# Patient Record
Sex: Female | Born: 1940 | Race: Black or African American | Hispanic: No | Smoking: Former smoker
Health system: Southern US, Community
[De-identification: ages and names within clinical notes are randomized; demographics above are authoritative.]

## PROBLEM LIST (undated history)

## (undated) DIAGNOSIS — R51 Headache: Secondary | ICD-10-CM

## (undated) DIAGNOSIS — D631 Anemia in chronic kidney disease: Secondary | ICD-10-CM

## (undated) DIAGNOSIS — R42 Dizziness and giddiness: Secondary | ICD-10-CM

## (undated) DIAGNOSIS — I160 Hypertensive urgency: Secondary | ICD-10-CM

## (undated) DIAGNOSIS — I639 Cerebral infarction, unspecified: Secondary | ICD-10-CM

## (undated) DIAGNOSIS — N183 Chronic kidney disease, stage 3 unspecified: Secondary | ICD-10-CM

## (undated) DIAGNOSIS — E1165 Type 2 diabetes mellitus with hyperglycemia: Secondary | ICD-10-CM

## (undated) DIAGNOSIS — I200 Unstable angina: Secondary | ICD-10-CM

## (undated) DIAGNOSIS — G459 Transient cerebral ischemic attack, unspecified: Secondary | ICD-10-CM

## (undated) DIAGNOSIS — R519 Headache, unspecified: Secondary | ICD-10-CM

## (undated) DIAGNOSIS — E669 Obesity, unspecified: Secondary | ICD-10-CM

## (undated) DIAGNOSIS — E1129 Type 2 diabetes mellitus with other diabetic kidney complication: Secondary | ICD-10-CM

## (undated) DIAGNOSIS — C801 Malignant (primary) neoplasm, unspecified: Secondary | ICD-10-CM

## (undated) DIAGNOSIS — N189 Chronic kidney disease, unspecified: Secondary | ICD-10-CM

## (undated) DIAGNOSIS — I679 Cerebrovascular disease, unspecified: Secondary | ICD-10-CM

## (undated) DIAGNOSIS — E7800 Pure hypercholesterolemia, unspecified: Secondary | ICD-10-CM

## (undated) DIAGNOSIS — IMO0002 Reserved for concepts with insufficient information to code with codable children: Secondary | ICD-10-CM

## (undated) DIAGNOSIS — I10 Essential (primary) hypertension: Secondary | ICD-10-CM

## (undated) DIAGNOSIS — Z789 Other specified health status: Secondary | ICD-10-CM

## (undated) DIAGNOSIS — K219 Gastro-esophageal reflux disease without esophagitis: Secondary | ICD-10-CM

## (undated) DIAGNOSIS — E039 Hypothyroidism, unspecified: Secondary | ICD-10-CM

## (undated) HISTORY — PX: CYST EXCISION: SHX5701

## (undated) HISTORY — PX: EYE SURGERY: SHX253

## (undated) HISTORY — PX: COLONOSCOPY: SHX174

## (undated) HISTORY — PX: CARDIAC CATHETERIZATION: SHX172

## (undated) HISTORY — PX: HERNIA REPAIR: SHX51

---

## 2000-08-21 ENCOUNTER — Ambulatory Visit (HOSPITAL_COMMUNITY): Admission: RE | Admit: 2000-08-21 | Discharge: 2000-08-22 | Payer: Self-pay | Admitting: Ophthalmology

## 2000-08-21 ENCOUNTER — Encounter: Payer: Self-pay | Admitting: Ophthalmology

## 2000-11-06 ENCOUNTER — Encounter: Payer: Self-pay | Admitting: Ophthalmology

## 2000-11-06 ENCOUNTER — Ambulatory Visit (HOSPITAL_COMMUNITY): Admission: RE | Admit: 2000-11-06 | Discharge: 2000-11-07 | Payer: Self-pay | Admitting: Ophthalmology

## 2001-01-08 ENCOUNTER — Ambulatory Visit (HOSPITAL_COMMUNITY): Admission: RE | Admit: 2001-01-08 | Discharge: 2001-01-08 | Payer: Self-pay | Admitting: Specialist

## 2013-01-19 ENCOUNTER — Emergency Department (HOSPITAL_BASED_OUTPATIENT_CLINIC_OR_DEPARTMENT_OTHER)
Admission: EM | Admit: 2013-01-19 | Discharge: 2013-01-19 | Disposition: A | Discharge status: Home / Self Care | Payer: Medicare Other | Attending: Emergency Medicine | Admitting: Emergency Medicine

## 2013-01-19 ENCOUNTER — Emergency Department (HOSPITAL_BASED_OUTPATIENT_CLINIC_OR_DEPARTMENT_OTHER): Payer: Medicare Other

## 2013-01-19 ENCOUNTER — Encounter (HOSPITAL_BASED_OUTPATIENT_CLINIC_OR_DEPARTMENT_OTHER): Payer: Self-pay | Admitting: *Deleted

## 2013-01-19 DIAGNOSIS — I10 Essential (primary) hypertension: Secondary | ICD-10-CM | POA: Insufficient documentation

## 2013-01-19 DIAGNOSIS — R739 Hyperglycemia, unspecified: Secondary | ICD-10-CM

## 2013-01-19 DIAGNOSIS — E119 Type 2 diabetes mellitus without complications: Secondary | ICD-10-CM | POA: Insufficient documentation

## 2013-01-19 DIAGNOSIS — R5381 Other malaise: Secondary | ICD-10-CM | POA: Insufficient documentation

## 2013-01-19 DIAGNOSIS — R42 Dizziness and giddiness: Secondary | ICD-10-CM | POA: Insufficient documentation

## 2013-01-19 DIAGNOSIS — R7309 Other abnormal glucose: Secondary | ICD-10-CM | POA: Insufficient documentation

## 2013-01-19 HISTORY — DX: Dizziness and giddiness: R42

## 2013-01-19 LAB — CBC WITH DIFFERENTIAL/PLATELET
Basophils Absolute: 0 10*3/uL (ref 0.0–0.1)
Basophils Relative: 1 % (ref 0–1)
Eosinophils Absolute: 0.2 10*3/uL (ref 0.0–0.7)
Eosinophils Relative: 3 % (ref 0–5)
HCT: 34 % — ABNORMAL LOW (ref 36.0–46.0)
Hemoglobin: 11 g/dL — ABNORMAL LOW (ref 12.0–15.0)
Lymphocytes Relative: 32 % (ref 12–46)
Lymphs Abs: 2 10*3/uL (ref 0.7–4.0)
MCH: 28.1 pg (ref 26.0–34.0)
MCHC: 32.4 g/dL (ref 30.0–36.0)
MCV: 87 fL (ref 78.0–100.0)
Monocytes Absolute: 0.4 10*3/uL (ref 0.1–1.0)
Monocytes Relative: 6 % (ref 3–12)
Neutro Abs: 3.8 10*3/uL (ref 1.7–7.7)
Neutrophils Relative %: 59 % (ref 43–77)
Platelets: 226 10*3/uL (ref 150–400)
RBC: 3.91 MIL/uL (ref 3.87–5.11)
RDW: 13.7 % (ref 11.5–15.5)
WBC: 6.3 10*3/uL (ref 4.0–10.5)

## 2013-01-19 LAB — TROPONIN I: Troponin I: <0.3 ng/mL (ref <0.30)

## 2013-01-19 LAB — COMPREHENSIVE METABOLIC PANEL
ALT: 17 U/L (ref 0–35)
AST: 22 U/L (ref 0–37)
Albumin: 3.4 g/dL — ABNORMAL LOW (ref 3.5–5.2)
Alkaline Phosphatase: 116 U/L (ref 39–117)
BUN: 32 mg/dL — ABNORMAL HIGH (ref 6–23)
CO2: 24 mEq/L (ref 19–32)
Calcium: 9.8 mg/dL (ref 8.4–10.5)
Chloride: 103 mEq/L (ref 96–112)
Creatinine, Ser: 1.5 mg/dL — ABNORMAL HIGH (ref 0.50–1.10)
GFR calc Af Amer: 39 mL/min — ABNORMAL LOW (ref >90)
GFR calc non Af Amer: 34 mL/min — ABNORMAL LOW (ref >90)
Glucose, Bld: 255 mg/dL — ABNORMAL HIGH (ref 70–99)
Potassium: 4.2 mEq/L (ref 3.5–5.1)
Sodium: 138 mEq/L (ref 135–145)
Total Bilirubin: 0.4 mg/dL (ref 0.3–1.2)
Total Protein: 7.4 g/dL (ref 6.0–8.3)

## 2013-01-19 LAB — GLUCOSE, CAPILLARY

## 2013-01-19 MED ORDER — MECLIZINE HCL 25 MG PO TABS
25.0000 mg | ORAL_TABLET | Freq: Once | ORAL | Status: AC
  Administered 2013-01-19: 25 mg via ORAL
  Filled 2013-01-19: qty 1

## 2013-01-19 MED ORDER — MECLIZINE HCL 25 MG PO TABS: 25.0000 mg | ORAL_TABLET | Freq: Three times a day (TID) | ORAL | Status: DC | PRN

## 2013-01-19 NOTE — ED Notes (Signed)
Obtained Arterial Stick for Lab Draw per MD.  Right Arterial Artery used x 1 attempt.  Compression held till no bleeding. Pt tolerated well.  No complications noted.

## 2013-01-19 NOTE — ED Notes (Signed)
Patient states she has been feeling dizzy over the past year, has been taking meclizine but is currently out of the medicine.States she has felt dizzy over the past few days

## 2013-01-19 NOTE — ED Provider Notes (Signed)
CSN: [id number]     Arrival date & time 01/19/13  4098 History   First MD Initiated Contact with Patient 01/19/13 [phone]     Chief Complaint  Patient presents with  . Dizziness   (Consider location/radiation/quality/duration/timing/severity/associated sxs/prior Treatment) HPI Comments: Patient is a 72 year old female with past medical history significant for hypertension, diabetes. She presents with complaints of feeling dizzy for the past year. She states she was seen at that time in [place] and was told she may have had a mini stroke. She has been taking meclizine off and on since that time. She states that the dizziness became worse several days ago and this morning felt as if she was going to pass out. She denies having experienced any chest pain or shortness of breath. She denies headache or stiff neck. There've been no fevers, chills.  Patient is a 72 y.o. female presenting with weakness. The history is provided by the patient.  Weakness This is a new problem. The current episode started more than 1 week ago. The problem occurs constantly. The problem has been gradually worsening. Pertinent negatives include no chest pain and no shortness of breath. Nothing aggravates the symptoms. Nothing relieves the symptoms. She has tried nothing for the symptoms. The treatment provided no relief.    Past Medical History  Diagnosis Date  . Hypertension   . Diabetes mellitus without complication    No past surgical history on file. No family history on file. History  Substance Use Topics  . Smoking status: Not on file  . Smokeless tobacco: Not on file  . Alcohol Use: Not on file   OB History   Grav Para Term Preterm Abortions TAB SAB Ect Mult Living                 Review of Systems  Respiratory: Negative for shortness of breath.   Cardiovascular: Negative for chest pain.  Neurological: Positive for weakness.  All other systems reviewed and are negative.    Allergies  Review of  patient's allergies indicates not on file.  Home Medications  No current outpatient prescriptions on file. There were no vitals taken for this visit. Physical Exam  Nursing note and vitals reviewed. Constitutional: She is oriented to person, place, and time. She appears well-developed and well-nourished. No distress.  HENT:  Head: Normocephalic and atraumatic.  Eyes: EOM are normal. Pupils are equal, round, and reactive to light.  Neck: Normal range of motion. Neck supple.  Cardiovascular: Normal rate and regular rhythm.  Exam reveals no gallop and no friction rub.   No murmur heard. Pulmonary/Chest: Effort normal and breath sounds normal. No respiratory distress. She has no wheezes.  Abdominal: Soft. Bowel sounds are normal. She exhibits no distension. There is no tenderness.  Musculoskeletal: Normal range of motion.  Lymphadenopathy:    She has no cervical adenopathy.  Neurological: She is alert and oriented to person, place, and time. No cranial nerve deficit. She exhibits normal muscle tone. Coordination normal.  Skin: Skin is warm and dry. She is not diaphoretic.    ED Course  Procedures (including critical care time) Labs Review Labs Reviewed - No data to display Imaging Review No results found.   Date: 01/19/2013  Rate: 78  Rhythm: normal sinus rhythm  QRS Axis: left  Intervals: normal  ST/T Wave abnormalities: nonspecific T wave changes  Conduction Disutrbances:none  Narrative Interpretation:   Old EKG Reviewed: changes noted    MDM  No diagnosis found. Patient presents to the  emergency department with complaints of dizziness off and on for the past. She's been on meclizine but is out of this. Workup today reveals no significant abnormalities in her blood work. Her EKG and troponin are unremarkable. Physical examination is unremarkable she is neurologically intact. Her vital signs do reveal an elevated blood pressure however she has not taken her home medications  this morning prior to coming here. Her systolic pressure is high however there is no evidence of endorgan damage and I feel as though it is appropriate for her to take her home medications and monitor this. I doubt her hypertension is the cause of her symptoms, more likely the result of. She will be discharged to home with prescription for meclizine and is to return when necessary if her symptoms worsen or change    Geoffery Lyons, MD 01/19/13 1200

## 2013-10-29 HISTORY — PX: CARDIOVASCULAR STRESS TEST: SHX262

## 2013-12-14 ENCOUNTER — Emergency Department (HOSPITAL_BASED_OUTPATIENT_CLINIC_OR_DEPARTMENT_OTHER): Payer: Medicare Other

## 2013-12-14 ENCOUNTER — Encounter (HOSPITAL_BASED_OUTPATIENT_CLINIC_OR_DEPARTMENT_OTHER): Payer: Self-pay | Admitting: Emergency Medicine

## 2013-12-14 ENCOUNTER — Observation Stay (HOSPITAL_BASED_OUTPATIENT_CLINIC_OR_DEPARTMENT_OTHER)
Admission: EM | Admit: 2013-12-14 | Discharge: 2013-12-17 | Disposition: A | Discharge status: Home / Self Care | Payer: Medicare Other | Attending: Emergency Medicine | Admitting: Emergency Medicine

## 2013-12-14 DIAGNOSIS — I129 Hypertensive chronic kidney disease with stage 1 through stage 4 chronic kidney disease, or unspecified chronic kidney disease: Secondary | ICD-10-CM | POA: Diagnosis not present

## 2013-12-14 DIAGNOSIS — I119 Hypertensive heart disease without heart failure: Secondary | ICD-10-CM

## 2013-12-14 DIAGNOSIS — N183 Chronic kidney disease, stage 3 unspecified: Secondary | ICD-10-CM

## 2013-12-14 DIAGNOSIS — E039 Hypothyroidism, unspecified: Secondary | ICD-10-CM | POA: Diagnosis present

## 2013-12-14 DIAGNOSIS — I200 Unstable angina: Secondary | ICD-10-CM | POA: Diagnosis present

## 2013-12-14 DIAGNOSIS — E785 Hyperlipidemia, unspecified: Secondary | ICD-10-CM | POA: Insufficient documentation

## 2013-12-14 DIAGNOSIS — I160 Hypertensive urgency: Secondary | ICD-10-CM

## 2013-12-14 DIAGNOSIS — E119 Type 2 diabetes mellitus without complications: Secondary | ICD-10-CM | POA: Diagnosis not present

## 2013-12-14 DIAGNOSIS — N189 Chronic kidney disease, unspecified: Secondary | ICD-10-CM

## 2013-12-14 DIAGNOSIS — E1129 Type 2 diabetes mellitus with other diabetic kidney complication: Secondary | ICD-10-CM

## 2013-12-14 DIAGNOSIS — E7800 Pure hypercholesterolemia, unspecified: Secondary | ICD-10-CM

## 2013-12-14 DIAGNOSIS — D631 Anemia in chronic kidney disease: Secondary | ICD-10-CM

## 2013-12-14 DIAGNOSIS — R072 Precordial pain: Secondary | ICD-10-CM

## 2013-12-14 DIAGNOSIS — E1165 Type 2 diabetes mellitus with hyperglycemia: Secondary | ICD-10-CM

## 2013-12-14 DIAGNOSIS — I10 Essential (primary) hypertension: Secondary | ICD-10-CM | POA: Diagnosis present

## 2013-12-14 DIAGNOSIS — R0602 Shortness of breath: Secondary | ICD-10-CM

## 2013-12-14 DIAGNOSIS — IMO0002 Reserved for concepts with insufficient information to code with codable children: Secondary | ICD-10-CM

## 2013-12-14 DIAGNOSIS — R079 Chest pain, unspecified: Secondary | ICD-10-CM | POA: Diagnosis not present

## 2013-12-14 HISTORY — DX: Hypertensive urgency: I16.0

## 2013-12-14 HISTORY — DX: Obesity, unspecified: E66.9

## 2013-12-14 HISTORY — DX: Type 2 diabetes mellitus with other diabetic kidney complication: E11.29

## 2013-12-14 HISTORY — DX: Cerebrovascular disease, unspecified: I67.9

## 2013-12-14 HISTORY — DX: Chronic kidney disease, unspecified: D63.1

## 2013-12-14 HISTORY — DX: Unstable angina: I20.0

## 2013-12-14 HISTORY — DX: Chronic kidney disease, stage 3 (moderate): N18.3

## 2013-12-14 HISTORY — DX: Other specified health status: Z78.9

## 2013-12-14 HISTORY — DX: Pure hypercholesterolemia, unspecified: E78.00

## 2013-12-14 HISTORY — DX: Transient cerebral ischemic attack, unspecified: G45.9

## 2013-12-14 HISTORY — DX: Essential (primary) hypertension: I10

## 2013-12-14 HISTORY — DX: Hypothyroidism, unspecified: E03.9

## 2013-12-14 HISTORY — DX: Chronic kidney disease, stage 3 unspecified: N18.30

## 2013-12-14 HISTORY — DX: Chronic kidney disease, unspecified: N18.9

## 2013-12-14 HISTORY — DX: Reserved for concepts with insufficient information to code with codable children: IMO0002

## 2013-12-14 HISTORY — DX: Type 2 diabetes mellitus with hyperglycemia: E11.65

## 2013-12-14 HISTORY — DX: Gastro-esophageal reflux disease without esophagitis: K21.9

## 2013-12-14 LAB — BASIC METABOLIC PANEL
Anion gap: 12 (ref 5–15)
BUN: 25 mg/dL — AB (ref 6–23)
CALCIUM: 9.9 mg/dL (ref 8.4–10.5)
CO2: 25 meq/L (ref 19–32)
Chloride: 106 mEq/L (ref 96–112)
Creatinine, Ser: 1.7 mg/dL — ABNORMAL HIGH (ref 0.50–1.10)
GFR calc Af Amer: 33 mL/min — ABNORMAL LOW (ref >90)
GFR calc non Af Amer: 29 mL/min — ABNORMAL LOW (ref >90)
GLUCOSE: 123 mg/dL — AB (ref 70–99)
Potassium: 4.3 mEq/L (ref 3.7–5.3)
Sodium: 143 mEq/L (ref 137–147)

## 2013-12-14 LAB — GLUCOSE, CAPILLARY
GLUCOSE-CAPILLARY: 117 mg/dL — AB (ref 70–99)
Glucose-Capillary: 83 mg/dL (ref 70–99)
Glucose-Capillary: 223 mg/dL — ABNORMAL HIGH (ref 70–99)

## 2013-12-14 LAB — TROPONIN I
Troponin I: <0.3 ng/mL (ref <0.30)
Troponin I: <0.3 ng/mL (ref <0.30)
Troponin I: <0.3 ng/mL (ref <0.30)

## 2013-12-14 LAB — CBC
HCT: 34.3 % — ABNORMAL LOW (ref 36.0–46.0)
Hemoglobin: 11 g/dL — ABNORMAL LOW (ref 12.0–15.0)
MCH: 28 pg (ref 26.0–34.0)
MCHC: 32.1 g/dL (ref 30.0–36.0)
MCV: 87.3 fL (ref 78.0–100.0)
Platelets: 276 10*3/uL (ref 150–400)
RBC: 3.93 MIL/uL (ref 3.87–5.11)
RDW: 13.8 % (ref 11.5–15.5)
WBC: 5.8 10*3/uL (ref 4.0–10.5)

## 2013-12-14 MED ORDER — LISINOPRIL 20 MG PO TABS
20.0000 mg | ORAL_TABLET | Freq: Every day | ORAL | Status: DC
  Administered 2013-12-14 – 2013-12-17 (×4): 20 mg via ORAL
  Filled 2013-12-14 (×4): qty 1

## 2013-12-14 MED ORDER — LEVOTHYROXINE SODIUM 88 MCG PO TABS
88.0000 ug | ORAL_TABLET | Freq: Every day | ORAL | Status: DC
  Administered 2013-12-15 – 2013-12-17 (×3): 88 ug via ORAL
  Filled 2013-12-14 (×4): qty 1

## 2013-12-14 MED ORDER — ASPIRIN 81 MG PO CHEW
324.0000 mg | CHEWABLE_TABLET | Freq: Once | ORAL | Status: AC
  Administered 2013-12-14: 324 mg via ORAL

## 2013-12-14 MED ORDER — ONDANSETRON HCL 4 MG PO TABS: 4.0000 mg | ORAL_TABLET | Freq: Four times a day (QID) | ORAL | Status: DC | PRN

## 2013-12-14 MED ORDER — AMLODIPINE BESYLATE 10 MG PO TABS
10.0000 mg | ORAL_TABLET | Freq: Every day | ORAL | Status: DC
  Administered 2013-12-14 – 2013-12-17 (×4): 10 mg via ORAL
  Filled 2013-12-14 (×5): qty 1

## 2013-12-14 MED ORDER — INSULIN ASPART 100 UNIT/ML ~~LOC~~ SOLN
0.0000 [IU] | Freq: Three times a day (TID) | SUBCUTANEOUS | Status: DC
  Administered 2013-12-15: 09:00:00 via SUBCUTANEOUS
  Administered 2013-12-15: 2 [IU] via SUBCUTANEOUS
  Administered 2013-12-16: 3 [IU] via SUBCUTANEOUS
  Administered 2013-12-17: 1 [IU] via SUBCUTANEOUS

## 2013-12-14 MED ORDER — MORPHINE SULFATE 2 MG/ML IJ SOLN: 1.0000 mg | INTRAMUSCULAR | Status: DC | PRN

## 2013-12-14 MED ORDER — ATORVASTATIN CALCIUM 20 MG PO TABS
20.0000 mg | ORAL_TABLET | Freq: Every day | ORAL | Status: DC
  Administered 2013-12-14 – 2013-12-16 (×3): 20 mg via ORAL
  Filled 2013-12-14 (×4): qty 1

## 2013-12-14 MED ORDER — ACETAMINOPHEN 650 MG RE SUPP: 650.0000 mg | Freq: Four times a day (QID) | RECTAL | Status: DC | PRN

## 2013-12-14 MED ORDER — ASPIRIN 81 MG PO CHEW
CHEWABLE_TABLET | ORAL | Status: AC
  Filled 2013-12-14: qty 4

## 2013-12-14 MED ORDER — INSULIN GLARGINE 100 UNIT/ML ~~LOC~~ SOLN
10.0000 [IU] | Freq: Every day | SUBCUTANEOUS | Status: DC
  Administered 2013-12-14 – 2013-12-16 (×3): 10 [IU] via SUBCUTANEOUS
  Filled 2013-12-14 (×3): qty 0.1

## 2013-12-14 MED ORDER — ASPIRIN EC 325 MG PO TBEC
325.0000 mg | DELAYED_RELEASE_TABLET | Freq: Every day | ORAL | Status: DC
  Administered 2013-12-15 – 2013-12-16 (×2): 325 mg via ORAL
  Filled 2013-12-14 (×2): qty 1

## 2013-12-14 MED ORDER — INSULIN ASPART 100 UNIT/ML ~~LOC~~ SOLN
0.0000 [IU] | Freq: Every day | SUBCUTANEOUS | Status: DC
  Administered 2013-12-14: 2 [IU] via SUBCUTANEOUS

## 2013-12-14 MED ORDER — ONDANSETRON HCL 4 MG/2ML IJ SOLN: 4.0000 mg | Freq: Four times a day (QID) | INTRAMUSCULAR | Status: DC | PRN

## 2013-12-14 MED ORDER — NITROGLYCERIN 0.4 MG SL SUBL
0.4000 mg | SUBLINGUAL_TABLET | SUBLINGUAL | Status: AC | PRN
Start: 2013-12-14 — End: 2013-12-14
  Administered 2013-12-14 (×3): 0.4 mg via SUBLINGUAL
  Filled 2013-12-14: qty 1

## 2013-12-14 MED ORDER — ALBUTEROL SULFATE (2.5 MG/3ML) 0.083% IN NEBU: 2.5000 mg | INHALATION_SOLUTION | RESPIRATORY_TRACT | Status: DC | PRN

## 2013-12-14 MED ORDER — ACETAMINOPHEN 325 MG PO TABS: 650.0000 mg | ORAL_TABLET | Freq: Four times a day (QID) | ORAL | Status: DC | PRN

## 2013-12-14 MED ORDER — AMLODIPINE BESYLATE 5 MG PO TABS
5.0000 mg | ORAL_TABLET | Freq: Every day | ORAL | Status: DC
  Filled 2013-12-14: qty 1

## 2013-12-14 MED ORDER — ASPIRIN EC 325 MG PO TBEC: 325.0000 mg | DELAYED_RELEASE_TABLET | Freq: Every day | ORAL | Status: DC

## 2013-12-14 MED ORDER — ENOXAPARIN SODIUM 40 MG/0.4ML ~~LOC~~ SOLN
40.0000 mg | SUBCUTANEOUS | Status: DC
  Filled 2013-12-14 (×3): qty 0.4

## 2013-12-14 MED ORDER — NITROGLYCERIN 0.4 MG SL SUBL: 0.4000 mg | SUBLINGUAL_TABLET | SUBLINGUAL | Status: DC | PRN

## 2013-12-14 NOTE — ED Notes (Signed)
MD at bedside. 

## 2013-12-14 NOTE — Progress Notes (Signed)
Spoke with Dr. Regenia Skeeter about admitting pt from [facility]  73 year old female presented to emergency department with intermittent chest discomfort with radiation to the neck and associated left arm numbness. The patient has had intermittent chest discomfort for several weeks prior to today. The patient has a history of hyperlipidemia, hypertension, diabetes mellitus. EKG revealed inverted T waves in I, aVL. Initial blood pressure upon presentation was 227/100 which improved to 141/107 with nitroglycerin. Initial troponin was negative. Chest x-ray was negative. The nitroglycerin also relieved her chest discomfort. Due to the patient's multiple risk factors and presentation, admission was requested. The patient was accepted for telemetry bed.  Teresa Flowers [phone]

## 2013-12-14 NOTE — ED Notes (Signed)
Patient here with ongoing chest heaviness with radiation to left arm x 1 month. Reports that she got upset with her spouse this am, appears slightly anxious. Has not had her meds this am. No associated symptoms with the CP today.

## 2013-12-14 NOTE — Consult Note (Signed)
Primary Care Physician: Glendon Axe, MD Referring Physician:  [name] is a 73 y.o. female with a h/o HTN, HL, and diabetes who presents with symptoms of chest pain.  She reports having intermittent chest discomfort for about a year.  She describes a "catching" sensation in her chest of moderate intensity at rest, typically lasting several minutes but occasionally up to an hour.  She denies exertional chest pain but does have occasional SOB.  She also has occasional arm/ shoulder pain and today had some numbness in her L arm.  She reports having a myoview at [facility] "a month or so ago" to evaluate these symptoms but has not heard results yet.  She denies diaphoresis, nausea or vomiting.   Today, she denies symptoms of palpitations, orthopnea, PND, lower extremity edema, dizziness, presyncope, syncope, or neurologic sequela. The patient is tolerating medications without difficulties and is otherwise without complaint today.   Past Medical History  Diagnosis Date  . Essential hypertension   . Vertigo   . Hypothyroid   . High cholesterol   . Type 2 diabetes mellitus, uncontrolled, with renal complications   . Anemia in chronic kidney disease   . Stage III chronic kidney disease   . Cerebrovascular disease     prior infarcts seen on head CT per patient  . Obesity    History reviewed. No pertinent past surgical history.  Current Facility-Administered Medications  Medication Dose Route Frequency Provider Last Rate Last Dose  . acetaminophen (TYLENOL) tablet 650 mg  650 mg Oral Q6H PRN Modena Jansky, MD       Or  . acetaminophen (TYLENOL) suppository 650 mg  650 mg Rectal Q6H PRN Modena Jansky, MD      . albuterol (PROVENTIL) (2.5 MG/3ML) 0.083% nebulizer solution 2.5 mg  2.5 mg Nebulization Q2H PRN Modena Jansky, MD      . amLODipine (NORVASC) tablet 5 mg  5 mg Oral Daily Modena Jansky, MD      . Derrill Memo ON 12/15/2013] aspirin EC tablet 325 mg  325 mg Oral  Daily Modena Jansky, MD      . atorvastatin (LIPITOR) tablet 20 mg  20 mg Oral q1800 Modena Jansky, MD      . enoxaparin (LOVENOX) injection 40 mg  40 mg Subcutaneous Q24H Modena Jansky, MD      . insulin aspart (novoLOG) injection 0-5 Units  0-5 Units Subcutaneous QHS Modena Jansky, MD      . insulin aspart (novoLOG) injection 0-9 Units  0-9 Units Subcutaneous TID WC Modena Jansky, MD      . insulin glargine (LANTUS) injection 10 Units  10 Units Subcutaneous QHS Modena Jansky, MD      . Derrill Memo ON 12/15/2013] levothyroxine (SYNTHROID, LEVOTHROID) tablet 88 mcg  88 mcg Oral QAC breakfast Modena Jansky, MD      . lisinopril (PRINIVIL,ZESTRIL) tablet 20 mg  20 mg Oral Daily Modena Jansky, MD      . morphine 2 MG/ML injection 1 mg  1 mg Intravenous Q4H PRN Modena Jansky, MD      . nitroGLYCERIN (NITROSTAT) SL tablet 0.4 mg  0.4 mg Sublingual Q5 min PRN Modena Jansky, MD      . ondansetron (ZOFRAN) tablet 4 mg  4 mg Oral Q6H PRN Modena Jansky, MD       Or  . ondansetron (ZOFRAN) injection 4 mg  4 mg Intravenous Q6H  PRN Modena Jansky, MD        No Known Allergies  History   Social History  . Marital Status: Divorced    Spouse Name: N/A    Number of Children: N/A  . Years of Education: N/A   Occupational History  . Not on file.   Social History Main Topics  . Smoking status: Former Research scientist (life sciences)  . Smokeless tobacco: Not on file  . Alcohol Use: No  . Drug Use: No  . Sexual Activity: Not on file   Other Topics Concern  . Not on file   Social History Narrative  . No narrative on file    Family History  Problem Relation Age of Onset  . Heart disease Mother   . Transient ischemic attack Brother   . Heart attack Daughter     ROS- All systems are reviewed and negative except as per the HPI above  Physical Exam: Filed Vitals:   12/14/13 1230 12/14/13 1300 12/14/13 1330 12/14/13 1441  BP: 141/107 179/101 201/70 176/109  Pulse: 59 58 58 74  Temp:     98.2 F (36.8 C)  TempSrc:    Oral  Resp: 15 15 13 16   Height:    5\' 5"  (1.651 m)  Weight:    225 lb (102.059 kg)  SpO2: 98% 100% 100% 100%    GEN- The patient is overweight appearing, alert and oriented x 3 today.   Head- normocephalic, atraumatic Eyes-  Sclera clear, conjunctiva pink Ears- hearing intact Oropharynx- clear Neck- supple  Lungs- Clear to ausculation bilaterally, normal work of breathing Heart- Regular rate and rhythm, no murmurs, rubs or gallops, PMI not laterally displaced GI- soft, NT, ND, + BS Extremities- no clubbing, cyanosis, or edema MS- no significant deformity or atrophy Skin- no rash or lesion Psych- euthymic mood, full affect Neuro- strength and sensation are intact  EKG today reveals sinus rhythm 57 bpm, PR 200, QRS 90, LVH with LAD, no ischemic changes  Assessment and Plan:  1. Chest pain/ SOB The patient has both typical and atypical symptoms.  She does have multiple CAD risk factors including HTN, HL, DM, prior tobacco, family hx, sedentary lifestyle, and cerebrovascular disease.  She has renal failure also. She had a myoview within the past month.  I have asked the ward clerk to obtain records from [facility] regarding her recent myoview.  This may help [place] better risk stratify the patient. Will cycle cardiac markers on telemetry overnight.  I do not feel that anticoagulation is presently required as she is pain free and ekg is low risk.  IF symptoms recur then heparin could be initiated at that time.  Further decisions regarding risk stratification are deferred at this time (until recent myoview results are available).  Will treat with aggressive medical therapy in the interim.  2. 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  Initiation  of nitrates would also be reasonable given chest pain.   Echo to evaluate given LVH and SOB  3. Acute on chronic renal failure Gentle hydration Aggressive blood pressure control would be beneficial Continue ace inhibitor given DM, HTN and renal disease  4. HL Resume statin  5. DM Per primary team  Cardiology to follow while here

## 2013-12-14 NOTE — H&P (Signed)
History and Physical  Teresa Flowers JJO:[id number] DOB: 1940-05-03 DOA: 12/14/2013  Referring physician: EDP PCP: Glendon Axe, MD  Outpatient Specialists:  1. None  Chief Complaint: Chest pain  HPI: Teresa Flowers is a 73 y.o. female, retired Brewing technologist, with history of type II DM/IDDM with renal complications, hypertension, dyslipidemia, TIA, stage III chronic kidney disease, anemia, vertigo, strong family history of heart disease and diabetes, former smoker, transferred from [facility] to [facility] for complaints of chest pain. Patient states that she has been having left upper chest pains radiating intermittently to the left side of neck for almost a year. However over the last 1 week, she gives h/o daily chest pains, mostly at rest and this morning when she woke up she also noticed "funny sensation in her left upper extremity". Chest pains are described as pressure-like/soreness, moderate intensity without significant relieving or aggravating factors. She gives history of dyspnea on exertion. Denies diaphoresis, nausea or vomiting. Her 75 year old daughter recently had a heart attack and underwent stent. Patient was concerned of a similar situation and presented to the ED. In the ED, troponin x1 negative, chest x-ray without acute findings but EKG showed new T-wave inversions. Blood pressure was markedly elevated which patient attributed to an argument with her spouse but she has not taken her medications today either. Currently without chest pain. Hospitalist admission requested.   Review of Systems: All systems reviewed and apart from history of presenting illness, are negative.  Past Medical History  Diagnosis Date  . Essential hypertension   . Vertigo   . Hypothyroid   . High cholesterol   . Type 2 diabetes mellitus, uncontrolled, with renal complications   . Anemia in chronic kidney disease   . Stage III chronic kidney disease    History  reviewed. No pertinent past surgical history. Social History:  reports that she has quit smoking. She does not have any smokeless tobacco history on file. She reports that she does not drink alcohol or use illicit drugs. Married. Independent of activities of daily living.  No Known Allergies  Family History  Problem Relation Age of Onset  . Heart disease Mother   . Transient ischemic attack Brother   . Heart attack Daughter     Prior to Admission medications   Medication Sig Start Date End Date Taking? Authorizing Provider  amLODipine (NORVASC) 5 MG tablet Take 5 mg by mouth daily.   Yes Historical Provider, MD  Cholecalciferol (VITAMIN D PO) Take by mouth.   Yes Historical Provider, MD  insulin glargine (LANTUS) 100 UNIT/ML injection Inject 10 Units into the skin at bedtime.    Yes Historical Provider, MD  insulin lispro (HUMALOG) 100 UNIT/ML injection Inject 2-6 Units into the skin 3 (three) times daily before meals.    Yes Historical Provider, MD  levothyroxine (SYNTHROID, LEVOTHROID) 88 MCG tablet Take 88 mcg by mouth daily before breakfast.   Yes Historical Provider, MD  lisinopril (PRINIVIL,ZESTRIL) 20 MG tablet Take 20 mg by mouth daily.   Yes Historical Provider, MD  meclizine (ANTIVERT) 25 MG tablet Take 25 mg by mouth 3 (three) times daily as needed for dizziness.   Yes Historical Provider, MD  rosuvastatin (CRESTOR) 10 MG tablet Take 10 mg by mouth daily.   Yes Historical Provider, MD   Physical Exam: Filed Vitals:   12/14/13 1230 12/14/13 1300 12/14/13 1330 12/14/13 1441  BP: 141/107 179/101 201/70 176/109  Pulse: 59 58 58 74  Temp:  98.2 F (36.8 C)  TempSrc:    Oral  Resp: 15 15 13 16   Height:    5\' 5"  (1.651 m)  Weight:    102.059 kg (225 lb)  SpO2: 98% 100% 100% 100%     General exam: Moderately built and nourished pleasant elderly female patient, lying comfortably supine in bed in no obvious distress.  Head, eyes and ENT: Nontraumatic and normocephalic.  Pupils equally reacting to light and accommodation. Oral mucosa moist.  Neck: Supple. No JVD, carotid bruit or thyromegaly.  Lymphatics: No lymphadenopathy.  Respiratory system: Clear to auscultation. No increased work of breathing. Chest pain not definitely reproducible at this time.  Cardiovascular system: S1 and S2 heard, RRR. No JVD, murmurs, gallops, clicks or pedal edema.  Gastrointestinal system: Abdomen is nondistended, soft and nontender. Normal bowel sounds heard. No organomegaly or masses appreciated.  Central nervous system: Alert and oriented. No focal neurological deficits.  Extremities: Symmetric 5 x 5 power. Peripheral pulses symmetrically felt.   Skin: No rashes or acute findings.  Musculoskeletal system: Negative exam.  Psychiatry: Pleasant and cooperative.   Labs on Admission:  Basic Metabolic Panel:  Recent Labs Lab 12/14/13 1120  NA 143  K 4.3  CL 106  CO2 25  GLUCOSE 123*  BUN 25*  CREATININE 1.70*  CALCIUM 9.9   Liver Function Tests: No results found for this basename: AST, ALT, ALKPHOS, BILITOT, PROT, ALBUMIN,  in the last 168 hours No results found for this basename: LIPASE, AMYLASE,  in the last 168 hours No results found for this basename: AMMONIA,  in the last 168 hours CBC:  Recent Labs Lab 12/14/13 1120  WBC 5.8  HGB 11.0*  HCT 34.3*  MCV 87.3  PLT 276   Cardiac Enzymes:  Recent Labs Lab 12/14/13 1120  TROPONINI <0.30    BNP (last 3 results) No results found for this basename: PROBNP,  in the last 8760 hours CBG:  Recent Labs Lab 12/14/13 1452  GLUCAP 83    Radiological Exams on Admission: Dg Chest 2 View  12/14/2013   CLINICAL DATA:  Chest pain  EXAM: CHEST  2 VIEW  COMPARISON:  11/03/2011  FINDINGS: The heart size and mediastinal contours are within normal limits. No acute infiltrate or pulmonary edema. Degenerative changes thoracic spine.  IMPRESSION: No active cardiopulmonary disease.   Electronically Signed    By: Lahoma Crocker M.D.   On: 12/14/2013 11:01    EKG: Unable to see EKG on Epic. As per EDP interpretation-NSR at 78 beats per minute and T waves inverted in 1 and aVL which apparently is new.  Assessment/Plan Principal Problem:   Chest pain Active Problems:   Essential hypertension   Hypothyroid   High cholesterol   Type 2 diabetes mellitus, uncontrolled, with renal complications   Anemia in chronic kidney disease   Stage III chronic kidney disease   1. Chest pain: Has typical and some atypical features for angina. Currently chest pain-free. CAD risk factors-DM, HTN, HLD, obesity, CKD, former smoker and family history. Cycle cardiac enzymes. Continue aspirin. Cardiology consulted for ischemic evaluation. 2. Uncontrolled hypertension: Has not taken medications today. Resume home medications and monitor. May require further titration. 3. Uncontrolled type II DM with renal complications: Continue home dose of Lantus. Add NovoLog SSI. Check A1c. 4. Stage III chronic kidney disease: Creatinine is slightly higher from 1.5 > 1.7 compared to September 2014. Follow BMP. 5. Anemia in chronic kidney disease: Stable. 6. Hypothyroid: Continue Synthroid  Code Status: Full  Family Communication: None at bedside  Disposition Plan: Home when medically stable   Time spent: 4 minutes  Grettel Rames, MD, FACP, FHM. [facility] Pager [phone]  If 7PM-7AM, please contact night-coverage [email or web address] Password [facility] 12/14/2013, 4:32 PM

## 2013-12-14 NOTE — ED Provider Notes (Signed)
CSN: [id number]     Arrival date & time 12/14/13  1941 History   First MD Initiated Contact with Patient 12/14/13 [phone]     Chief Complaint  Patient presents with  . Chest Pain     (Consider location/radiation/quality/duration/timing/severity/associated sxs/prior Treatment) HPI 73 year old female presents with chest pain. Patient is an overall poor historian and seems indicated she's been having chest pain for quite some time. She indicates that it's been going on at least a month. Does not happen every day. She has a general soreness in her left anterior chest that is worse with palpation. That seems to be 24/7. Intermittently 1-2 times per day she get a heaviness in her chest it seems to radiate to her neck and sometimes her arm gets numb. She had that this morning for about an hour. It ended about an hour prior to arrival. Patient states she went to get it checked out because some people are becoming concerned around her period she does endorse worse dyspnea on exertion and fatigue with walking. 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 Denies focal leg swelling or leg pain. No prior cardiac issues. She states her blood pressure is up today because she had an argument with her husband about where to find this facility. She states normally his not 200/100. Her last blood pressure she checked was at her doctor's office and the systolic was 144.  Past Medical History  Diagnosis Date  . Hypertension   . Diabetes mellitus without complication   . Vertigo   . Thyroid disease   . High cholesterol    History reviewed. No pertinent past surgical history. No family history on file. History  Substance Use Topics  . Smoking status: Former Research scientist (life sciences)  . Smokeless tobacco: Not on file  . Alcohol Use: No   OB History   Grav Para Term Preterm Abortions TAB SAB Ect Mult Living                 Review of Systems  Constitutional: Negative for fever.   Respiratory: Positive for shortness of breath.   Cardiovascular: Positive for chest pain. Negative for leg swelling.  Gastrointestinal: Negative for nausea and vomiting.  Musculoskeletal: Positive for neck pain.  Neurological: Positive for numbness.  All other systems reviewed and are negative.     Allergies  Review of patient's allergies indicates no known allergies.  Home Medications   Prior to Admission medications   Medication Sig Start Date End Date Taking? Authorizing Provider  insulin glargine (LANTUS) 100 UNIT/ML injection Inject into the skin at bedtime.   Yes Historical Provider, MD  AMLODIPINE BESYLATE PO Take by mouth.    Historical Provider, MD  Cholecalciferol (VITAMIN D PO) Take by mouth.    Historical Provider, MD  insulin lispro (HUMALOG) 100 UNIT/ML injection Inject into the skin 3 (three) times daily before meals.    Historical Provider, MD  Levothyroxine Sodium (SYNTHROID PO) Take by mouth.    Historical Provider, MD  LISINOPRIL PO Take by mouth.    Historical Provider, MD  meclizine (ANTIVERT) 25 MG tablet Take 1 tablet (25 mg total) by mouth 3 (three) times daily as needed. 01/19/13   Veryl Speak, MD  MECLIZINE HCL PO Take by mouth.    Historical Provider, MD   BP 227/100  Pulse 71  Temp(Src) 99.3 F (37.4 C) (Oral)  Resp 20  Wt 221 lb (100.245 kg)  SpO2  100% Physical Exam  Nursing note and vitals reviewed. Constitutional: She is oriented to person, place, and time. She appears well-developed and well-nourished.  HENT:  Head: Normocephalic and atraumatic.  Right Ear: External ear normal.  Left Ear: External ear normal.  Nose: Nose normal.  Eyes: Right eye exhibits no discharge. Left eye exhibits no discharge.  Cardiovascular: Normal rate, regular rhythm and normal heart sounds.   Pulmonary/Chest: Effort normal and breath sounds normal. She exhibits tenderness (left anterior chest wall tenderness).  Abdominal: Soft. There is no tenderness.   Musculoskeletal: She exhibits no edema and no tenderness.  Neurological: She is alert and oriented to person, place, and time.  Skin: Skin is warm and dry.    ED Course  Procedures (including critical care time) Labs Review Labs Reviewed  CBC - Abnormal; Notable for the following:    Hemoglobin 11.0 (*)    HCT 34.3 (*)    All other components within normal limits  BASIC METABOLIC PANEL - Abnormal; Notable for the following:    Glucose, Bld 123 (*)    BUN 25 (*)    Creatinine, Ser 1.70 (*)    GFR calc non Af Amer 29 (*)    GFR calc Af Amer 33 (*)    All other components within normal limits  TROPONIN I    Imaging Review Dg Chest 2 View  12/14/2013   CLINICAL DATA:  Chest pain  EXAM: CHEST  2 VIEW  COMPARISON:  11/03/2011  FINDINGS: The heart size and mediastinal contours are within normal limits. No acute infiltrate or pulmonary edema. Degenerative changes thoracic spine.  IMPRESSION: No active cardiopulmonary disease.   Electronically Signed   By: Lahoma Crocker M.D.   On: 12/14/2013 11:01    Date: 12/14/2013  Rate: 78  Rhythm: normal sinus rhythm  QRS Axis: normal  Intervals: normal  ST/T Wave abnormalities: T wave inversions 1, AVL  Conduction Disutrbances:none  Narrative Interpretation: T waves inverted I, AVL, previously were flat/nonspecific  Old EKG Reviewed: changes noted   MDM   Final diagnoses:  Chest pain, unspecified chest pain type    Patient's pain is somewhat atypical but she does have intermittent episodes of pressure with radiation to neck and left arm. She's quite hypertensive here, which improved with nitroglycerin. States her pain is resolved. Her EKG shows new T wave inversions in 1 and L. compared to prior borderline changes. Given her risk factors and uncontrolled hypertension will need ACS rule out and observation at [facility]. Discussed with Dr. Carles Collet, who will admit patient and accept in transfer    Ephraim Hamburger, MD 12/14/13 1345

## 2013-12-15 DIAGNOSIS — I119 Hypertensive heart disease without heart failure: Secondary | ICD-10-CM

## 2013-12-15 DIAGNOSIS — I059 Rheumatic mitral valve disease, unspecified: Secondary | ICD-10-CM

## 2013-12-15 DIAGNOSIS — I10 Essential (primary) hypertension: Secondary | ICD-10-CM

## 2013-12-15 LAB — BASIC METABOLIC PANEL
Anion gap: 12 (ref 5–15)
BUN: 25 mg/dL — ABNORMAL HIGH (ref 6–23)
CHLORIDE: 108 meq/L (ref 96–112)
CO2: 22 meq/L (ref 19–32)
Calcium: 9.3 mg/dL (ref 8.4–10.5)
Creatinine, Ser: 1.71 mg/dL — ABNORMAL HIGH (ref 0.50–1.10)
GFR calc Af Amer: 33 mL/min — ABNORMAL LOW (ref >90)
GFR calc non Af Amer: 28 mL/min — ABNORMAL LOW (ref >90)
GLUCOSE: 137 mg/dL — AB (ref 70–99)
POTASSIUM: 4.2 meq/L (ref 3.7–5.3)
Sodium: 142 mEq/L (ref 137–147)

## 2013-12-15 LAB — GLUCOSE, CAPILLARY
GLUCOSE-CAPILLARY: 146 mg/dL — AB (ref 70–99)
GLUCOSE-CAPILLARY: 181 mg/dL — AB (ref 70–99)
GLUCOSE-CAPILLARY: 102 mg/dL — AB (ref 70–99)
Glucose-Capillary: 200 mg/dL — ABNORMAL HIGH (ref 70–99)

## 2013-12-15 LAB — CBC
HCT: 31.4 % — ABNORMAL LOW (ref 36.0–46.0)
HEMOGLOBIN: 10.2 g/dL — AB (ref 12.0–15.0)
MCH: 27.9 pg (ref 26.0–34.0)
MCHC: 32.5 g/dL (ref 30.0–36.0)
MCV: 85.8 fL (ref 78.0–100.0)
Platelets: 270 10*3/uL (ref 150–400)
RBC: 3.66 MIL/uL — ABNORMAL LOW (ref 3.87–5.11)
RDW: 14 % (ref 11.5–15.5)
WBC: 5.7 10*3/uL (ref 4.0–10.5)

## 2013-12-15 LAB — HEMOGLOBIN A1C
Hgb A1c MFr Bld: 8.2 % — ABNORMAL HIGH (ref <5.7)
MEAN PLASMA GLUCOSE: 189 mg/dL — AB (ref <117)

## 2013-12-15 LAB — TROPONIN I: Troponin I: <0.3 ng/mL (ref <0.30)

## 2013-12-15 MED ORDER — SODIUM CHLORIDE 0.45 % IV SOLN
INTRAVENOUS | Status: DC
  Administered 2013-12-15: 09:00:00 via INTRAVENOUS

## 2013-12-15 NOTE — Progress Notes (Signed)
Utilization Review Completed.   Oluwanifemi Petitti, RN, BSN Nurse Case Manager  

## 2013-12-15 NOTE — Progress Notes (Signed)
Patient ID: Teresa Flowers, female   DOB: [birth year], 73 y.o.   MRN: [id number]  Primary Cardiologist Allred  Subjective:  Denies SSCP, palpitations or Dyspnea She is very concerned about her family history of CAD  Objective:  Filed Vitals:   12/14/13 1441 12/14/13 2055 12/14/13 2211 12/15/13 0551  BP: 176/109 186/110 156/72 131/62  Pulse: 74 65  61  Temp: 98.2 F (36.8 C) 98.3 F (36.8 C)  98.2 F (36.8 C)  TempSrc: Oral Oral  Oral  Resp: 16 18  18   Height: 5\' 5"  (1.651 m)     Weight: 225 lb (102.059 kg)   224 lb 13.9 oz (102 kg)  SpO2: 100% 99%  99%    Intake/Output from previous day:  Intake/Output Summary (Last 24 hours) at 12/15/13 6761 Last data filed at 12/15/13 0816  Gross per 24 hour  Intake    240 ml  Output      0 ml  Net    240 ml    Physical Exam: Affect appropriate Overweight black female HEENT: normal Neck supple with no adenopathy JVP normal no bruits no thyromegaly Lungs clear with no wheezing and good diaphragmatic motion Heart:  S1/S2 no murmur, no rub, gallop or click PMI normal Abdomen: benighn, BS positve, no tenderness, no AAA no bruit.  No HSM or HJR Distal pulses intact with no bruits No edema Neuro non-focal Skin warm and dry No muscular weakness   Lab Results: Basic Metabolic Panel:  Recent Labs  12/14/13 1120 12/15/13 0435  NA 143 142  K 4.3 4.2  CL 106 108  CO2 25 22  GLUCOSE 123* 137*  BUN 25* 25*  CREATININE 1.70* 1.71*  CALCIUM 9.9 9.3   CBC:  Recent Labs  12/14/13 1120 12/15/13 0435  WBC 5.8 5.7  HGB 11.0* 10.2*  HCT 34.3* 31.4*  MCV 87.3 85.8  PLT 276 270   Cardiac Enzymes:  Recent Labs  12/14/13 1700 12/14/13 2229 12/15/13 0435  TROPONINI <0.30 <0.30 <0.30    Imaging: Dg Chest 2 View  12/14/2013   CLINICAL DATA:  Chest pain  EXAM: CHEST  2 VIEW  COMPARISON:  11/03/2011  FINDINGS: The heart size and mediastinal contours are within normal limits. No acute infiltrate or pulmonary edema.  Degenerative changes thoracic spine.  IMPRESSION: No active cardiopulmonary disease.   Electronically Signed   By: Lahoma Crocker M.D.   On: 12/14/2013 11:01    Cardiac Studies:  ECG:  SR poor R wave progression LVH   Telemetry:  NSR no arrhythmia  Echo: pending  Medications:   . amLODipine  10 mg Oral Daily  . aspirin EC  325 mg Oral Daily  . atorvastatin  20 mg Oral q1800  . enoxaparin (LOVENOX) injection  40 mg Subcutaneous Q24H  . insulin aspart  0-5 Units Subcutaneous QHS  . insulin aspart  0-9 Units Subcutaneous TID WC  . insulin glargine  10 Units Subcutaneous QHS  . levothyroxine  88 mcg Oral QAC breakfast  . lisinopril  20 mg Oral Daily       Assessment/Plan:  Chest Pain:  See note by Dr Rayann Heman no results yet from myovue done at [facility] a month ago.  Have also asked our PA Katie to look into this although not likely to get results over weekend.  Given recurrent pain and patients concern would have low thresshold to cath.  Will make NPO and hydrate in case she is cathed latter in day Monday  Echo pending  HTN:  Improved continue ACE and amlodipine Chol:  On statin   Jenkins Rouge 12/15/2013, 9:06 AM

## 2013-12-15 NOTE — Progress Notes (Signed)
[facility] PROGRESS NOTE Assessment/Plan: Chest pain: - Atypical features for angina. - Risk factors-DM, HTN, HLD, obesity, CKD, former smoker, she was non complaint with antihypertensive. - consulted cards rec to obtain records from [place] regarding her recent myoview.  - chest pain free.Echo pending for WMA.  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 Add NovoLog SSI. Check A1c.   Stage III chronic kidney disease:  - Creatinine is at baseline (1.5-1.7).  Anemia in chronic kidney disease:  - Stable.   Hypothyroid:  - Continue Synthroid    Code Status: Full  Family Communication: None at bedside  Disposition Plan: Home when medically stable    Consultants:  cardiology  Procedures:  Echo pedning  Antibiotics:  none  HPI/Subjective: Chest pain free want to go home.  Objective: Filed Vitals:   12/14/13 1441 12/14/13 2055 12/14/13 2211 12/15/13 0551  BP: 176/109 186/110 156/72 131/62  Pulse: 74 65  61  Temp: 98.2 F (36.8 C) 98.3 F (36.8 C)  98.2 F (36.8 C)  TempSrc: Oral Oral  Oral  Resp: 16 18  18   Height: 5\' 5"  (1.651 m)     Weight: 102.059 kg (225 lb)   102 kg (224 lb 13.9 oz)  SpO2: 100% 99%  99%    Intake/Output Summary (Last 24 hours) at 12/15/13 0803 Last data filed at 12/14/13 2300  Gross per 24 hour  Intake    120 ml  Output      0 ml  Net    120 ml   Filed Weights   12/14/13 0940 12/14/13 1441 12/15/13 0551  Weight: 100.245 kg (221 lb) 102.059 kg (225 lb) 102 kg (224 lb 13.9 oz)    Exam:  General: Alert, awake, oriented x3, in no acute distress.  HEENT: No bruits, no goiter.  Heart: Regular rate and rhythm. Lungs: Good air movement, clear Abdomen: Soft, nontender, nondistended, positive bowel sounds.    Data Reviewed: Basic Metabolic Panel:  Recent Labs Lab  12/14/13 1120 12/15/13 0435  NA 143 142  K 4.3 4.2  CL 106 108  CO2 25 22  GLUCOSE 123* 137*  BUN 25* 25*  CREATININE 1.70* 1.71*  CALCIUM 9.9 9.3   Liver Function Tests: No results found for this basename: AST, ALT, ALKPHOS, BILITOT, PROT, ALBUMIN,  in the last 168 hours No results found for this basename: LIPASE, AMYLASE,  in the last 168 hours No results found for this basename: AMMONIA,  in the last 168 hours CBC:  Recent Labs Lab 12/14/13 1120 12/15/13 0435  WBC 5.8 5.7  HGB 11.0* 10.2*  HCT 34.3* 31.4*  MCV 87.3 85.8  PLT 276 270   Cardiac Enzymes:  Recent Labs Lab 12/14/13 1120 12/14/13 1700 12/14/13 2229 12/15/13 0435  TROPONINI <0.30 <0.30 <0.30 <0.30   BNP (last 3 results) No results found for this basename: PROBNP,  in the last 8760 hours CBG:  Recent Labs Lab 12/14/13 1452 12/14/13 1641 12/14/13 2052 12/15/13 0717  GLUCAP 83 117* 223* 146*    No results found for this or any previous visit (from the past 240 hour(s)).   Studies: Dg Chest 2 View  12/14/2013   CLINICAL DATA:  Chest pain  EXAM: CHEST  2 VIEW  COMPARISON:  11/03/2011  FINDINGS: The heart size and mediastinal contours are within normal limits. No acute infiltrate or pulmonary  edema. Degenerative changes thoracic spine.  IMPRESSION: No active cardiopulmonary disease.   Electronically Signed   By: Lahoma Crocker M.D.   On: 12/14/2013 11:01    Scheduled Meds: . amLODipine  10 mg Oral Daily  . aspirin EC  325 mg Oral Daily  . atorvastatin  20 mg Oral q1800  . enoxaparin (LOVENOX) injection  40 mg Subcutaneous Q24H  . insulin aspart  0-5 Units Subcutaneous QHS  . insulin aspart  0-9 Units Subcutaneous TID WC  . insulin glargine  10 Units Subcutaneous QHS  . levothyroxine  88 mcg Oral QAC breakfast  . lisinopril  20 mg Oral Daily   Continuous Infusions:    Charlynne Cousins  [facility] Pager [phone]. If 8PM-8AM, please contact night-coverage at [email or web address], password  [facility] 12/15/2013, 8:03 AM  LOS: 1 day      **Disclaimer: This note may have been dictated with voice recognition software. Similar sounding words can inadvertently be transcribed and this note may contain transcription errors which may not have been corrected upon publication of note.**

## 2013-12-15 NOTE — Progress Notes (Signed)
Echo Lab  2D Echocardiogram completed.  [place], RDCS 12/15/2013 10:39 AM

## 2013-12-16 ENCOUNTER — Encounter (HOSPITAL_COMMUNITY): Payer: Self-pay | Admitting: Cardiology

## 2013-12-16 ENCOUNTER — Encounter (HOSPITAL_COMMUNITY)
Admission: EM | Disposition: A | Discharge status: Home / Self Care | Payer: Self-pay | Source: Home / Self Care | Attending: Emergency Medicine

## 2013-12-16 DIAGNOSIS — E785 Hyperlipidemia, unspecified: Secondary | ICD-10-CM

## 2013-12-16 DIAGNOSIS — I160 Hypertensive urgency: Secondary | ICD-10-CM

## 2013-12-16 DIAGNOSIS — I200 Unstable angina: Secondary | ICD-10-CM

## 2013-12-16 HISTORY — PX: LEFT HEART CATHETERIZATION WITH CORONARY ANGIOGRAM: SHX5451

## 2013-12-16 HISTORY — DX: Hypertensive urgency: I16.0

## 2013-12-16 LAB — PROTIME-INR
INR: 0.99 (ref 0.00–1.49)
Prothrombin Time: 13.1 seconds (ref 11.6–15.2)

## 2013-12-16 LAB — GLUCOSE, CAPILLARY
GLUCOSE-CAPILLARY: 238 mg/dL — AB (ref 70–99)
Glucose-Capillary: 126 mg/dL — ABNORMAL HIGH (ref 70–99)
Glucose-Capillary: 106 mg/dL — ABNORMAL HIGH (ref 70–99)
Glucose-Capillary: 125 mg/dL — ABNORMAL HIGH (ref 70–99)

## 2013-12-16 SURGERY — LEFT HEART CATHETERIZATION WITH CORONARY ANGIOGRAM
Anesthesia: LOCAL

## 2013-12-16 MED ORDER — ENOXAPARIN SODIUM 40 MG/0.4ML ~~LOC~~ SOLN
40.0000 mg | SUBCUTANEOUS | Status: DC
  Filled 2013-12-16: qty 0.4

## 2013-12-16 MED ORDER — FENTANYL CITRATE 0.05 MG/ML IJ SOLN
INTRAMUSCULAR | Status: AC
  Filled 2013-12-16: qty 2

## 2013-12-16 MED ORDER — SODIUM CHLORIDE 0.9 % IJ SOLN: 3.0000 mL | INTRAMUSCULAR | Status: DC | PRN

## 2013-12-16 MED ORDER — HEPARIN SODIUM (PORCINE) 1000 UNIT/ML IJ SOLN
INTRAMUSCULAR | Status: AC
  Filled 2013-12-16: qty 1

## 2013-12-16 MED ORDER — ASPIRIN 81 MG PO CHEW
81.0000 mg | CHEWABLE_TABLET | Freq: Every day | ORAL | Status: DC
  Administered 2013-12-17: 81 mg via ORAL
  Filled 2013-12-16: qty 1

## 2013-12-16 MED ORDER — VERAPAMIL HCL 2.5 MG/ML IV SOLN
INTRAVENOUS | Status: AC
  Filled 2013-12-16: qty 2

## 2013-12-16 MED ORDER — LIDOCAINE HCL (PF) 1 % IJ SOLN
INTRAMUSCULAR | Status: AC
  Filled 2013-12-16: qty 30

## 2013-12-16 MED ORDER — ONDANSETRON HCL 4 MG/2ML IJ SOLN: 4.0000 mg | Freq: Four times a day (QID) | INTRAMUSCULAR | Status: DC | PRN

## 2013-12-16 MED ORDER — SODIUM CHLORIDE 0.9 % IV SOLN: INTRAVENOUS | Status: AC

## 2013-12-16 MED ORDER — NITROGLYCERIN 1 MG/10 ML FOR IR/CATH LAB
INTRA_ARTERIAL | Status: AC
  Filled 2013-12-16: qty 10

## 2013-12-16 MED ORDER — ACETAMINOPHEN 325 MG PO TABS
650.0000 mg | ORAL_TABLET | ORAL | Status: DC | PRN
Start: 2013-12-16 — End: 2013-12-17

## 2013-12-16 MED ORDER — SODIUM CHLORIDE 0.9 % IV SOLN: 250.0000 mL | INTRAVENOUS | Status: DC | PRN

## 2013-12-16 MED ORDER — SODIUM CHLORIDE 0.9 % IJ SOLN: 3.0000 mL | Freq: Two times a day (BID) | INTRAMUSCULAR | Status: DC

## 2013-12-16 MED ORDER — HEPARIN (PORCINE) IN NACL 2-0.9 UNIT/ML-% IJ SOLN
INTRAMUSCULAR | Status: AC
  Filled 2013-12-16: qty 1500

## 2013-12-16 MED ORDER — MIDAZOLAM HCL 2 MG/2ML IJ SOLN
INTRAMUSCULAR | Status: AC
  Filled 2013-12-16: qty 2

## 2013-12-16 NOTE — Progress Notes (Signed)
Right radial TR band removed without complications.  2x2 and tegaderm applied.  Radial site level 0.  Patient given radial site instructions.  Sanda Linger

## 2013-12-16 NOTE — Progress Notes (Signed)
[facility] PROGRESS NOTE Assessment/Plan: Chest pain: - Risk factors-DM, HTN, HLD, obesity, CKD, former smoker, she was non complaint with antihypertensive. - Consulted cards. appriciated assistance. - Chest pain free.EchoSystolic function was normal. The estimated ejection fraction was in the range of 55% to 60%. Wall motion was normal; there were no regional wall motion abnormalities.  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 Add NovoLog SSI. Check A1c.   Stage III chronic kidney disease:  - Creatinine is at baseline (1.5-1.7).  Anemia in chronic kidney disease:  - Stable.   Hypothyroid:  - Continue Synthroid    Code Status: Full  Family Communication: None at bedside  Disposition Plan: Home when medically stable    Consultants:  cardiology  Procedures:  Echo as above.  Antibiotics:  none  HPI/Subjective: Chest pain free.  Objective: Filed Vitals:   12/15/13 0900 12/15/13 1347 12/15/13 2100 12/16/13 0500  BP: 147/68 155/63 151/59 152/70  Pulse: 89 64 80 58  Temp:  99.2 F (37.3 C) 97.9 F (36.6 C) 98.7 F (37.1 C)  TempSrc:  Oral Oral Oral  Resp:  17 18 18   Height:      Weight:      SpO2:  100% 100% 100%    Intake/Output Summary (Last 24 hours) at 12/16/13 0813 Last data filed at 12/16/13 0653  Gross per 24 hour  Intake 1742.5 ml  Output      0 ml  Net 1742.5 ml   Filed Weights   12/14/13 0940 12/14/13 1441 12/15/13 0551  Weight: 100.245 kg (221 lb) 102.059 kg (225 lb) 102 kg (224 lb 13.9 oz)    Exam:  General: Alert, awake, oriented x3, in no acute distress.  HEENT: No bruits, no goiter.  Heart: Regular rate and rhythm. Lungs: Good air movement, clear Abdomen: Soft, nontender, nondistended, positive bowel sounds.    Data Reviewed: Basic Metabolic Panel:  Recent Labs Lab  12/14/13 1120 12/15/13 0435  NA 143 142  K 4.3 4.2  CL 106 108  CO2 25 22  GLUCOSE 123* 137*  BUN 25* 25*  CREATININE 1.70* 1.71*  CALCIUM 9.9 9.3   Liver Function Tests: No results found for this basename: AST, ALT, ALKPHOS, BILITOT, PROT, ALBUMIN,  in the last 168 hours No results found for this basename: LIPASE, AMYLASE,  in the last 168 hours No results found for this basename: AMMONIA,  in the last 168 hours CBC:  Recent Labs Lab 12/14/13 1120 12/15/13 0435  WBC 5.8 5.7  HGB 11.0* 10.2*  HCT 34.3* 31.4*  MCV 87.3 85.8  PLT 276 270   Cardiac Enzymes:  Recent Labs Lab 12/14/13 1120 12/14/13 1700 12/14/13 2229 12/15/13 0435  TROPONINI <0.30 <0.30 <0.30 <0.30   BNP (last 3 results) No results found for this basename: PROBNP,  in the last 8760 hours CBG:  Recent Labs Lab 12/15/13 0717 12/15/13 1132 12/15/13 1710 12/15/13 2049 12/16/13 0743  GLUCAP 146* 181* 102* 200* 126*    No results found for this or any previous visit (from the past 240 hour(s)).   Studies: Dg Chest 2 View  12/14/2013   CLINICAL DATA:  Chest pain  EXAM: CHEST  2 VIEW  COMPARISON:  11/03/2011  FINDINGS: The heart size and mediastinal contours are within normal limits. No acute infiltrate or pulmonary edema. Degenerative changes thoracic spine.  IMPRESSION: No  active cardiopulmonary disease.   Electronically Signed   By: Lahoma Crocker M.D.   On: 12/14/2013 11:01    Scheduled Meds: . amLODipine  10 mg Oral Daily  . aspirin EC  325 mg Oral Daily  . atorvastatin  20 mg Oral q1800  . enoxaparin (LOVENOX) injection  40 mg Subcutaneous Q24H  . insulin aspart  0-5 Units Subcutaneous QHS  . insulin aspart  0-9 Units Subcutaneous TID WC  . insulin glargine  10 Units Subcutaneous QHS  . levothyroxine  88 mcg Oral QAC breakfast  . lisinopril  20 mg Oral Daily   Continuous Infusions: . sodium chloride 75 mL/hr at 12/15/13 0915     Charlynne Cousins  [facility] Pager [phone].  If 8PM-8AM, please contact night-coverage at [email or web address], password [facility] 12/16/2013, 8:13 AM  LOS: 2 days      **Disclaimer: This note may have been dictated with voice recognition software. Similar sounding words can inadvertently be transcribed and this note may contain transcription errors which may not have been corrected upon publication of note.**

## 2013-12-16 NOTE — CV Procedure (Signed)
       PROCEDURE:  Left heart catheterization with selective coronary angiography, left ventriculogram.  INDICATIONS:  Unstable angina  The risks, benefits, and details of the procedure were explained to the patient.  The patient verbalized understanding and wanted to proceed.  Informed written consent was obtained.  PROCEDURE TECHNIQUE:  After Xylocaine anesthesia a 55F slender sheath was placed in the right radial artery with a single anterior needle wall stick.  IV heparin was given. Right coronary angiography was done using a Judkins R4 guide catheter.  Left coronary angiography was done using a Judkins L3.5 guide catheter.  Left ventriculography was done using a pigtail catheter.  A TR band was used for hemostasis.   CONTRAST:  Total of 25 cc.  COMPLICATIONS:  None.    HEMODYNAMICS:  Aortic pressure was 122/69; LV pressure was 118/4; LVEDP 7.  There was no gradient between the left ventricle and aorta.    ANGIOGRAPHIC DATA:   The left main coronary artery is short, but widely patent.  The left anterior descending artery is a large vessel which wraps around the apex. There is mild disease in the proximal to mid vessel. There several medium-size diagonals which are all widely patent.  The left circumflex artery is a large vessel. There is a large ramus vessel which is patent. The circumflex has a large first obtuse marginal. There is mild disease in the midportion of this vessel. The remainder of the circumflex is small.  The right coronary artery is a large dominant vessel. There are mild, luminal irregularities throughout the RCA. There is a focal area of moderate disease in the distal RCA. The posterior lateral artery is large and patent. The posterior descending artery is also large and patent.  LEFT VENTRICULOGRAM:  Left ventricular angiogram was not done.  LVEDP was 7 mmHg.  IMPRESSIONS:  1. Normal left main coronary artery. 2. 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 3. Mild disease in the left circumflex artery and its branches. 4. Mild disease in the right coronary artery as noted above. 5. Left ventricular systolic function was not assessed.  LVEDP 7 mmHg.    RECOMMENDATION:  Continue preventative therapy. She needs aggressive risk factor modification. Minimal contrast dye was used. We will hydrate her post procedure to help preserve renal function.  Cardiology followup with Dr. Johnsie Cancel.

## 2013-12-16 NOTE — Interval H&P Note (Signed)
Cath Lab Visit (complete for each Cath Lab visit)  Clinical Evaluation Leading to the Procedure:   ACS: Yes.    Non-ACS:    Anginal Classification: CCS IV  Anti-ischemic medical therapy: Maximal Therapy (2 or more classes of medications)  Non-Invasive Test Results: Intermediate-risk stress test findings: cardiac mortality 1-3%/year  Prior CABG: No previous CABG      History and Physical Interval Note:  12/16/2013 12:58 PM  Teresa Flowers  has presented today for surgery, with the diagnosis of cp  The various methods of treatment have been discussed with the patient and family. After consideration of risks, benefits and other options for treatment, the patient has consented to  Procedure(s): LEFT HEART CATHETERIZATION WITH CORONARY ANGIOGRAM (N/A) as a surgical intervention .  The patient's history has been reviewed, patient examined, no change in status, stable for surgery.  I have reviewed the patient's chart and labs.  Questions were answered to the patient's satisfaction.     Feliz Lincoln S.

## 2013-12-16 NOTE — H&P (View-Only) (Signed)
Subjective: No further pain- has brief episodes of chest pressure  Nuc study 10/29/13 adenosine myoview, with mildly abnormal with moderate fixed inferior attenuation artifact vs. Transmural scar.  EF 53% without regional wall motion abnormalities.   Objective: Vital signs in last 24 hours: Temp:  [97.9 F (36.6 C)-99.2 F (37.3 C)] 98.7 F (37.1 C) (08/24 0500) Pulse Rate:  [58-80] 58 (08/24 0500) Resp:  [17-18] 18 (08/24 0500) BP: (151-155)/(59-70) 152/70 mmHg (08/24 0500) SpO2:  [100 %] 100 % (08/24 0500) Weight change:  Last BM Date: 12/13/13 Intake/Output from previous day: +1742 08/23 0701 - 08/24 0700 In: 1742.5 [P.O.:120; I.V.:1622.5] Out: -  Intake/Output this shift:    PE: General:Pleasant affect, NAD Skin:Warm and dry, brisk capillary refill HEENT:normocephalic, sclera clear, mucus membranes moist Neck:supple, no JVD   Heart:S1S2 RRR without murmur, gallup, rub or click Lungs:clear without rales, rhonchi, or wheezes POE:UMPN, non tender, + BS, do not palpate liver spleen or masses Ext:no lower ext edema, 2+ pedal pulses, 2+ radial pulses Neuro:alert and oriented, MAE, follows commands, + facial symmetry   Lab Results:  Recent Labs  12/14/13 1120 12/15/13 0435  WBC 5.8 5.7  HGB 11.0* 10.2*  HCT 34.3* 31.4*  PLT 276 270   BMET  Recent Labs  12/14/13 1120 12/15/13 0435  NA 143 142  K 4.3 4.2  CL 106 108  CO2 25 22  GLUCOSE 123* 137*  BUN 25* 25*  CREATININE 1.70* 1.71*  CALCIUM 9.9 9.3    Recent Labs  12/14/13 2229 12/15/13 0435  TROPONINI <0.30 <0.30    No results found for this basename: CHOL, HDL, LDLCALC, LDLDIRECT, TRIG, CHOLHDL   Lab Results  Component Value Date   HGBA1C 8.2* 12/14/2013     No results found for this basename: TSH     Studies/Results: 2D echo: Study Conclusions - Left ventricle: Basal septal hypertrophy with sigmoid shaped septum. The cavity size was normal. Systolic function was normal. The  estimated ejection fraction was in the range of 55% to 60%. Wall motion was normal; there were no regional wall motion abnormalities. - Mitral valve: There was mild regurgitation. - Atrial septum: No defect or patent foramen ovale was identified. Impressions: - Normal study.    Dg Chest 2 View  12/14/2013   CLINICAL DATA:  Chest pain  EXAM: CHEST  2 VIEW  COMPARISON:  11/03/2011  FINDINGS: The heart size and mediastinal contours are within normal limits. No acute infiltrate or pulmonary edema. Degenerative changes thoracic spine.  IMPRESSION: No active cardiopulmonary disease.   Electronically Signed   By: Lahoma Crocker M.D.   On: 12/14/2013 11:01    Medications: I have reviewed the patient's current medications. Scheduled Meds: . amLODipine  10 mg Oral Daily  . aspirin EC  325 mg Oral Daily  . atorvastatin  20 mg Oral q1800  . enoxaparin (LOVENOX) injection  40 mg Subcutaneous Q24H  . insulin aspart  0-5 Units Subcutaneous QHS  . insulin aspart  0-9 Units Subcutaneous TID WC  . insulin glargine  10 Units Subcutaneous QHS  . levothyroxine  88 mcg Oral QAC breakfast  . lisinopril  20 mg Oral Daily   Continuous Infusions: . sodium chloride 75 mL/hr at 12/15/13 0915   PRN Meds:.acetaminophen, acetaminophen, albuterol, morphine injection, nitroGLYCERIN, ondansetron (ZOFRAN) IV, ondansetron  Assessment/Plan: 73 y.o. female with a h/o HTN, HL, and diabetes who presents with symptoms of chest pain She reports having [facility] "a month or so ago" to evaluate these symptoms but has not heard results yet.     Principal Problem:   Chest pain/ SOB - negative MI, recent mildly abnormal nuc study done 10/29/13, may be prudent to proceed with cath to determine her disease. Multiple risk factors, including FH -her daughter rec'd cardiac stent last week with MI--fluids at 75 cc hr. Active Problems:   Hypertensive urgency- decreased- improved    Essential hypertension   Hypothyroid    High cholesterol   Type 2 diabetes mellitus, uncontrolled, with renal complications- hgb U2G 8.2   Anemia in chronic kidney disease   Stage III chronic kidney disease- cr. 1.71-- 11 mos ago was 1.50    Hx of CVA   LOS: 2 days   Time spent with pt. :15 minutes. [facility] R  Nurse Practitioner Certified Pager [phone] or after 5pm and on weekends call [phone] 12/16/2013, 9:29 AM   Patient seen, examined. Available data reviewed. Agree with findings, assessment, and plan as outlined by Cecilie Kicks, NP. Pt with recurrent cheat pain with typical and atypical features. Enzymes and EKG negative. Abnormal Myoview last month as above with moderate fixed inferior perfusion defect. In my opinion, with an abnormal Myoview, recurrence of CP, 40+ years of diabetes, and strong family hx of CAD (she states 'everyone' in her family has had an MI or stroke), would favor cath and possible PCI. Reviewed risks, indications, alternatives with the patient. She understands and agrees to proceed. Note creatinine is 1.7 mg/dL. This is stable and she has been hydrated overnight. Advised her that today's study would likely be diagnostic only with limited dye to preserve renal fxn. She understands.  Sherren Mocha, M.D. 12/16/2013 10:58 AM

## 2013-12-16 NOTE — Progress Notes (Signed)
Subjective: No further pain- has brief episodes of chest pressure  Nuc study 10/29/13 adenosine myoview, with mildly abnormal with moderate fixed inferior attenuation artifact vs. Transmural scar.  EF 53% without regional wall motion abnormalities.   Objective: Vital signs in last 24 hours: Temp:  [97.9 F (36.6 C)-99.2 F (37.3 C)] 98.7 F (37.1 C) (08/24 0500) Pulse Rate:  [58-80] 58 (08/24 0500) Resp:  [17-18] 18 (08/24 0500) BP: (151-155)/(59-70) 152/70 mmHg (08/24 0500) SpO2:  [100 %] 100 % (08/24 0500) Weight change:  Last BM Date: 12/13/13 Intake/Output from previous day: +1742 08/23 0701 - 08/24 0700 In: 1742.5 [P.O.:120; I.V.:1622.5] Out: -  Intake/Output this shift:    PE: General:Pleasant affect, NAD Skin:Warm and dry, brisk capillary refill HEENT:normocephalic, sclera clear, mucus membranes moist Neck:supple, no JVD   Heart:S1S2 RRR without murmur, gallup, rub or click Lungs:clear without rales, rhonchi, or wheezes BOF:BPZW, non tender, + BS, do not palpate liver spleen or masses Ext:no lower ext edema, 2+ pedal pulses, 2+ radial pulses Neuro:alert and oriented, MAE, follows commands, + facial symmetry   Lab Results:  Recent Labs  12/14/13 1120 12/15/13 0435  WBC 5.8 5.7  HGB 11.0* 10.2*  HCT 34.3* 31.4*  PLT 276 270   BMET  Recent Labs  12/14/13 1120 12/15/13 0435  NA 143 142  K 4.3 4.2  CL 106 108  CO2 25 22  GLUCOSE 123* 137*  BUN 25* 25*  CREATININE 1.70* 1.71*  CALCIUM 9.9 9.3    Recent Labs  12/14/13 2229 12/15/13 0435  TROPONINI <0.30 <0.30    No results found for this basename: CHOL, HDL, LDLCALC, LDLDIRECT, TRIG, CHOLHDL   Lab Results  Component Value Date   HGBA1C 8.2* 12/14/2013     No results found for this basename: TSH     Studies/Results: 2D echo: Study Conclusions - Left ventricle: Basal septal hypertrophy with sigmoid shaped septum. The cavity size was normal. Systolic function was normal. The  estimated ejection fraction was in the range of 55% to 60%. Wall motion was normal; there were no regional wall motion abnormalities. - Mitral valve: There was mild regurgitation. - Atrial septum: No defect or patent foramen ovale was identified. Impressions: - Normal study.    Dg Chest 2 View  12/14/2013   CLINICAL DATA:  Chest pain  EXAM: CHEST  2 VIEW  COMPARISON:  11/03/2011  FINDINGS: The heart size and mediastinal contours are within normal limits. No acute infiltrate or pulmonary edema. Degenerative changes thoracic spine.  IMPRESSION: No active cardiopulmonary disease.   Electronically Signed   By: Lahoma Crocker M.D.   On: 12/14/2013 11:01    Medications: I have reviewed the patient's current medications. Scheduled Meds: . amLODipine  10 mg Oral Daily  . aspirin EC  325 mg Oral Daily  . atorvastatin  20 mg Oral q1800  . enoxaparin (LOVENOX) injection  40 mg Subcutaneous Q24H  . insulin aspart  0-5 Units Subcutaneous QHS  . insulin aspart  0-9 Units Subcutaneous TID WC  . insulin glargine  10 Units Subcutaneous QHS  . levothyroxine  88 mcg Oral QAC breakfast  . lisinopril  20 mg Oral Daily   Continuous Infusions: . sodium chloride 75 mL/hr at 12/15/13 0915   PRN Meds:.acetaminophen, acetaminophen, albuterol, morphine injection, nitroGLYCERIN, ondansetron (ZOFRAN) IV, ondansetron  Assessment/Plan: 73 y.o. female with a h/o HTN, HL, and diabetes who presents with symptoms of chest pain She reports having [facility] "a month or so ago" to evaluate these symptoms but has not heard results yet.     Principal Problem:   Chest pain/ SOB - negative MI, recent mildly abnormal nuc study done 10/29/13, may be prudent to proceed with cath to determine her disease. Multiple risk factors, including FH -her daughter rec'd cardiac stent last week with MI--fluids at 75 cc hr. Active Problems:   Hypertensive urgency- decreased- improved    Essential hypertension   Hypothyroid    High cholesterol   Type 2 diabetes mellitus, uncontrolled, with renal complications- hgb T6O 8.2   Anemia in chronic kidney disease   Stage III chronic kidney disease- cr. 1.71-- 11 mos ago was 1.50    Hx of CVA   LOS: 2 days   Time spent with pt. :15 minutes. [facility] R  Nurse Practitioner Certified Pager [phone] or after 5pm and on weekends call [phone] 12/16/2013, 9:29 AM   Patient seen, examined. Available data reviewed. Agree with findings, assessment, and plan as outlined by Cecilie Kicks, NP. Pt with recurrent cheat pain with typical and atypical features. Enzymes and EKG negative. Abnormal Myoview last month as above with moderate fixed inferior perfusion defect. In my opinion, with an abnormal Myoview, recurrence of CP, 40+ years of diabetes, and strong family hx of CAD (she states 'everyone' in her family has had an MI or stroke), would favor cath and possible PCI. Reviewed risks, indications, alternatives with the patient. She understands and agrees to proceed. Note creatinine is 1.7 mg/dL. This is stable and she has been hydrated overnight. Advised her that today's study would likely be diagnostic only with limited dye to preserve renal fxn. She understands.  Sherren Mocha, M.D. 12/16/2013 10:58 AM

## 2013-12-17 DIAGNOSIS — N039 Chronic nephritic syndrome with unspecified morphologic changes: Secondary | ICD-10-CM

## 2013-12-17 DIAGNOSIS — N189 Chronic kidney disease, unspecified: Secondary | ICD-10-CM

## 2013-12-17 DIAGNOSIS — D631 Anemia in chronic kidney disease: Secondary | ICD-10-CM

## 2013-12-17 LAB — BASIC METABOLIC PANEL
ANION GAP: 10 (ref 5–15)
Anion gap: 13 (ref 5–15)
BUN: 32 mg/dL — ABNORMAL HIGH (ref 6–23)
BUN: 31 mg/dL — ABNORMAL HIGH (ref 6–23)
CALCIUM: 9.2 mg/dL (ref 8.4–10.5)
CO2: 18 meq/L — AB (ref 19–32)
CO2: 24 mEq/L (ref 19–32)
CREATININE: 1.62 mg/dL — AB (ref 0.50–1.10)
Calcium: 8.8 mg/dL (ref 8.4–10.5)
Chloride: 108 mEq/L (ref 96–112)
Chloride: 108 mEq/L (ref 96–112)
Creatinine, Ser: 1.58 mg/dL — ABNORMAL HIGH (ref 0.50–1.10)
GFR calc Af Amer: 36 mL/min — ABNORMAL LOW (ref >90)
GFR calc Af Amer: 35 mL/min — ABNORMAL LOW (ref >90)
GFR calc non Af Amer: 31 mL/min — ABNORMAL LOW (ref >90)
GFR calc non Af Amer: 30 mL/min — ABNORMAL LOW (ref >90)
Glucose, Bld: 116 mg/dL — ABNORMAL HIGH (ref 70–99)
Glucose, Bld: 176 mg/dL — ABNORMAL HIGH (ref 70–99)
POTASSIUM: 6.5 meq/L — AB (ref 3.7–5.3)
Potassium: 5 mEq/L (ref 3.7–5.3)
SODIUM: 139 meq/L (ref 137–147)
Sodium: 142 mEq/L (ref 137–147)

## 2013-12-17 LAB — GLUCOSE, CAPILLARY
GLUCOSE-CAPILLARY: 95 mg/dL (ref 70–99)
Glucose-Capillary: 146 mg/dL — ABNORMAL HIGH (ref 70–99)

## 2013-12-17 MED ORDER — ATORVASTATIN CALCIUM 40 MG PO TABS
40.0000 mg | ORAL_TABLET | Freq: Every day | ORAL | Status: DC
  Administered 2013-12-17: 40 mg via ORAL
  Filled 2013-12-17: qty 1

## 2013-12-17 MED ORDER — INSULIN GLARGINE 100 UNIT/ML ~~LOC~~ SOLN: 20.0000 [IU] | Freq: Every day | SUBCUTANEOUS | Status: DC

## 2013-12-17 MED ORDER — INSULIN GLARGINE 100 UNIT/ML ~~LOC~~ SOLN
10.0000 [IU] | Freq: Two times a day (BID) | SUBCUTANEOUS | Status: DC
  Filled 2013-12-17 (×2): qty 0.1

## 2013-12-17 MED ORDER — POLYETHYLENE GLYCOL 3350 17 G PO PACK
17.0000 g | PACK | Freq: Every day | ORAL | Status: DC
  Administered 2013-12-17: 17 g via ORAL
  Filled 2013-12-17: qty 1

## 2013-12-17 NOTE — Progress Notes (Signed)
Patient Profile: 73 y.o. female with a h/o HTN, HL, and diabetes who presented with symptoms of chest pain.  Recent nuc study at [facility] 10/29/13 was an adenosine myoview with mildly abnormal with moderate fixed inferior attenuation artifact vs. Transmural scar. EF 53% without regional wall motion abnormalities. Referred for cath.   Subjective: Currently w/o complaints. Denies CP and dyspnea. Right radial access site is w/o complications.   Objective: Vital signs in last 24 hours: Temp:  [98.1 F (36.7 C)-98.8 F (37.1 C)] 98.1 F (36.7 C) (08/25 0500) Pulse Rate:  [56-67] 63 (08/25 0500) Resp:  [18] 18 (08/25 0500) BP: (131-176)/(56-87) 158/74 mmHg (08/25 0500) SpO2:  [98 %-100 %] 100 % (08/25 0500) Weight:  [224 lb 13.9 oz (102 kg)] 224 lb 13.9 oz (102 kg) (08/25 0500) Last BM Date: 12/13/13  Intake/Output from previous day: 08/24 0701 - 08/25 0700 In: 1633.8 [P.O.:600; I.V.:1033.8] Out: -  Intake/Output this shift:    Medications Current Facility-Administered Medications  Medication Dose Route Frequency Provider Last Rate Last Dose  . acetaminophen (TYLENOL) tablet 650 mg  650 mg Oral Q4H PRN Jettie Booze, MD      . albuterol (PROVENTIL) (2.5 MG/3ML) 0.083% nebulizer solution 2.5 mg  2.5 mg Nebulization Q2H PRN Modena Jansky, MD      . amLODipine (NORVASC) tablet 10 mg  10 mg Oral Daily Thompson Grayer, MD   10 mg at 12/16/13 0920  . aspirin chewable tablet 81 mg  81 mg Oral Daily Jettie Booze, MD      . atorvastatin (LIPITOR) tablet 40 mg  40 mg Oral q1800 Charlynne Cousins, MD      . enoxaparin (LOVENOX) injection 40 mg  40 mg Subcutaneous Q24H Jettie Booze, MD      . insulin aspart (novoLOG) injection 0-5 Units  0-5 Units Subcutaneous QHS Modena Jansky, MD   2 Units at 12/14/13 2110  . insulin aspart (novoLOG) injection 0-9 Units  0-9 Units Subcutaneous TID WC Modena Jansky, MD   3 Units at 12/16/13 1809  . insulin glargine (LANTUS)  injection 10 Units  10 Units Subcutaneous BID Charlynne Cousins, MD      . levothyroxine (SYNTHROID, LEVOTHROID) tablet 88 mcg  88 mcg Oral QAC breakfast Modena Jansky, MD   88 mcg at 12/16/13 0650  . lisinopril (PRINIVIL,ZESTRIL) tablet 20 mg  20 mg Oral Daily Modena Jansky, MD   20 mg at 12/16/13 0920  . morphine 2 MG/ML injection 1 mg  1 mg Intravenous Q4H PRN Modena Jansky, MD      . nitroGLYCERIN (NITROSTAT) SL tablet 0.4 mg  0.4 mg Sublingual Q5 min PRN Modena Jansky, MD      . ondansetron [facility]) tablet 4 mg  4 mg Oral Q6H PRN Modena Jansky, MD       Or  . ondansetron (ZOFRAN) injection 4 mg  4 mg Intravenous Q6H PRN Modena Jansky, MD      . polyethylene glycol (MIRALAX / GLYCOLAX) packet 17 g  17 g Oral Daily Charlynne Cousins, MD        PE: General appearance: alert, cooperative, no distress and moderately obese Lungs: clear to auscultation bilaterally Heart: regular rate and rhythm Extremities: no LEE Pulses: 2+ and symmetric Skin: warm and dry Neurologic: Grossly normal  Lab Results:   Recent Labs  12/14/13 1120 12/15/13 0435  WBC 5.8 5.7  HGB 11.0* 10.2*  HCT 34.3* 31.4*  PLT 276 270   BMET  Recent Labs  12/14/13 1120 12/15/13 0435 12/17/13 0604  NA 143 142 139  K 4.3 4.2 6.5*  CL 106 108 108  CO2 25 22 18*  GLUCOSE 123* 137* 116*  BUN 25* 25* 32*  CREATININE 1.70* 1.71* 1.58*  CALCIUM 9.9 9.3 8.8   PT/INR  Recent Labs  12/16/13 2132  LABPROT 13.1  INR 0.99   Studies/Results: LHC 12/16/13  LEFT VENTRICULOGRAM: Left ventricular angiogram was not done. LVEDP was 7 mmHg.  IMPRESSIONS:  1. Normal left main coronary artery. 2. Mild disease in the left anterior descending artery and its branches. 3. Mild disease in the left circumflex artery and its branches. 4. Mild disease in the right coronary artery as noted above. 5. Left ventricular systolic function was not assessed. LVEDP 7 mmHg.    Assessment/Plan  Principal  Problem:   Chest pain Active Problems:   Essential hypertension   Hypothyroid   High cholesterol   Type 2 diabetes mellitus, uncontrolled, with renal complications   Anemia in chronic kidney disease   Stage III chronic kidney disease   Hypertensive urgency   Intermediate coronary syndrome  1. Chest pain/ abnormal NST: LHC revealed mild nonobstructive CAD. Continued preventative therapy with agressive risk factor modification recommended. Needs better control of BP, DM and cholesterol.  2. HTN: Continue amlodipine and lisinopril. No BB due to borderline bradycardia.   3. HLD: continue statin therapy  4. DM: poorly controlled. Hgb A1c is 8.2. Continue management per [facility]  5. Hyperkalemia: K is 6.5 today. ? Hemolyzed sample. Repeat BMP pending. If still elevated, would recommend holding lisinopril.   6. Post Cath: right radial access site is stable and free of complications. Scr today is stable at 1.58. She is not on Metformin.     LOS: 3 days    [facility] 12/17/2013 8:19 AM  Patient seen, examined. Available data reviewed. Agree with findings, assessment, and plan as outlined by Lyda Jester, PA-C. Exam reveals well healed radial site, clear lung fields, and heart RRR without murmur. Cath result reviewed. Renal fxn stable. Repeat BMET looks fine. Does not require specific cardiology follow-up - can follow with PCP for treatment of HTN and diabetes. OK for discharge from CV perspective. Please call if questions, thx.  Sherren Mocha, M.D. 12/17/2013 12:02 PM

## 2013-12-17 NOTE — Progress Notes (Signed)
[facility] PROGRESS NOTE Assessment/Plan: Chest pain: - Risk factors-DM, HTN, HLD, obesity, CKD, former smoker, she was non complaint with antihypertensive. - Consulted cards. appriciated assistance. - Cardiac cath showed mild disease, will cont statin, ASA and betablockers. Will have to tighten regimen for DM A1c 8.2. - pending b-met  Urgency hypertension - due to non compliance of medications - now improved, after resuming home regimen  Uncontrolled type II DM with renal complications:  - Continue home dose of Lantus. Add NovoLog SSI. Check A1c.   Stage III chronic kidney disease:  - Creatinine is at baseline (1.5-1.7).  Anemia in chronic kidney disease:  - Stable.   Hypothyroid:  - Continue Synthroid    Code Status: Full  Family Communication: None at bedside  Disposition Plan: Home when medically stable    Consultants:  cardiology  Procedures:  Echo as above.  Antibiotics:  none  HPI/Subjective: Chest pain free.  Objective: Filed Vitals:   12/16/13 1630 12/16/13 1700 12/16/13 2100 12/17/13 0500  BP: 162/62 158/56 159/75 158/74  Pulse: 59 58 67 63  Temp:   98.7 F (37.1 C) 98.1 F (36.7 C)  TempSrc:   Oral Oral  Resp:   18 18  Height:      Weight:    102 kg (224 lb 13.9 oz)  SpO2: 99% 100% 98% 100%    Intake/Output Summary (Last 24 hours) at 12/17/13 0755 Last data filed at 12/16/13 1900  Gross per 24 hour  Intake 1633.75 ml  Output      0 ml  Net 1633.75 ml   Filed Weights   12/14/13 1441 12/15/13 0551 12/17/13 0500  Weight: 102.059 kg (225 lb) 102 kg (224 lb 13.9 oz) 102 kg (224 lb 13.9 oz)    Exam:  General: Alert, awake, oriented x3, in no acute distress.  HEENT: No bruits, no goiter.  Heart: Regular rate and rhythm. Lungs: Good air movement, clear Abdomen: Soft, nontender, nondistended, positive bowel sounds.    Data Reviewed: Basic Metabolic Panel:  Recent Labs Lab 12/14/13 1120 12/15/13 0435  NA 143 142  K  4.3 4.2  CL 106 108  CO2 25 22  GLUCOSE 123* 137*  BUN 25* 25*  CREATININE 1.70* 1.71*  CALCIUM 9.9 9.3   Liver Function Tests: No results found for this basename: AST, ALT, ALKPHOS, BILITOT, PROT, ALBUMIN,  in the last 168 hours No results found for this basename: LIPASE, AMYLASE,  in the last 168 hours No results found for this basename: AMMONIA,  in the last 168 hours CBC:  Recent Labs Lab 12/14/13 1120 12/15/13 0435  WBC 5.8 5.7  HGB 11.0* 10.2*  HCT 34.3* 31.4*  MCV 87.3 85.8  PLT 276 270   Cardiac Enzymes:  Recent Labs Lab 12/14/13 1120 12/14/13 1700 12/14/13 2229 12/15/13 0435  TROPONINI <0.30 <0.30 <0.30 <0.30   BNP (last 3 results) No results found for this basename: PROBNP,  in the last 8760 hours CBG:  Recent Labs Lab 12/16/13 0743 12/16/13 1149 12/16/13 1655 12/16/13 2114 12/17/13 0729  GLUCAP 126* 106* 238* 125* 95    No results found for this or any previous visit (from the past 240 hour(s)).   Studies: No results found.  Scheduled Meds: . amLODipine  10 mg Oral Daily  . aspirin  81 mg Oral Daily  . atorvastatin  20 mg Oral q1800  . enoxaparin (LOVENOX) injection  40 mg Subcutaneous Q24H  . insulin aspart  0-5 Units Subcutaneous QHS  .  insulin aspart  0-9 Units Subcutaneous TID WC  . insulin glargine  10 Units Subcutaneous QHS  . levothyroxine  88 mcg Oral QAC breakfast  . lisinopril  20 mg Oral Daily   Continuous Infusions:     [facility]  [facility] Pager [phone]. If 8PM-8AM, please contact night-coverage at [email or web address], password [email or web address] 12/17/2013, 7:55 AM  LOS: 3 days      **Disclaimer: This note may have been dictated with voice recognition software. Similar sounding words can inadvertently be transcribed and this note may contain transcription errors which may not have been corrected upon publication of note.**

## 2013-12-17 NOTE — Progress Notes (Signed)
Reviewed discharge instructions with patient and she stated her understanding. 2 week follow up appointment made with patient's primary care physician.   Discharged home with family via wheelchair.  Sanda Linger

## 2013-12-17 NOTE — Discharge Summary (Signed)
Physician Discharge Summary  Teresa Flowers [id number] DOB: April 16, 1941 DOA: 12/14/2013  PCP: Glendon Axe, MD  Admit date: 12/14/2013 Discharge date: 12/17/2013  Time spent: 35 minutes  Recommendations for Outpatient Follow-up:  1. Follow up with PCP  Discharge Diagnoses:  Principal Problem:   Chest pain Active Problems:   Essential hypertension   Hypothyroid   High cholesterol   Type 2 diabetes mellitus, uncontrolled, with renal complications   Anemia in chronic kidney disease   Stage III chronic kidney disease   Hypertensive urgency   Intermediate coronary syndrome   Discharge Condition: stable  Diet recommendation: ADA diet  Filed Weights   12/14/13 1441 12/15/13 0551 12/17/13 0500  Weight: 102.059 kg (225 lb) 102 kg (224 lb 13.9 oz) 102 kg (224 lb 13.9 oz)    History of present illness:  73 y.o. female, retired Brewing technologist, with history of type II DM/IDDM with renal complications, hypertension, dyslipidemia, TIA, stage III chronic kidney disease, anemia, vertigo, strong family history of heart disease and diabetes, former smoker, transferred from [place] to [facility] for complaints of chest pain. Patient states that she has been having left upper chest pains radiating intermittently to the left side of neck for almost a year. However over the last 1 week, she gives h/o daily chest pains, mostly at rest and this morning when she woke up she also noticed "funny sensation in her left upper extremity". Chest pains are described as pressure-like/soreness, moderate intensity without significant relieving or aggravating factors. She gives history of dyspnea on exertion. Denies diaphoresis, nausea or vomiting. Her 64 year old daughter recently had a heart attack and underwent stent.   Hospital Course:  Chest pain:  - Risk factors-DM, HTN, HLD, obesity, CKD, former smoker, she was non complaint with antihypertensive.  - Consulted cards.  appriciated assistance.  - Cardiac cath showed mild disease, will cont statin, ASA and betablockers. Will have to tighten regimen for DM A1c 8.2.  Urgency hypertension  - due to non compliance of medications  - now improved, after resuming home regimen   Uncontrolled type II DM with renal complications:  - Continue home dose of Lantus. Add NovoLog SSI. Check A1c.   Stage III chronic kidney disease:  - Creatinine is at baseline (1.5-1.7).   Anemia in chronic kidney disease:  - Stable.   Hypothyroid:  - Continue Synthroid   Procedures:  Cardiac cath  cxr  Consultations:  cardiology  Discharge Exam: Filed Vitals:   12/17/13 0500  BP: 158/74  Pulse: 63  Temp: 98.1 F (36.7 C)  Resp: 18    General: A&O x3 Cardiovascular: RRR Respiratory: good air movement CTA B/L  Discharge Instructions You were cared for by a hospitalist during your hospital stay. If you have any questions about your discharge medications or the care you received while you were in the hospital after you are discharged, you can call the unit and asked to speak with the hospitalist on call if the hospitalist that took care of you is not available. Once you are discharged, your primary care physician will handle any further medical issues. Please note that NO REFILLS for any discharge medications will be authorized once you are discharged, as it is imperative that you return to your primary care physician (or establish a relationship with a primary care physician if you do not have one) for your aftercare needs so that they can reassess your need for medications and monitor your lab values.  Discharge Instructions   Diet - low sodium heart healthy    Complete by:  As directed      Increase activity slowly    Complete by:  As directed             Medication List         amLODipine 5 MG tablet  Commonly known as:  NORVASC  Take 5 mg by mouth daily.     insulin glargine 100 UNIT/ML injection   Commonly known as:  LANTUS  Inject 0.2 mLs (20 Units total) into the skin at bedtime.     insulin lispro 100 UNIT/ML injection  Commonly known as:  HUMALOG  Inject 2-6 Units into the skin 3 (three) times daily before meals.     levothyroxine 88 MCG tablet  Commonly known as:  SYNTHROID, LEVOTHROID  Take 88 mcg by mouth daily before breakfast.     lisinopril 20 MG tablet  Commonly known as:  PRINIVIL,ZESTRIL  Take 20 mg by mouth daily.     meclizine 25 MG tablet  Commonly known as:  ANTIVERT  Take 25 mg by mouth 3 (three) times daily as needed for dizziness.     rosuvastatin 10 MG tablet  Commonly known as:  CRESTOR  Take 10 mg by mouth daily.     VITAMIN D PO  Take by mouth.       No Known Allergies    The results of significant diagnostics from this hospitalization (including imaging, microbiology, ancillary and laboratory) are listed below for reference.    Significant Diagnostic Studies: Dg Chest 2 View  12/14/2013   CLINICAL DATA:  Chest pain  EXAM: CHEST  2 VIEW  COMPARISON:  11/03/2011  FINDINGS: The heart size and mediastinal contours are within normal limits. No acute infiltrate or pulmonary edema. Degenerative changes thoracic spine.  IMPRESSION: No active cardiopulmonary disease.   Electronically Signed   By: Lahoma Crocker M.D.   On: 12/14/2013 11:01    Microbiology: No results found for this or any previous visit (from the past 240 hour(s)).   Labs: Basic Metabolic Panel:  Recent Labs Lab 12/14/13 1120 12/15/13 0435 12/17/13 0604 12/17/13 0900  NA 143 142 139 142  K 4.3 4.2 6.5* 5.0  CL 106 108 108 108  CO2 25 22 18* 24  GLUCOSE 123* 137* 116* 176*  BUN 25* 25* 32* 31*  CREATININE 1.70* 1.71* 1.58* 1.62*  CALCIUM 9.9 9.3 8.8 9.2   Liver Function Tests: No results found for this basename: AST, ALT, ALKPHOS, BILITOT, PROT, ALBUMIN,  in the last 168 hours No results found for this basename: LIPASE, AMYLASE,  in the last 168 hours No results found  for this basename: AMMONIA,  in the last 168 hours CBC:  Recent Labs Lab 12/14/13 1120 12/15/13 0435  WBC 5.8 5.7  HGB 11.0* 10.2*  HCT 34.3* 31.4*  MCV 87.3 85.8  PLT 276 270   Cardiac Enzymes:  Recent Labs Lab 12/14/13 1120 12/14/13 1700 12/14/13 2229 12/15/13 0435  TROPONINI <0.30 <0.30 <0.30 <0.30   BNP: BNP (last 3 results) No results found for this basename: PROBNP,  in the last 8760 hours CBG:  Recent Labs Lab 12/16/13 1149 12/16/13 1655 12/16/13 2114 12/17/13 0729 12/17/13 1113  GLUCAP 106* 238* 125* 95 146*       Signed:  FELIZ ORTIZ, Raychelle Hudman  [facility] 12/17/2013, 12:44 PM

## 2013-12-17 NOTE — Discharge Instructions (Signed)
Chest Pain (Nonspecific) It is often hard to give a diagnosis for the cause of chest pain. There is always a chance that your pain could be related to something serious, such as a heart attack or a blood clot in the lungs. You need to follow up with your doctor. HOME CARE  If antibiotic medicine was given, take it as directed by your doctor. Finish the medicine even if you start to feel better.  For the next few days, avoid activities that bring on chest pain. Continue physical activities as told by your doctor.  Do not use any tobacco products. This includes cigarettes, chewing tobacco, and e-cigarettes.  Avoid drinking alcohol.  Only take medicine as told by your doctor.  Follow your doctor's suggestions for more testing if your chest pain does not go away.  Keep all doctor visits you made. GET HELP IF:  Your chest pain does not go away, even after treatment.  You have a rash with blisters on your chest.  You have a fever. GET HELP RIGHT AWAY IF:   You have more pain or pain that spreads to your arm, neck, jaw, back, or belly (abdomen).  You have shortness of breath.  You cough more than usual or cough up blood.  You have very bad back or belly pain.  You feel sick to your stomach (nauseous) or throw up (vomit).  You have very bad weakness.  You pass out (faint).  You have chills. This is an emergency. Do not wait to see if the problems will go away. Call your local emergency services (911 in [place] Do not drive yourself to the hospital. MAKE SURE YOU:   Understand these instructions.  Will watch your condition.  Will get help right away if you are not doing well or get worse. Document Released: 09/28/2007 Document Revised: 04/16/2013 Document Reviewed: 09/28/2007 [facility] Patient Information 2015 [place], [place]. This information is not intended to replace advice given to you by your health care provider. Make sure you discuss any questions you have with your  health care provider.  Coronary Artery Disease Coronary artery disease (CAD) is a process in which the heart (coronary) arteries narrow or become blocked from the development of atherosclerosis. Atherosclerosis is a disease in which plaque builds up on the inside of the heart arteries (coronary arteries). Plaque is made up of fats (lipids), cholesterol, calcium, and fibrous tissue. CAD can lead to a heart attack (myocardial infarction, MI). An MI can lead to heart failure, cardiogenic shock, or sudden cardiac death. CAD can cause an MI through:  Plaque buildup that can severely narrow or block the coronary arteries and diminish blood flow.  Plaque that can become unstable and "rupture." Unstable plaque that ruptures within a coronary artery can form a clot and cause a sudden (acute) blockage. RISK FACTORS Many risk factors contribute to the development of CAD. These include:  High cholesterol (dyslipidemia) levels.  High blood pressure (hypertension).  Smoking.  Diabetes.  Age.  Gender. Men can develop CAD earlier in life than women.  Family history.  Inactivity or lack of regular physical or aerobic exercise.  A diet high in saturated fats.  Chronic kidney disease. SYMPTOMS  When a coronary artery is narrowed or blocked, an MI can occur. MI symptoms can include:  Chest pain (agina). Angina can occur by itself or it can also occur with pain in the neck, arm, jaw, or in the upper, middle back (mid-scapular pain).  Profuse sweating (diaphoresis) without physical activity  or movement.  Shortness of breath (dyspnea).  Irregular heartbeats (palpitations) that feel like your heart is skipping beats or is beating very fast.  Nausea.  Epigastric pain. Epigastric pain may occur as "heartburn."  Tiredness (malaise). This can especially be present in the elderly. Women can have different (atypical) symptoms other than classic angina.  DIAGNOSIS  The diagnosis of CAD may  include:  An electrocardiography (ECG). An ECG does not diagnose CAD, but it is usefull in the detection of a sudden (acute) MI or as a marker for a previous MI. Depending on which heart (coronary) artery may be blocked, an ECG may not pick up an MI pattern.  Exercise stress test. A stress test can be performed at rest for people who are unable to do an exercise stress test. A stress test will only be abnormal if one or more of the large coronary arteries is significantly blocked.  Blood tests. Tests may include samples to detect heart muscle damage (such as troponin levels). Other tests may include cholesterol checks and an inflammation test (high-sensitivity C-reactive protein, hs-CRP).  Coronary angiography.  Screening people who have peripheral vascular disease (PAD). These people often times have CAD. TREATMENT  The treatment of CAD includes the following:  Lifestyle changes such as:  Following a heart-healthy diet. A registered dietitian can you help educate you on healthy food options and changes.  Quiting smoking.  Following an exercise program approved by your caregiver.  Maintaining a healthy weight. Lose weight as approved by your caregiver.  Medicines to help control your blood pressure, cholesterol level, angina, and blood clotting. Medicines may include beta-blockers, ACE inhibitors, statins, nitrates, and anti-platelet medicines.  If you have a heart stent and are taking anti-platelet medicine, it is important to not suddenly stop taking this medicine. Suddenly stopping anti-platelet medicine can result in an MI. Talk with your caregiver before stopping medicine or if you cannot afford your medicine.  If the coronary arteries are significantly blocked, surgery may be needed. This can include:  Percutaneous coronary intervention (PCI) with or without stent placement.  Coronary artery bypass graft surgery (CABG). SEEK IMMEDIATE MEDICAL CARE IF:   You develop MI  symptoms. This is a medical emergency. Get help at once. Call your local emergency service (911 in the [place] immediately. Do not drive yourself to the clinic or hospital. MI symptoms can include:  Angina or pain that occurs in the neck, arm, jaw, or in the upper middle back.  Profuse sweating without cause.  Shortness of breath or difficulty breathing without cause.  Unexplained nausea or epigastric pain that feels like heartburn. Document Released: 07/04/2011 Document Reviewed: 08/13/2013 [facility] Patient Information 2015 [place]. This information is not intended to replace advice given to you by your health care provider. Make sure you discuss any questions you have with your health care provider.

## 2014-04-03 ENCOUNTER — Encounter (HOSPITAL_COMMUNITY): Payer: Self-pay | Admitting: Interventional Cardiology

## 2014-07-05 ENCOUNTER — Inpatient Hospital Stay (HOSPITAL_COMMUNITY)
Admission: EM | Admit: 2014-07-05 | Discharge: 2014-07-17 | DRG: 330 | Disposition: A | Discharge status: Home Health | Payer: Medicare Other | Attending: Internal Medicine | Admitting: Internal Medicine

## 2014-07-05 ENCOUNTER — Encounter (HOSPITAL_COMMUNITY): Payer: Self-pay | Admitting: Family Medicine

## 2014-07-05 DIAGNOSIS — I129 Hypertensive chronic kidney disease with stage 1 through stage 4 chronic kidney disease, or unspecified chronic kidney disease: Secondary | ICD-10-CM | POA: Diagnosis present

## 2014-07-05 DIAGNOSIS — C180 Malignant neoplasm of cecum: Principal | ICD-10-CM | POA: Diagnosis present

## 2014-07-05 DIAGNOSIS — E7800 Pure hypercholesterolemia, unspecified: Secondary | ICD-10-CM | POA: Diagnosis present

## 2014-07-05 DIAGNOSIS — L680 Hirsutism: Secondary | ICD-10-CM | POA: Diagnosis not present

## 2014-07-05 DIAGNOSIS — I10 Essential (primary) hypertension: Secondary | ICD-10-CM | POA: Diagnosis present

## 2014-07-05 DIAGNOSIS — N179 Acute kidney failure, unspecified: Secondary | ICD-10-CM

## 2014-07-05 DIAGNOSIS — K5730 Diverticulosis of large intestine without perforation or abscess without bleeding: Secondary | ICD-10-CM | POA: Diagnosis present

## 2014-07-05 DIAGNOSIS — C189 Malignant neoplasm of colon, unspecified: Secondary | ICD-10-CM | POA: Insufficient documentation

## 2014-07-05 DIAGNOSIS — E860 Dehydration: Secondary | ICD-10-CM | POA: Diagnosis present

## 2014-07-05 DIAGNOSIS — D631 Anemia in chronic kidney disease: Secondary | ICD-10-CM | POA: Diagnosis present

## 2014-07-05 DIAGNOSIS — K567 Ileus, unspecified: Secondary | ICD-10-CM | POA: Diagnosis not present

## 2014-07-05 DIAGNOSIS — E669 Obesity, unspecified: Secondary | ICD-10-CM | POA: Diagnosis present

## 2014-07-05 DIAGNOSIS — E1122 Type 2 diabetes mellitus with diabetic chronic kidney disease: Secondary | ICD-10-CM | POA: Diagnosis present

## 2014-07-05 DIAGNOSIS — Z87891 Personal history of nicotine dependence: Secondary | ICD-10-CM

## 2014-07-05 DIAGNOSIS — K922 Gastrointestinal hemorrhage, unspecified: Secondary | ICD-10-CM | POA: Diagnosis present

## 2014-07-05 DIAGNOSIS — E1129 Type 2 diabetes mellitus with other diabetic kidney complication: Secondary | ICD-10-CM | POA: Diagnosis not present

## 2014-07-05 DIAGNOSIS — E1165 Type 2 diabetes mellitus with hyperglycemia: Secondary | ICD-10-CM | POA: Diagnosis not present

## 2014-07-05 DIAGNOSIS — R000 Tachycardia, unspecified: Secondary | ICD-10-CM | POA: Diagnosis present

## 2014-07-05 DIAGNOSIS — E039 Hypothyroidism, unspecified: Secondary | ICD-10-CM | POA: Diagnosis not present

## 2014-07-05 DIAGNOSIS — D125 Benign neoplasm of sigmoid colon: Secondary | ICD-10-CM

## 2014-07-05 DIAGNOSIS — I679 Cerebrovascular disease, unspecified: Secondary | ICD-10-CM | POA: Diagnosis not present

## 2014-07-05 DIAGNOSIS — K219 Gastro-esophageal reflux disease without esophagitis: Secondary | ICD-10-CM | POA: Diagnosis not present

## 2014-07-05 DIAGNOSIS — IMO0002 Reserved for concepts with insufficient information to code with codable children: Secondary | ICD-10-CM | POA: Diagnosis present

## 2014-07-05 DIAGNOSIS — E038 Other specified hypothyroidism: Secondary | ICD-10-CM | POA: Diagnosis not present

## 2014-07-05 DIAGNOSIS — D122 Benign neoplasm of ascending colon: Secondary | ICD-10-CM | POA: Diagnosis not present

## 2014-07-05 DIAGNOSIS — K644 Residual hemorrhoidal skin tags: Secondary | ICD-10-CM | POA: Diagnosis not present

## 2014-07-05 DIAGNOSIS — Z794 Long term (current) use of insulin: Secondary | ICD-10-CM | POA: Diagnosis not present

## 2014-07-05 DIAGNOSIS — D62 Acute posthemorrhagic anemia: Secondary | ICD-10-CM | POA: Diagnosis present

## 2014-07-05 DIAGNOSIS — E11649 Type 2 diabetes mellitus with hypoglycemia without coma: Secondary | ICD-10-CM | POA: Diagnosis present

## 2014-07-05 DIAGNOSIS — N189 Chronic kidney disease, unspecified: Secondary | ICD-10-CM | POA: Diagnosis present

## 2014-07-05 DIAGNOSIS — N183 Chronic kidney disease, stage 3 unspecified: Secondary | ICD-10-CM | POA: Diagnosis present

## 2014-07-05 DIAGNOSIS — Z8673 Personal history of transient ischemic attack (TIA), and cerebral infarction without residual deficits: Secondary | ICD-10-CM

## 2014-07-05 DIAGNOSIS — R918 Other nonspecific abnormal finding of lung field: Secondary | ICD-10-CM | POA: Diagnosis present

## 2014-07-05 DIAGNOSIS — I2510 Atherosclerotic heart disease of native coronary artery without angina pectoris: Secondary | ICD-10-CM | POA: Diagnosis not present

## 2014-07-05 DIAGNOSIS — K921 Melena: Secondary | ICD-10-CM | POA: Insufficient documentation

## 2014-07-05 DIAGNOSIS — Z6837 Body mass index (BMI) 37.0-37.9, adult: Secondary | ICD-10-CM | POA: Diagnosis not present

## 2014-07-05 DIAGNOSIS — I200 Unstable angina: Secondary | ICD-10-CM | POA: Diagnosis present

## 2014-07-05 DIAGNOSIS — K6389 Other specified diseases of intestine: Secondary | ICD-10-CM

## 2014-07-05 DIAGNOSIS — E785 Hyperlipidemia, unspecified: Secondary | ICD-10-CM | POA: Diagnosis not present

## 2014-07-05 LAB — URINALYSIS, ROUTINE W REFLEX MICROSCOPIC
Bilirubin Urine: NEGATIVE
Glucose, UA: NEGATIVE mg/dL
Hgb urine dipstick: NEGATIVE
Ketones, ur: NEGATIVE mg/dL
NITRITE: NEGATIVE
PROTEIN: NEGATIVE mg/dL
Specific Gravity, Urine: 1.012 (ref 1.005–1.030)
Urobilinogen, UA: 0.2 mg/dL (ref 0.0–1.0)
pH: 5 (ref 5.0–8.0)

## 2014-07-05 LAB — COMPREHENSIVE METABOLIC PANEL
ALT: 12 U/L (ref 0–35)
AST: 18 U/L (ref 0–37)
Albumin: 3.3 g/dL — ABNORMAL LOW (ref 3.5–5.2)
Alkaline Phosphatase: 77 U/L (ref 39–117)
Anion gap: 10 (ref 5–15)
BUN: 63 mg/dL — ABNORMAL HIGH (ref 6–23)
CO2: 21 mmol/L (ref 19–32)
Calcium: 9 mg/dL (ref 8.4–10.5)
Chloride: 108 mmol/L (ref 96–112)
Creatinine, Ser: 2.39 mg/dL — ABNORMAL HIGH (ref 0.50–1.10)
GFR calc Af Amer: 22 mL/min — ABNORMAL LOW (ref >90)
GFR calc non Af Amer: 19 mL/min — ABNORMAL LOW (ref >90)
Glucose, Bld: 283 mg/dL — ABNORMAL HIGH (ref 70–99)
POTASSIUM: 4.3 mmol/L (ref 3.5–5.1)
Sodium: 139 mmol/L (ref 135–145)
Total Bilirubin: 0.3 mg/dL (ref 0.3–1.2)
Total Protein: 6.7 g/dL (ref 6.0–8.3)

## 2014-07-05 LAB — CBC
HCT: 24.8 % — ABNORMAL LOW (ref 36.0–46.0)
HEMATOCRIT: 24.1 % — AB (ref 36.0–46.0)
HEMOGLOBIN: 8 g/dL — AB (ref 12.0–15.0)
Hemoglobin: 8.1 g/dL — ABNORMAL LOW (ref 12.0–15.0)
MCH: 28.1 pg (ref 26.0–34.0)
MCH: 28.3 pg (ref 26.0–34.0)
MCHC: 32.3 g/dL (ref 30.0–36.0)
MCHC: 33.6 g/dL (ref 30.0–36.0)
MCV: 87 fL (ref 78.0–100.0)
MCV: 84.3 fL (ref 78.0–100.0)
PLATELETS: 274 10*3/uL (ref 150–400)
Platelets: 257 10*3/uL (ref 150–400)
RBC: 2.85 MIL/uL — ABNORMAL LOW (ref 3.87–5.11)
RBC: 2.86 MIL/uL — ABNORMAL LOW (ref 3.87–5.11)
RDW: 13.8 % (ref 11.5–15.5)
RDW: 13.6 % (ref 11.5–15.5)
WBC: 8.5 10*3/uL (ref 4.0–10.5)
WBC: 11.1 10*3/uL — ABNORMAL HIGH (ref 4.0–10.5)

## 2014-07-05 LAB — NO BLOOD PRODUCTS

## 2014-07-05 LAB — URINE MICROSCOPIC-ADD ON

## 2014-07-05 LAB — GLUCOSE, CAPILLARY
GLUCOSE-CAPILLARY: 203 mg/dL — AB (ref 70–99)
Glucose-Capillary: 260 mg/dL — ABNORMAL HIGH (ref 70–99)

## 2014-07-05 LAB — POC OCCULT BLOOD, ED: FECAL OCCULT BLD: POSITIVE — AB

## 2014-07-05 MED ORDER — INSULIN ASPART 100 UNIT/ML ~~LOC~~ SOLN
0.0000 [IU] | SUBCUTANEOUS | Status: DC
  Administered 2014-07-05: 3 [IU] via SUBCUTANEOUS
  Administered 2014-07-05: 5 [IU] via SUBCUTANEOUS
  Administered 2014-07-06: 1 [IU] via SUBCUTANEOUS
  Administered 2014-07-06: 9 [IU] via SUBCUTANEOUS
  Administered 2014-07-06 – 2014-07-07 (×3): 1 [IU] via SUBCUTANEOUS
  Administered 2014-07-07 (×2): 2 [IU] via SUBCUTANEOUS
  Administered 2014-07-07: 1 [IU] via SUBCUTANEOUS
  Administered 2014-07-08: 2 [IU] via SUBCUTANEOUS
  Administered 2014-07-08 (×2): 3 [IU] via SUBCUTANEOUS
  Administered 2014-07-08: 1 [IU] via SUBCUTANEOUS
  Administered 2014-07-08: 2 [IU] via SUBCUTANEOUS

## 2014-07-05 MED ORDER — AMLODIPINE BESYLATE 5 MG PO TABS
5.0000 mg | ORAL_TABLET | Freq: Every day | ORAL | Status: DC
  Administered 2014-07-06 – 2014-07-08 (×3): 5 mg via ORAL
  Filled 2014-07-05 (×4): qty 1

## 2014-07-05 MED ORDER — SODIUM CHLORIDE 0.9 % IJ SOLN
3.0000 mL | Freq: Two times a day (BID) | INTRAMUSCULAR | Status: DC
Start: 2014-07-05 — End: 2014-07-17
  Administered 2014-07-06 – 2014-07-17 (×16): 3 mL via INTRAVENOUS

## 2014-07-05 MED ORDER — AMLODIPINE BESYLATE 5 MG PO TABS: 5.0000 mg | ORAL_TABLET | Freq: Every day | ORAL | Status: DC

## 2014-07-05 MED ORDER — ONDANSETRON HCL 4 MG/2ML IJ SOLN
4.0000 mg | Freq: Four times a day (QID) | INTRAMUSCULAR | Status: DC | PRN
  Administered 2014-07-09 – 2014-07-11 (×4): 4 mg via INTRAVENOUS
  Filled 2014-07-05 (×5): qty 2

## 2014-07-05 MED ORDER — LEVOTHYROXINE SODIUM 88 MCG PO TABS
88.0000 ug | ORAL_TABLET | Freq: Every day | ORAL | Status: DC
  Administered 2014-07-06 – 2014-07-08 (×3): 88 ug via ORAL
  Filled 2014-07-05 (×5): qty 1

## 2014-07-05 MED ORDER — ONDANSETRON HCL 4 MG/2ML IJ SOLN: 4.0000 mg | Freq: Three times a day (TID) | INTRAMUSCULAR | Status: DC | PRN

## 2014-07-05 MED ORDER — SODIUM CHLORIDE 0.9 % IV SOLN
Freq: Once | INTRAVENOUS | Status: AC
  Administered 2014-07-05: 16:00:00 via INTRAVENOUS

## 2014-07-05 MED ORDER — ACETAMINOPHEN 650 MG RE SUPP: 650.0000 mg | Freq: Four times a day (QID) | RECTAL | Status: DC | PRN

## 2014-07-05 MED ORDER — ACETAMINOPHEN 325 MG PO TABS: 650.0000 mg | ORAL_TABLET | Freq: Four times a day (QID) | ORAL | Status: DC | PRN

## 2014-07-05 MED ORDER — PNEUMOCOCCAL VAC POLYVALENT 25 MCG/0.5ML IJ INJ
0.5000 mL | INJECTION | INTRAMUSCULAR | Status: AC
  Administered 2014-07-06: 0.5 mL via INTRAMUSCULAR
  Filled 2014-07-05: qty 0.5

## 2014-07-05 MED ORDER — SODIUM CHLORIDE 0.9 % IV SOLN: INTRAVENOUS | Status: DC

## 2014-07-05 MED ORDER — INSULIN GLARGINE 100 UNIT/ML ~~LOC~~ SOLN
10.0000 [IU] | Freq: Every day | SUBCUTANEOUS | Status: DC
  Administered 2014-07-05 – 2014-07-08 (×4): 10 [IU] via SUBCUTANEOUS
  Filled 2014-07-05 (×5): qty 0.1

## 2014-07-05 MED ORDER — ONDANSETRON HCL 4 MG PO TABS: 4.0000 mg | ORAL_TABLET | Freq: Four times a day (QID) | ORAL | Status: DC | PRN

## 2014-07-05 MED ORDER — POLYETHYLENE GLYCOL 3350 17 G PO PACK
17.0000 g | PACK | Freq: Every day | ORAL | Status: DC | PRN
  Filled 2014-07-05: qty 1

## 2014-07-05 MED ORDER — SODIUM CHLORIDE 0.9 % IV SOLN
INTRAVENOUS | Status: DC
  Administered 2014-07-05: 17:00:00 via INTRAVENOUS

## 2014-07-05 MED ORDER — PANTOPRAZOLE SODIUM 40 MG IV SOLR
40.0000 mg | Freq: Two times a day (BID) | INTRAVENOUS | Status: DC
  Administered 2014-07-05 – 2014-07-06 (×3): 40 mg via INTRAVENOUS
  Filled 2014-07-05 (×5): qty 40

## 2014-07-05 NOTE — ED Notes (Signed)
Chaperoned rectal exam for Teresa Flowers, [place]. Tolerated well. Specimen taken to lab.

## 2014-07-05 NOTE — ED Notes (Signed)
Admitting team at bedside.

## 2014-07-05 NOTE — Progress Notes (Signed)
Report received from ED nurse Sharyn Lull for patient to be admitted into 5w03

## 2014-07-05 NOTE — ED Notes (Signed)
Per pt sts 6 episodes of rectal bleeding that started yesterday. sts foul smell. sts darl blood. Denies any pain sts she feels nausea and weak,.

## 2014-07-05 NOTE — ED Provider Notes (Signed)
CSN: [id number]     Arrival date & time 07/05/14  1126 History   First MD Initiated Contact with Patient 07/05/14 1212     Chief Complaint  Patient presents with  . Rectal Bleeding     (Consider location/radiation/quality/duration/timing/severity/associated sxs/prior Treatment) HPI Comments: Patient who is a Jehovah witness, aspirin use -- presents with c/o bloody diarrhea without abdominal pain starting yesterday. She reports foul smelling stool and generalized weakness. No N/V. She noted some red blood on the toilet tissue. No fever, CP, SOB, lightheadedness, syncope. She has not had symptoms like this before. Patient had a colonoscopy recently and states that she had 2 polyps, no other problems. No history of diverticulitis. The onset of this condition was acute. The course is constant. Aggravating factors: none. Alleviating factors: none.    Patient is a 74 y.o. female presenting with hematochezia. The history is provided by the patient and medical records.  Rectal Bleeding Associated symptoms: no abdominal pain, no fever, no light-headedness and no vomiting     Past Medical History  Diagnosis Date  . Essential hypertension   . Vertigo   . Hypothyroid   . High cholesterol   . Type 2 diabetes mellitus, uncontrolled, with renal complications   . Anemia in chronic kidney disease   . Stage III chronic kidney disease   . Cerebrovascular disease     prior infarcts seen on head CT per patient  . Obesity   . Hypertensive urgency 12/16/2013  . Refusal of blood product   . TIA (transient ischemic attack)   . GERD (gastroesophageal reflux disease)   . Intermediate coronary syndrome    Past Surgical History  Procedure Laterality Date  . Cardiovascular stress test  10/29/13    mildly abnoraml adenosine myoview EF 53%  . Colonoscopy    . Cyst excision    . Left heart catheterization with coronary angiogram N/A 12/16/2013    Procedure: LEFT HEART CATHETERIZATION WITH CORONARY ANGIOGRAM;   Surgeon: Jettie Booze, MD;  Location: [facility] CATH LAB;  Service: Cardiovascular;  Laterality: N/A;   Family History  Problem Relation Age of Onset  . Heart disease Mother   . Transient ischemic attack Brother   . Heart attack Daughter    History  Substance Use Topics  . Smoking status: Former Smoker    Quit date: 04/25/1993  . Smokeless tobacco: Never Used  . Alcohol Use: No   OB History    No data available     Review of Systems  Constitutional: Negative for fever.  HENT: Negative for rhinorrhea and sore throat.   Eyes: Negative for redness.  Respiratory: Negative for cough and shortness of breath.   Cardiovascular: Negative for chest pain.  Gastrointestinal: Positive for diarrhea, blood in stool and hematochezia. Negative for nausea, vomiting and abdominal pain.  Genitourinary: Negative for dysuria.  Musculoskeletal: Negative for myalgias.  Skin: Negative for rash.  Neurological: Negative for syncope, weakness, light-headedness and headaches.    Allergies  Review of patient's allergies indicates no known allergies.  Home Medications   Prior to Admission medications   Medication Sig Start Date End Date Taking? Authorizing Provider  amLODipine (NORVASC) 5 MG tablet Take 5 mg by mouth daily.    Historical Provider, MD  Cholecalciferol (VITAMIN D PO) Take by mouth.    Historical Provider, MD  insulin glargine (LANTUS) 100 UNIT/ML injection Inject 0.2 mLs (20 Units total) into the skin at bedtime. 12/17/13   Charlynne Cousins, MD  insulin lispro (HUMALOG)  100 UNIT/ML injection Inject 2-6 Units into the skin 3 (three) times daily before meals.     Historical Provider, MD  levothyroxine (SYNTHROID, LEVOTHROID) 88 MCG tablet Take 88 mcg by mouth daily before breakfast.    Historical Provider, MD  lisinopril (PRINIVIL,ZESTRIL) 20 MG tablet Take 20 mg by mouth daily.    Historical Provider, MD  meclizine (ANTIVERT) 25 MG tablet Take 25 mg by mouth 3 (three) times daily as  needed for dizziness.    Historical Provider, MD  rosuvastatin (CRESTOR) 10 MG tablet Take 10 mg by mouth daily.    Historical Provider, MD   BP 105/92 mmHg  Pulse 85  Temp(Src) 98.6 F (37 C) (Oral)  Resp 17  Ht 5\' 5"  (1.651 m)  Wt 225 lb (102.059 kg)  BMI 37.44 kg/m2  SpO2 100%   Physical Exam  Constitutional: She appears well-developed and well-nourished.  HENT:  Head: Normocephalic and atraumatic.  Eyes: Conjunctivae are normal. Right eye exhibits no discharge. Left eye exhibits no discharge.  Neck: Normal range of motion. Neck supple.  Cardiovascular: Normal rate, regular rhythm and normal heart sounds.   Pulmonary/Chest: Effort normal and breath sounds normal.  Abdominal: Soft. There is no tenderness.  Genitourinary: Rectal exam shows external hemorrhoid (large, no active bleeding, non-thrombosed) and internal hemorrhoid. Rectal exam shows no tenderness.  Dark stool, no bright red blood  Neurological: She is alert.  Skin: Skin is warm and dry.  Psychiatric: She has a normal mood and affect.  Nursing note and vitals reviewed.   ED Course  Procedures (including critical care time) Labs Review Labs Reviewed  COMPREHENSIVE METABOLIC PANEL - Abnormal; Notable for the following:    Glucose, Bld 283 (*)    BUN 63 (*)    Creatinine, Ser 2.39 (*)    Albumin 3.3 (*)    GFR calc non Af Amer 19 (*)    GFR calc Af Amer 22 (*)    All other components within normal limits  CBC - Abnormal; Notable for the following:    RBC 2.85 (*)    Hemoglobin 8.0 (*)    HCT 24.8 (*)    All other components within normal limits  POC OCCULT BLOOD, ED - Abnormal; Notable for the following:    Fecal Occult Bld POSITIVE (*)    All other components within normal limits  NO BLOOD PRODUCTS    Imaging Review No results found.   EKG Interpretation None       1:18 PM Patient seen and examined. Work-up initiated. Rectal exam performed with nurse chaperone.   Vital signs reviewed and are as  follows: BP 111/45 mmHg  Pulse 76  Temp(Src) 98.6 F (37 C) (Oral)  Resp 14  Ht 5\' 5"  (1.651 m)  Wt 225 lb (102.059 kg)  BMI 37.44 kg/m2  SpO2 100%  3:43 PM Patient seen previously by Dr. Ashok Cordia.   Will admit for GI bleeding.   MDM   Final diagnoses:  Acute GI bleeding   Stable GI bleed, admit.     Carlisle Cater, PA-C 07/05/14 [place], MD 07/07/14 1351

## 2014-07-05 NOTE — ED Notes (Signed)
Patient signed refusal of blood products form; sent to lab.

## 2014-07-05 NOTE — Progress Notes (Signed)
NURSING PROGRESS NOTE  Teresa Flowers [id number] Admission Data: 07/05/2014 6:16 PM Attending Provider: Louellen Molder, MD LOV:FIEPP,IRJJOAC, MD Code Status: FULL  Teresa Flowers is a 74 y.o. female patient admitted from ED:  -No acute distress noted.  -No complaints of shortness of breath.  -No complaints of chest pain.   Cardiac Monitoring: Box # 16 in place. Cardiac monitor yields:sinus tachycardia.  Blood pressure 122/85, pulse 107, temperature 98.7 F (37.1 C), temperature source Oral, resp. rate 18, height 5\' 5"  (1.651 m), weight 101.5 kg (223 lb 12.3 oz), SpO2 99 %.   IV Fluids:  IV in place, occlusive dsg intact without redness, IV cath forearm right, condition patent and no redness normal saline.   Allergies:  Review of patient's allergies indicates no known allergies.  Past Medical History:   has a past medical history of Essential hypertension; Vertigo; Hypothyroid; High cholesterol; Type 2 diabetes mellitus, uncontrolled, with renal complications; Anemia in chronic kidney disease; Stage III chronic kidney disease; Cerebrovascular disease; Obesity; Hypertensive urgency (12/16/2013); Refusal of blood product; TIA (transient ischemic attack); GERD (gastroesophageal reflux disease); and Intermediate coronary syndrome.  Past Surgical History:   has past surgical history that includes Cardiovascular stress test (10/29/13); Colonoscopy; Cyst excision; and left heart catheterization with coronary angiogram (N/A, 12/16/2013).  Social History:   reports that she quit smoking about 21 years ago. She has never used smokeless tobacco. She reports that she does not drink alcohol or use illicit drugs.  Skin: intact  Patient orientated to room. Information packet given to patient. Admission inpatient armband information verified with patient/family to include name and date of birth and placed on patient arm. Side rails up x 2, fall assessment and education completed with patient/family.  Patient able to verbalize understanding of risk associated with falls and verbalized understanding to call for assistance before getting out of bed. Call light within reach. Patient able to voice and demonstrate understanding of unit orientation instructions.    Will continue to evaluate and treat per MD orders.  Wallie Renshaw, RN

## 2014-07-05 NOTE — H&P (Signed)
[facility] History and Physical                                                                                    Teresa Flowers, is a 74 y.o. female  MRN: [id number]   DOB - 1940-08-11  Admit Date - 07/05/2014  Outpatient Primary MD for the patient is ASRES,ALEHEGN, MD  With History of -  Past Medical History  Diagnosis Date  . Essential hypertension   . Vertigo   . Hypothyroid   . High cholesterol   . Type 2 diabetes mellitus, uncontrolled, with renal complications   . Anemia in chronic kidney disease   . Stage III chronic kidney disease   . Cerebrovascular disease     prior infarcts seen on head CT per patient  . Obesity   . Hypertensive urgency 12/16/2013  . Refusal of blood product   . TIA (transient ischemic attack)   . GERD (gastroesophageal reflux disease)   . Intermediate coronary syndrome       Past Surgical History  Procedure Laterality Date  . Cardiovascular stress test  10/29/13    mildly abnoraml adenosine myoview EF 53%  . Colonoscopy    . Cyst excision    . Left heart catheterization with coronary angiogram N/A 12/16/2013    Procedure: LEFT HEART CATHETERIZATION WITH CORONARY ANGIOGRAM;  Surgeon: Jettie Booze, MD;  Location: [facility] CATH LAB;  Service: Cardiovascular;  Laterality: N/A;    in for   Chief Complaint  Patient presents with  . Rectal Bleeding     HPI  Teresa Flowers  is a 74 y.o. 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 She reports that she was feeling fine until yesterday morning when she woke up with increased dizziness and fever she felt poorly all morning until about noon when she began having small bowel movements with maroon blood. Between noon and 2 AM she had more than 10 of these small [place] bowel movements. She felt so poorly that she decided to come to the ER today. She has problems with significant acid reflux and  occasionally will take Nexium. She started taking an 81 mg aspirin each day last week, but denies other NSAID use. Interestingly her brother was just diagnosed with colon cancer at age 32 and her nephew was diagnosed with colon cancer on February 29 at age 34. The patient had a colonoscopy approximately 3 years ago in [facility]. 2 polyps were removed the patient states they were benign. She cannot remember whether or not she had an upper endoscopy.  In the ER her vital signs are stable. Her hemoglobin is 8. This has trended down from 10.2 in August 2015.  Her BUN is elevated at 63. Her creatinine is 2.39 compared to her baseline of 1.6. The EDP performed a rectal exam and found dark brown stool with maroon-colored blood.  Review of Systems   In addition to the HPI above,  No Headache, No changes with Vision or hearing, No problems swallowing food or Liquids, No Chest pain, Cough or Shortness of Breath, No Abdominal  pain, No Nausea or Vomiting 1or bruises, No new joints pains-aches,  No new weakness, tingling, numbness in any extremity, No recent weight gain or loss, A full 10 point Review of Systems was done, except as stated above, all other Review of Systems were negative.  Social History History  Substance Use Topics  . Smoking status: Former Smoker    Quit date: 04/25/1993  . Smokeless tobacco: Never Used  . Alcohol Use: No   lives at home with her husband. Is independent with ADLs despite balance deficits.   Family History Family History  Problem Relation Age of Onset  . Heart disease Mother   . Transient ischemic attack Brother   . Heart attack Daughter    brother with colon cancer at age 63. Nephew with colon cancer at age 58.   Prior to Admission medications   Medication Sig Start Date End Date Taking? Authorizing Provider  amLODipine (NORVASC) 5 MG tablet Take 5 mg by mouth daily.    Historical Provider, MD  Cholecalciferol (VITAMIN D PO) Take by mouth.    Historical  Provider, MD  insulin glargine (LANTUS) 100 UNIT/ML injection Inject 0.2 mLs (20 Units total) into the skin at bedtime. 12/17/13   Charlynne Cousins, MD  insulin lispro (HUMALOG) 100 UNIT/ML injection Inject 2-6 Units into the skin 3 (three) times daily before meals.     Historical Provider, MD  levothyroxine (SYNTHROID, LEVOTHROID) 88 MCG tablet Take 88 mcg by mouth daily before breakfast.    Historical Provider, MD  lisinopril (PRINIVIL,ZESTRIL) 20 MG tablet Take 20 mg by mouth daily.    Historical Provider, MD  meclizine (ANTIVERT) 25 MG tablet Take 25 mg by mouth 3 (three) times daily as needed for dizziness.    Historical Provider, MD  rosuvastatin (CRESTOR) 10 MG tablet Take 10 mg by mouth daily.    Historical Provider, MD    No Known Allergies  Physical Exam  Vitals  Blood pressure 154/92, pulse 86, temperature 98.6 F (37 C), temperature source Oral, resp. rate 16, height 5\' 5"  (1.651 m), weight 102.059 kg (225 lb), SpO2 100 %.   General:  well-developed, overweight, pleasant female,  lying in bed in NAD. She has hirsutism  Psych:  Normal affect and insight, Not Suicidal or Homicidal, Awake Alert, Oriented X 3.  Neuro:   No F.N deficits, ALL C.Nerves Intact, Strength 5/5 all 4 extremities, Sensation intact all 4 extremities.  ENT:  Ears and Eyes appear Normal, Conjunctivae clear, PER. Moist oral mucosa without erythema or exudates.  Neck:  Supple, No lymphadenopathy appreciated  Respiratory:  Symmetrical chest wall movement, Good air movement bilaterally, CTAB.  Cardiac:  RRR, slight systolic murmur detected, no LE edema noted, no JVD.    Abdomen:  Positive bowel sounds, Soft, Non tender, Non distended,  No masses appreciated  Skin:  No Cyanosis, Normal Skin Turgor, No Skin Rash or Bruise.  Extremities:  Able to move all 4. 5/5 strength in each,  no effusions.  Data Review  CBC  Recent Labs Lab 07/05/14 1152  WBC 8.5  HGB 8.0*  HCT 24.8*  PLT 274  MCV 87.0   MCH 28.1  MCHC 32.3  RDW 13.8    Chemistries   Recent Labs Lab 07/05/14 1152  NA 139  K 4.3  CL 108  CO2 21  GLUCOSE 283*  BUN 63*  CREATININE 2.39*  CALCIUM 9.0  AST 18  ALT 12  ALKPHOS 77  BILITOT 0.3     Imaging  results:   No results found.   Assessment & Plan  Principal Problem:   GI bleed Active Problems:   Essential hypertension   Hypothyroid   High cholesterol   Type 2 diabetes mellitus, uncontrolled, with renal complications   Stage III chronic kidney disease   Intermediate coronary syndrome   Acute blood loss anemia   Acute on chronic kidney failure   GI bleed with acute blood loss anemia in a Jehovah's Witness who refuses blood products. I'm not completely certain if this is an upper or lower GI bleed. BUN is elevated. Patient has problems with GERD symptoms.  gastroenterology has been consulted  We will check CBC every 12 utilizing pediatric tubes. Would recommend increased frequency of CBCs if she starts bleeding again.  Will place on clear liquid diet with IV protonix q 12. Will defer IV iron, etc to GI.  Acute on chronic kidney failure. Baseline creatinine 1.5-1.6.  Currently 2.39. U/A pending.  Will hold ACE-I and place on gentle IV hydration. BMET in AM  Hypothyroidism Continue synthroid replacment.  DM II Place on reduced dose of Lantus, and utilize SSI-S q 4 as she will be NPO after midnight.  HTN BP stable.  Continue amlodipine.  Hold lisinopril due to kidney failure.  Home medications Please reevaluate her home medications after pharmacy has completed the med rec.  DVT Prophylaxis: scds   AM Labs Ordered, also please review Full Orders  Family Communication:   None, patient is alert and orientated.  Code Status:  full Condition:  Guarded.  Time spent in minutes : Kelly,  PA-C on 07/05/2014 at 4:47 PM  Between 7am to 7pm - Pager - [phone]  After 7pm go to [email or web address] - password [email or web address]  And  look for the night coverage person covering me after hours  [facility]

## 2014-07-06 ENCOUNTER — Encounter (HOSPITAL_COMMUNITY)
Admission: EM | Disposition: A | Discharge status: Home Health | Payer: Self-pay | Source: Home / Self Care | Attending: Internal Medicine

## 2014-07-06 ENCOUNTER — Encounter (HOSPITAL_COMMUNITY): Payer: Self-pay | Admitting: *Deleted

## 2014-07-06 DIAGNOSIS — C180 Malignant neoplasm of cecum: Secondary | ICD-10-CM | POA: Diagnosis not present

## 2014-07-06 DIAGNOSIS — I10 Essential (primary) hypertension: Secondary | ICD-10-CM

## 2014-07-06 DIAGNOSIS — K921 Melena: Secondary | ICD-10-CM

## 2014-07-06 HISTORY — PX: ESOPHAGOGASTRODUODENOSCOPY: SHX5428

## 2014-07-06 LAB — GLUCOSE, CAPILLARY
GLUCOSE-CAPILLARY: 141 mg/dL — AB (ref 70–99)
GLUCOSE-CAPILLARY: 168 mg/dL — AB (ref 70–99)
GLUCOSE-CAPILLARY: 76 mg/dL (ref 70–99)
Glucose-Capillary: 136 mg/dL — ABNORMAL HIGH (ref 70–99)
Glucose-Capillary: 178 mg/dL — ABNORMAL HIGH (ref 70–99)
Glucose-Capillary: 355 mg/dL — ABNORMAL HIGH (ref 70–99)
Glucose-Capillary: 169 mg/dL — ABNORMAL HIGH (ref 70–99)

## 2014-07-06 LAB — CBC
HCT: 21.3 % — ABNORMAL LOW (ref 36.0–46.0)
HEMATOCRIT: 22.1 % — AB (ref 36.0–46.0)
HEMOGLOBIN: 7 g/dL — AB (ref 12.0–15.0)
Hemoglobin: 6.9 g/dL — CL (ref 12.0–15.0)
MCH: 28.2 pg (ref 26.0–34.0)
MCH: 27.6 pg (ref 26.0–34.0)
MCHC: 32.5 g/dL (ref 30.0–36.0)
MCHC: 31.7 g/dL (ref 30.0–36.0)
MCV: 86.5 fL (ref 78.0–100.0)
MCV: 87 fL (ref 78.0–100.0)
Platelets: 243 10*3/uL (ref 150–400)
Platelets: 247 10*3/uL (ref 150–400)
RBC: 2.45 MIL/uL — ABNORMAL LOW (ref 3.87–5.11)
RBC: 2.54 MIL/uL — AB (ref 3.87–5.11)
RDW: 13.9 % (ref 11.5–15.5)
RDW: 13.9 % (ref 11.5–15.5)
WBC: 6.3 10*3/uL (ref 4.0–10.5)
WBC: 6.9 10*3/uL (ref 4.0–10.5)

## 2014-07-06 LAB — BASIC METABOLIC PANEL
Anion gap: 7 (ref 5–15)
BUN: 54 mg/dL — ABNORMAL HIGH (ref 6–23)
CO2: 24 mmol/L (ref 19–32)
Calcium: 8.7 mg/dL (ref 8.4–10.5)
Chloride: 112 mmol/L (ref 96–112)
Creatinine, Ser: 1.87 mg/dL — ABNORMAL HIGH (ref 0.50–1.10)
GFR, EST AFRICAN AMERICAN: 29 mL/min — AB (ref >90)
GFR, EST NON AFRICAN AMERICAN: 25 mL/min — AB (ref >90)
GLUCOSE: 96 mg/dL (ref 70–99)
Potassium: 3.4 mmol/L — ABNORMAL LOW (ref 3.5–5.1)
Sodium: 143 mmol/L (ref 135–145)

## 2014-07-06 LAB — RETICULOCYTES
RBC.: 2.54 MIL/uL — ABNORMAL LOW (ref 3.87–5.11)
Retic Count, Absolute: 53.3 10*3/uL (ref 19.0–186.0)
Retic Ct Pct: 2.1 % (ref 0.4–3.1)

## 2014-07-06 SURGERY — EGD (ESOPHAGOGASTRODUODENOSCOPY)
Anesthesia: Moderate Sedation

## 2014-07-06 MED ORDER — DIPHENHYDRAMINE HCL 50 MG/ML IJ SOLN
INTRAMUSCULAR | Status: AC
  Filled 2014-07-06: qty 1

## 2014-07-06 MED ORDER — FENTANYL CITRATE 0.05 MG/ML IJ SOLN
INTRAMUSCULAR | Status: DC | PRN
  Administered 2014-07-06: 25 ug via INTRAVENOUS

## 2014-07-06 MED ORDER — DEXTROSE-NACL 5-0.9 % IV SOLN
INTRAVENOUS | Status: DC
  Administered 2014-07-06 (×3): via INTRAVENOUS
  Administered 2014-07-07: 1000 mL via INTRAVENOUS
  Administered 2014-07-08: 06:00:00 via INTRAVENOUS
  Administered 2014-07-08: 1000 mL via INTRAVENOUS
  Administered 2014-07-09 – 2014-07-10 (×2): via INTRAVENOUS

## 2014-07-06 MED ORDER — SODIUM CHLORIDE 0.9 % IV SOLN: Freq: Once | INTRAVENOUS | Status: DC

## 2014-07-06 MED ORDER — PEG-KCL-NACL-NASULF-NA ASC-C 100 G PO SOLR: 1.0000 | Freq: Once | ORAL | Status: DC

## 2014-07-06 MED ORDER — MIDAZOLAM HCL 10 MG/2ML IJ SOLN
INTRAMUSCULAR | Status: DC | PRN
  Administered 2014-07-06: 1 mg via INTRAVENOUS
  Administered 2014-07-06: 2 mg via INTRAVENOUS

## 2014-07-06 MED ORDER — PEG-KCL-NACL-NASULF-NA ASC-C 100 G PO SOLR
0.5000 | Freq: Once | ORAL | Status: AC
  Administered 2014-07-06: 100 g via ORAL
  Filled 2014-07-06: qty 1

## 2014-07-06 MED ORDER — MIDAZOLAM HCL 5 MG/ML IJ SOLN
INTRAMUSCULAR | Status: AC
  Filled 2014-07-06: qty 2

## 2014-07-06 MED ORDER — BUTAMBEN-TETRACAINE-BENZOCAINE 2-2-14 % EX AERO
INHALATION_SPRAY | CUTANEOUS | Status: DC | PRN
  Administered 2014-07-06: 2 via TOPICAL

## 2014-07-06 MED ORDER — FENTANYL CITRATE 0.05 MG/ML IJ SOLN
INTRAMUSCULAR | Status: AC
  Filled 2014-07-06: qty 2

## 2014-07-06 MED ORDER — PEG-KCL-NACL-NASULF-NA ASC-C 100 G PO SOLR
0.5000 | Freq: Once | ORAL | Status: AC
  Administered 2014-07-07: 100 g via ORAL

## 2014-07-06 NOTE — Progress Notes (Signed)
CRITICAL VALUE ALERT  Critical value received:  hemoglobin 6.9  Date of notification:  3/13  Time of notification:  10:11 AM   Critical value read back:Yes.    Nurse who received alert:  Barrie Lyme  MD notified (1st page):  Rizwan  Time of first page:  10:11 AM   MD notified (2nd page):  Time of second page:  Responding MD: Wynelle Cleveland  Time MD responded:  10:13

## 2014-07-06 NOTE — Progress Notes (Addendum)
[facility] Progress Note   Teresa Flowers YKD:[id number] DOB: 1941-03-16 DOA: 07/05/2014 PCP: Wallene Dales, MD  Brief narrative: Teresa Flowers is a 74 y.o. 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   Subjective: No further bleeding-  Assessment/Plan: Principal Problem:   GI bleed/ melena - cont PPI - will have EGd today- no further bleeding  Active Problems:   Acute blood loss anemia - declining blood as she is a Jehovah's wittness - Hb 6.9- cont to follow- she was symptomatic on admission- check orthostatics and replace volume with IVF    Essential hypertension - Norvasc    Hypothyroid - Levothyroxine    Type 2 diabetes mellitus, uncontrolled, with renal complications - Lantus and siding scale insulin    ARF on CKD Stage III chronic kidney disease - prerenal- Cr improved from admission - close to baseline which is about 1.5-1.7     Appt with PCP: requested Code Status: full code Family Communication:  Disposition Plan: home when stable - with HHPT DVT prophylaxis: SCDs Consultants:GI Procedures:EGD pending  Antibiotics: Anti-infectives    None      Objective: Filed Weights   07/05/14 1138 07/05/14 1708  Weight: 102.059 kg (225 lb) 101.5 kg (223 lb 12.3 oz)    Intake/Output Summary (Last 24 hours) at 07/06/14 1036 Last data filed at 07/06/14 0911  Gross per 24 hour  Intake 891.25 ml  Output   2150 ml  Net -1258.75 ml     Vitals Filed Vitals:   07/05/14 1600 07/05/14 1708 07/05/14 2220 07/06/14 0547  BP: 154/92 122/85 149/73 138/53  Pulse: 86 107  83  Temp:  98.7 F (37.1 C) 97.9 F (36.6 C) 98.4 F (36.9 C)  TempSrc:  Oral Oral Oral  Resp: 16 18 15 18   Height:  5\' 5"  (1.651 m)    Weight:  101.5 kg (223 lb 12.3 oz)    SpO2: 100% 99% 100% 100%     Exam:  General:  Pt is alert, not in acute distress  HEENT: No icterus, No thrush  Cardiovascular: regular rate and rhythm, S1/S2 No murmur  Respiratory: clear to auscultation bilaterally   Abdomen: Soft, +Bowel sounds, non tender, non distended, no guarding  MSK: No LE edema, cyanosis or clubbing  Data Reviewed: Basic Metabolic Panel:  Recent Labs Lab 07/05/14 1152 07/06/14 0625  NA 139 143  K 4.3 3.4*  CL 108 112  CO2 21 24  GLUCOSE 283* 96  BUN 63* 54*  CREATININE 2.39* 1.87*  CALCIUM 9.0 8.7   Liver Function Tests:  Recent Labs Lab 07/05/14 1152  AST 18  ALT 12  ALKPHOS 77  BILITOT 0.3  PROT 6.7  ALBUMIN 3.3*   No results for input(s): LIPASE, AMYLASE in the last 168 hours. No results for input(s): AMMONIA in the last 168 hours. CBC:  Recent Labs Lab 07/05/14 1152 07/05/14 1940 07/06/14 0625  WBC 8.5 11.1* 6.3  HGB 8.0* 8.1* 6.9*  HCT 24.8* 24.1* 21.3*  MCV 87.0 84.3 86.5  PLT 274 257 243   Cardiac Enzymes: No results for input(s): CKTOTAL, CKMB, CKMBINDEX, TROPONINI in the last 168 hours. BNP (last 3 results) No results for input(s): BNP in the last 8760 hours.  ProBNP (last 3 results) No results for input(s): PROBNP in the last 8760 hours.  CBG:  Recent Labs Lab 07/05/14 1744 07/05/14 2041 07/06/14 0012 07/06/14 0423 07/06/14 0810  GLUCAP 203* 260* 141* 76 136*    No results found for this or any previous visit (from the past 240 hour(s)).   Studies:  Recent x-ray studies have been reviewed in detail by the Attending Physician  Scheduled Meds:  Scheduled Meds: . sodium chloride   Intravenous Once  . amLODipine  5 mg Oral Daily  . insulin aspart  0-9 Units Subcutaneous 6 times per day  . insulin glargine  10 Units Subcutaneous QHS  . levothyroxine  88 mcg Oral QAC breakfast  . pantoprazole (PROTONIX) IV  40 mg Intravenous Q12H  . sodium chloride  3 mL Intravenous Q12H   Continuous Infusions: . dextrose 5 % and  0.9% NaCl 75 mL/hr at 07/06/14 1022    Time spent on care of this patient: 35 min   Camp Sherman, MD 07/06/2014, 10:36 AM  LOS: 1 day   [facility] Office  [phone] Pager - Text Page per [email or web address]  If 7PM-7AM, please contact night-coverage [email or web address]

## 2014-07-06 NOTE — Evaluation (Signed)
Physical Therapy Evaluation Patient Details Name: Teresa Flowers MRN: [id number] DOB: 15-Jun-1940 Today's Date: 07/06/2014   History of Present Illness  Pt admit with GIB.    Clinical Impression  Pt admitted with above diagnosis. Pt currently with functional limitations due to the deficits listed below (see PT Problem List).  Pt states her ambulation is close to baseline status.  A little unsteady but uses cane at home.  Recommend HHPT safety eval and HHOT to assess pts bathroom as pt can't bathe in tub/shower and needs OT to assess and make recommendations.  Will follow acutely.  Pt will benefit from skilled PT to increase their independence and safety with mobility to allow discharge to the venue listed below.      Follow Up Recommendations Home health PT;Supervision/Assistance - 24 hour (HHOT to assess bath equipment)    Equipment Recommendations  None recommended by PT    Recommendations for Other Services       Precautions / Restrictions Precautions Precautions: Fall Restrictions Weight Bearing Restrictions: No      Mobility  Bed Mobility               General bed mobility comments: Pt in chair on arrival.   Transfers Overall transfer level: Needs assistance Equipment used: None Transfers: Sit to/from Stand Sit to Stand: Supervision            Ambulation/Gait Ambulation/Gait assistance: Supervision Ambulation Distance (Feet): 400 Feet Assistive device: None Gait Pattern/deviations: Step-through pattern;Decreased stride length   Gait velocity interpretation: <1.8 ft/sec, indicative of risk for recurrent falls General Gait Details: Pt ambulated well without device with occasional incr step to right or left however no LOB with pt able to self correct.  Feel that if pt uses her cane she will be safe at home.  Pt was able to withstand challenges to balance.    Stairs            Wheelchair Mobility    Modified Rankin (Stroke Patients Only)        Balance Overall balance assessment: Needs assistance         Standing balance support: No upper extremity supported;During functional activity Standing balance-Leahy Scale: Fair Standing balance comment: can stand statically without assist.               High level balance activites: Direction changes;Turns;Sudden stops;Head turns High Level Balance Comments: Pt with slight instability with higher level activities however pt can self correct.              Pertinent Vitals/Pain Pain Assessment: No/denies pain  VSS    Home Living Family/patient expects to be discharged to:: Private residence Living Arrangements: Spouse/significant other;Children (husband and daughter at home, husband 24 hours) Available Help at Discharge: Family;Available 24 hours/day Type of Home: House Home Access: Stairs to enter Entrance Stairs-Rails: None Entrance Stairs-Number of Steps: 1 Home Layout: One level Home Equipment: Cane - single point Additional Comments: Pt states she can't really bathe in tub/shower and needs some equipment.     Prior Function Level of Independence: Independent with assistive device(s)         Comments: Used cane PTA per pt.  Staggers at times but has always caught herself.       Hand Dominance        Extremity/Trunk Assessment   Upper Extremity Assessment: Defer to OT evaluation           Lower Extremity Assessment: Overall WFL for tasks assessed  Cervical / Trunk Assessment: Normal  Communication   Communication: No difficulties  Cognition Arousal/Alertness: Awake/alert Behavior During Therapy: WFL for tasks assessed/performed Overall Cognitive Status: Within Functional Limits for tasks assessed                      General Comments      Exercises        Assessment/Plan    PT Assessment Patient needs continued PT services  PT Diagnosis Generalized weakness   PT Problem List Decreased activity tolerance;Decreased  balance;Decreased mobility;Decreased knowledge of use of DME;Decreased knowledge of precautions;Decreased safety awareness  PT Treatment Interventions DME instruction;Gait training;Stair training;Therapeutic activities;Therapeutic exercise;Balance training;Patient/family education   PT Goals (Current goals can be found in the Care Plan section) Acute Rehab PT Goals Patient Stated Goal: to go home PT Goal Formulation: With patient Time For Goal Achievement: 07/13/14 Potential to Achieve Goals: Good    Frequency Min 3X/week   Barriers to discharge        Co-evaluation               End of Session Equipment Utilized During Treatment: Gait belt Activity Tolerance: Patient tolerated treatment well Patient left: in chair;with call bell/phone within reach;with chair alarm set Nurse Communication: Mobility status         Time: 5997-7414 PT Time Calculation (min) (ACUTE ONLY): 16 min   Charges:   PT Evaluation $Initial PT Evaluation Tier I: 1 Procedure     PT G CodesDenice Paradise Jul 11, 2014, 11:29 AM Amanda Cockayne Acute Rehabilitation [phone] (pager)

## 2014-07-06 NOTE — Op Note (Signed)
[facility] [place], [address]   ENDOSCOPY PROCEDURE REPORT  PATIENT: Teresa, Flowers  MR#: [id number] BIRTHDATE: Feb 07, 1941 , 73  yrs. old GENDER: female ENDOSCOPIST: Jerene Bears, MD REFERRED BY:  [facility] PROCEDURE DATE:  07/06/2014 PROCEDURE:  EGD, diagnostic ASA CLASS:     Class III INDICATIONS:  melena. acute on chronic anemia MEDICATIONS: Fentanyl 25 mcg IV and Versed 3 mg IV TOPICAL ANESTHETIC: Cetacaine Spray  DESCRIPTION OF PROCEDURE: After the risks benefits and alternatives of the procedure were thoroughly explained, informed consent was obtained.  The PENTAX GASTROSCOPE [id number] endoscope was introduced through the mouth and advanced to the second portion of the duodenum , Without limitations.  The instrument was slowly withdrawn as the mucosa was fully examined.      EXAM: The esophagus and gastroesophageal junction were completely normal in appearance.  The stomach was entered and closely examined.The antrum, angularis, and lesser curvature were well visualized, including a retroflexed view of the cardia and fundus. The stomach wall was normally distensible.  The scope passed easily through the pylorus into the duodenum.  Retroflexed views revealed no abnormalities.     The scope was then withdrawn from the patient and the procedure completed.  COMPLICATIONS: There were no immediate complications.  ENDOSCOPIC IMPRESSION: Normal EGD  RECOMMENDATIONS: 1.  Given melena and drop in Hgb recommend colonoscopy 2.  Monitor Hgb closely  eSigned:  Jerene Bears, MD 07/06/2014 1:10 PM    CC: the patient

## 2014-07-06 NOTE — Progress Notes (Signed)
PT note PT evaluation complete and full note to follow.  Pt needs HHPT f/u for balance and endurance training as well as HHOT f/u to assess bath equipment.  Has a cane and does not need any other mobility equipment at present.  Will follow acutely.  Thanks.  [place] [phone] (pager)

## 2014-07-06 NOTE — Progress Notes (Signed)
UR Completed.  [phone]  

## 2014-07-06 NOTE — Consult Note (Signed)
Referring Provider:[facility] Primary Care Physician:  Wallene Dales, MD Primary Gastroenterologist:  unassigned  Reason for Consultation:   GI bleed     HPI: Teresa Flowers is a 74 y.o. female who is followed by Dr. Joetta Manners at [place] in [place]. She has a history of hypothyroidism, CVA with ongoing balance deficits, Type 2 diabetes, GERD, high cholesterol, hypertension, and chronic kidney disease stage III. She reports that for the past week and a half she has been feeling weaker than she normally does and has been fatiguing easily. For the past 5 days she noticed her bowel movements have been dark and yesterday she had 10 small dark tarry bowel movements. She began to feel very weak and came to the emergency room. Her hemoglobin was noted to be a gist down from 10.2 in August 2015. Her creatinine was elevated to 2.39 elevated from her baseline of 1.6. BUN is 63. Rectal exam in the emergency room revealed dark stool and maroon-colored blood. Patient reports that she had a colonoscopy by Dr. Truman Hayward of [facility] at the endoscopy center in [facility] in [facility] 3 years ago. She states she had 2 polyps removed which were benign. She has a brother who was recently diagnosed with colon cancer at age 16 and a nephew diagnosed with colon cancer at age 66. She does not recall ever having an upper endoscopy in the past and has no known history of ulcers. She has no epigastric pain, nausea, or vomiting. She uses baby aspirin but has not been on any prescription anticoagulants or antiplatelet medications.She denies NSAID use. Pt is Jehovahs witness and does not want blood products. Past Medical History  Diagnosis Date  . Essential hypertension   . Vertigo   . Hypothyroid   . High cholesterol   . Type 2 diabetes mellitus, uncontrolled, with renal complications   . Anemia in chronic kidney disease   . Stage III chronic kidney disease   .  Cerebrovascular disease     prior infarcts seen on head CT per patient  . Obesity   . Hypertensive urgency 12/16/2013  . Refusal of blood product   . TIA (transient ischemic attack)   . GERD (gastroesophageal reflux disease)   . Intermediate coronary syndrome     Past Surgical History  Procedure Laterality Date  . Cardiovascular stress test  10/29/13    mildly abnoraml adenosine myoview EF 53%  . Colonoscopy    . Cyst excision    . Left heart catheterization with coronary angiogram N/A 12/16/2013    Procedure: LEFT HEART CATHETERIZATION WITH CORONARY ANGIOGRAM;  Surgeon: Jettie Booze, MD;  Location: [facility] CATH LAB;  Service: Cardiovascular;  Laterality: N/A;    Prior to Admission medications   Medication Sig Start Date End Date Taking? Authorizing Provider  amLODipine (NORVASC) 5 MG tablet Take 5 mg by mouth daily.   Yes Historical Provider, MD  insulin glargine (LANTUS) 100 UNIT/ML injection Inject 0.2 mLs (20 Units total) into the skin at bedtime. Patient taking differently: Inject 20 Units into the skin at bedtime. <200 pt does not take 12/17/13  Yes Charlynne Cousins, MD  insulin lispro (HUMALOG) 100 UNIT/ML injection Inject 2-6 Units into the skin 3 (three) times daily before meals.    Yes Historical Provider, MD  levothyroxine (SYNTHROID, LEVOTHROID) 88 MCG tablet Take 88 mcg by mouth daily before breakfast.   Yes Historical Provider, MD  lisinopril (PRINIVIL,ZESTRIL) 20 MG tablet Take 20 mg by  mouth daily.   Yes Historical Provider, MD  rosuvastatin (CRESTOR) 10 MG tablet Take 10 mg by mouth at bedtime.    Yes Historical Provider, MD  B-D ULTRAFINE III SHORT PEN 31G X 8 MM MISC  06/19/14   Historical Provider, MD  meclizine (ANTIVERT) 25 MG tablet Take 25 mg by mouth 3 (three) times daily as needed for dizziness.    Historical Provider, MD  [facility] VERIO test strip  06/18/14   Historical Provider, MD  Vitamin D, Ergocalciferol, (DRISDOL) 50000 UNITS CAPS capsule Take 50,000  Units by mouth once a week. 06/26/14   Historical Provider, MD    Current Facility-Administered Medications  Medication Dose Route Frequency Provider Last Rate Last Dose  . acetaminophen (TYLENOL) tablet 650 mg  650 mg Oral Q6H PRN Melton Alar, PA-C       Or  . acetaminophen (TYLENOL) suppository 650 mg  650 mg Rectal Q6H PRN Melton Alar, PA-C      . amLODipine (NORVASC) tablet 5 mg  5 mg Oral Daily Marianne L York, PA-C      . dextrose 5 %-0.9 % sodium chloride infusion   Intravenous Continuous Ritta Slot, NP 75 mL/hr at 07/06/14 0454    . insulin aspart (novoLOG) injection 0-9 Units  0-9 Units Subcutaneous 6 times per day Melton Alar, PA-C   1 Units at 07/06/14 0039  . insulin glargine (LANTUS) injection 10 Units  10 Units Subcutaneous QHS Melton Alar, PA-C   10 Units at 07/05/14 2106  . levothyroxine (SYNTHROID, LEVOTHROID) tablet 88 mcg  88 mcg Oral QAC breakfast Melton Alar, PA-C      . ondansetron [facility]) tablet 4 mg  4 mg Oral Q6H PRN Melton Alar, PA-C       Or  . ondansetron (ZOFRAN) injection 4 mg  4 mg Intravenous Q6H PRN Melton Alar, PA-C      . pantoprazole (PROTONIX) injection 40 mg  40 mg Intravenous Q12H Melton Alar, PA-C   40 mg at 07/05/14 2106  . pneumococcal 23 valent vaccine (PNU-IMMUNE) injection 0.5 mL  0.5 mL Intramuscular Tomorrow-1000 Nishant Dhungel, MD      . polyethylene glycol (MIRALAX / GLYCOLAX) packet 17 g  17 g Oral Daily PRN Melton Alar, PA-C      . sodium chloride 0.9 % injection 3 mL  3 mL Intravenous Q12H Melton Alar, PA-C   3 mL at 07/05/14 2200    Allergies as of 07/05/2014  . (No Known Allergies)    Family History  Problem Relation Age of Onset  . Heart disease Mother   . Transient ischemic attack Brother   . Heart attack Daughter     History   Social History  . Marital Status: Divorced    Spouse Name: N/A  . Number of Children: N/A  . Years of Education: N/A   Occupational History  . Not on  file.   Social History Main Topics  . Smoking status: Former Smoker    Quit date: 04/25/1993  . Smokeless tobacco: Never Used  . Alcohol Use: No  . Drug Use: No  . Sexual Activity: Not on file   Other Topics Concern  . Not on file   Social History Narrative    Review of Systems: KDT:OIZTI weak CV: Denies chest pain, angina, palpitations, syncope, orthopnea, PND, peripheral edema, and claudication. Resp:  Has dyspnea with exertion. GI: Denies vomiting blood, jaundice, and fecal incontinence.  Denies dysphagia or odynophagia. GU : Denies urinary burning, blood in urine, urinary frequency, urinary hesitancy, nocturnal urination, and urinary incontinence. MS: Denies joint pain, limitation of movement, and swelling, stiffness, low back pain, extremity pain. Denies muscle weakness, cramps, atrophy.  Derm: Denies rash, itching, dry skin, hives, moles, warts, or unhealing ulcers.  Psych: Denies depression, anxiety, memory loss, suicidal ideation, hallucinations, paranoia, and confusion. Heme: Denies bruising, bleeding, and enlarged lymph nodes. Neuro:  Denies any headaches,  paresthesias. Endo: Has DM Physical Exam: Vital signs in last 24 hours: Temp:  [97.9 F (36.6 C)-98.7 F (37.1 C)] 98.4 F (36.9 C) (03/13 0547) Pulse Rate:  [76-107] 83 (03/13 0547) Resp:  [12-18] 18 (03/13 0547) BP: (105-155)/(45-92) 138/53 mmHg (03/13 0547) SpO2:  [99 %-100 %] 100 % (03/13 0547) Weight:  [223 lb 12.3 oz (101.5 kg)-225 lb (102.059 kg)] 223 lb 12.3 oz (101.5 kg) (03/12 1708) Last BM Date: 07/05/14 General:   Alert,  Well-developed, well-nourished, pleasant abald female in NAD Head:  Normocephalic and atraumatic. Eyes:  Sclera clear, no icterus.   Conjunctiva pale Ears:  Normal auditory acuity. Nose:  No deformity, discharge,  or lesions. Mouth:  No deformity or lesions.   Neck:  Supple; no masses or thyromegaly. Lungs:  Clear throughout to auscultation.   No wheezes, crackles, or  rhonchi.  Heart:  Regular rate and LKHVFM;7/3 systolic murmur Abdomen:  Soft,nontender, BS active,nonpalp mass or hsm.   Rectal:  Deferred  Msk:  Symmetrical without gross deformities. . Pulses:  Normal pulses noted. Extremities:  Without clubbing or edema. Neurologic:  Alert and  oriented x4;  grossly normal neurologically. Skin:  Intact without significant lesions or rashes.. Psych:  Alert and cooperative. Normal mood and affect.  Intake/Output from previous day: 03/12 0701 - 03/13 0700 In: 891.3 [I.V.:891.3] Out: 1550 [Urine:1550] Intake/Output this shift:    Lab Results:  Recent Labs  07/05/14 1152 07/05/14 1940  WBC 8.5 11.1*  HGB 8.0* 8.1*  HCT 24.8* 24.1*  PLT 274 257   BMET  Recent Labs  07/05/14 1152  NA 139  K 4.3  CL 108  CO2 21  GLUCOSE 283*  BUN 63*  CREATININE 2.39*  CALCIUM 9.0   LFT  Recent Labs  07/05/14 1152  PROT 6.7  ALBUMIN 3.3*  AST 18  ALT 12  ALKPHOS 77  BILITOT 0.3       IMPRESSION/PLAN: 74 year old female admitted with melena likely from upper GI source has BUN is elevated. Work and he stools may be the result of a brisk bleed. We'll keep patient nothing by mouth and plan on EGD later this morning. Would continue IV hydration and supportive measures .we'll recheck CBC and if below 8 will consider iron replacement.    Harish Bram, Deloris Ping 07/06/2014,  Pager [phone]

## 2014-07-07 ENCOUNTER — Encounter (HOSPITAL_COMMUNITY)
Admission: EM | Disposition: A | Discharge status: Home Health | Payer: Self-pay | Source: Home / Self Care | Attending: Internal Medicine

## 2014-07-07 ENCOUNTER — Encounter (HOSPITAL_COMMUNITY): Payer: Self-pay | Admitting: Internal Medicine

## 2014-07-07 DIAGNOSIS — K6389 Other specified diseases of intestine: Secondary | ICD-10-CM

## 2014-07-07 DIAGNOSIS — D122 Benign neoplasm of ascending colon: Secondary | ICD-10-CM

## 2014-07-07 DIAGNOSIS — C189 Malignant neoplasm of colon, unspecified: Secondary | ICD-10-CM

## 2014-07-07 DIAGNOSIS — D125 Benign neoplasm of sigmoid colon: Secondary | ICD-10-CM

## 2014-07-07 HISTORY — PX: COLONOSCOPY: SHX5424

## 2014-07-07 LAB — GLUCOSE, CAPILLARY
GLUCOSE-CAPILLARY: 138 mg/dL — AB (ref 70–99)
Glucose-Capillary: 140 mg/dL — ABNORMAL HIGH (ref 70–99)
Glucose-Capillary: 138 mg/dL — ABNORMAL HIGH (ref 70–99)
Glucose-Capillary: 177 mg/dL — ABNORMAL HIGH (ref 70–99)
Glucose-Capillary: 92 mg/dL (ref 70–99)
Glucose-Capillary: 264 mg/dL — ABNORMAL HIGH (ref 70–99)

## 2014-07-07 LAB — VITAMIN B12: Vitamin B-12: 934 pg/mL — ABNORMAL HIGH (ref 211–911)

## 2014-07-07 LAB — BASIC METABOLIC PANEL
ANION GAP: 7 (ref 5–15)
BUN: 33 mg/dL — ABNORMAL HIGH (ref 6–23)
CHLORIDE: 117 mmol/L — AB (ref 96–112)
CO2: 20 mmol/L (ref 19–32)
Calcium: 9 mg/dL (ref 8.4–10.5)
Creatinine, Ser: 1.66 mg/dL — ABNORMAL HIGH (ref 0.50–1.10)
GFR calc non Af Amer: 29 mL/min — ABNORMAL LOW (ref >90)
GFR, EST AFRICAN AMERICAN: 34 mL/min — AB (ref >90)
Glucose, Bld: 135 mg/dL — ABNORMAL HIGH (ref 70–99)
POTASSIUM: 4.1 mmol/L (ref 3.5–5.1)
Sodium: 144 mmol/L (ref 135–145)

## 2014-07-07 LAB — FOLATE: Folate: 17.4 ng/mL

## 2014-07-07 LAB — CBC
HEMATOCRIT: 24.2 % — AB (ref 36.0–46.0)
Hemoglobin: 7.7 g/dL — ABNORMAL LOW (ref 12.0–15.0)
MCH: 27.9 pg (ref 26.0–34.0)
MCHC: 31.8 g/dL (ref 30.0–36.0)
MCV: 87.7 fL (ref 78.0–100.0)
PLATELETS: 303 10*3/uL (ref 150–400)
RBC: 2.76 MIL/uL — ABNORMAL LOW (ref 3.87–5.11)
RDW: 14.2 % (ref 11.5–15.5)
WBC: 6.7 10*3/uL (ref 4.0–10.5)

## 2014-07-07 LAB — IRON AND TIBC
Iron: 41 ug/dL — ABNORMAL LOW (ref 42–145)
Saturation Ratios: 19 % — ABNORMAL LOW (ref 20–55)
TIBC: 211 ug/dL — ABNORMAL LOW (ref 250–470)
UIBC: 170 ug/dL (ref 125–400)

## 2014-07-07 LAB — FERRITIN: Ferritin: 38 ng/mL (ref 10–291)

## 2014-07-07 SURGERY — COLONOSCOPY
Anesthesia: Moderate Sedation

## 2014-07-07 MED ORDER — MIDAZOLAM HCL 5 MG/5ML IJ SOLN
INTRAMUSCULAR | Status: DC | PRN
  Administered 2014-07-07 (×3): 1 mg via INTRAVENOUS
  Administered 2014-07-07: 2 mg via INTRAVENOUS

## 2014-07-07 MED ORDER — MIDAZOLAM HCL 5 MG/ML IJ SOLN
INTRAMUSCULAR | Status: AC
  Filled 2014-07-07: qty 2

## 2014-07-07 MED ORDER — DIPHENHYDRAMINE HCL 50 MG/ML IJ SOLN
INTRAMUSCULAR | Status: AC
  Filled 2014-07-07: qty 1

## 2014-07-07 MED ORDER — FENTANYL CITRATE 0.05 MG/ML IJ SOLN
INTRAMUSCULAR | Status: AC
  Filled 2014-07-07: qty 2

## 2014-07-07 MED ORDER — FENTANYL CITRATE 0.05 MG/ML IJ SOLN
INTRAMUSCULAR | Status: DC | PRN
  Administered 2014-07-07 (×2): 25 ug via INTRAVENOUS

## 2014-07-07 NOTE — Progress Notes (Signed)
Patient with order for clear liquid diet, asked for regular diet. Nurse explained and educated patient about her diet, that GI doctor recommended for dinner to have clear liquid diet. During the night report, day nurse and night nurse noticed patient eating regular food that her family bouhgt from outside. MD notified. Will continue to monitor.

## 2014-07-07 NOTE — Progress Notes (Signed)
NCM spoke with patient she chose [facility] for hhpt/hhot, referral made to [employer] notified.  Soc will begin 24-48 hrs post dc.

## 2014-07-07 NOTE — Progress Notes (Signed)
Patient back from Endo. Alert and oriented; no complaints of pain or other distress. Will continue to monitor.

## 2014-07-07 NOTE — Progress Notes (Signed)
Medicare Important Message given?  YES (If response is "NO", the following Medicare IM given date fields will be blank) Date Medicare IM given:  07/07/14 Medicare IM given by:  Tomi Bamberger

## 2014-07-07 NOTE — Op Note (Signed)
[facility] [place], [address]   COLONOSCOPY PROCEDURE REPORT  PATIENT: Teresa Flowers, Teresa Flowers  MR#: [id number] BIRTHDATE: Jul 14, 1940 , 73  yrs. old GENDER: female ENDOSCOPIST: Eustace Quail, MD REFERRED MB:[id number] PROCEDURE DATE:  07/07/2014 PROCEDURE:   Colonoscopy with biopsy and Colonoscopy with snare polypectomy x 7 First Screening Colonoscopy - Avg.  risk and is 50 yrs.  old or older - No.  Prior Negative Screening - Now for repeat screening. N/A  History of Adenoma - Now for follow-up colonoscopy & has been > or = to 3 yrs.  N/A ASA CLASS:   Class II INDICATIONS:melena.   . Negative EGD. Reports colonoscopy elsewhere about 3 years ago (no details) MEDICATIONS: Fentanyl 50 mcg IV and Versed 5 mg IV  DESCRIPTION OF PROCEDURE:   After the risks benefits and alternatives of the procedure were thoroughly explained, informed consent was obtained.  The digital rectal exam revealed no abnormalities of the rectum.   The [employer]ax Adult Colon [id number] endoscope was introduced through the anus and advanced to the cecum, which was identified by both the appendix and ileocecal valve. No adverse events experienced.   The quality of the prep was excellent.  (MoviPrep was used)  The instrument was then slowly withdrawn as the colon was fully examined.  COLON FINDINGS: A 4 cm pancake shaped mass was found at the cecum, 180 from the ileocecal valve and downstream 2 cm. Multiple biopsies taken.   Seven polyps ranging between 5-69mm in size were found in the transverse (3), sigmoid, cecum (2), and the ascending colon.  A polypectomy was performed with a cold snare.  The resection was complete, the polyp tissue was completely retrieved and sent to histology.   There was mild diverticulosis in the sigmoid colon.   The examination was otherwise normal.  Retroflexed views revealed no abnormalities. The time to cecum = 5 Withdrawal time = 18   The  scope was withdrawn and the procedure completed. COMPLICATIONS: There were no immediate complications.  ENDOSCOPIC IMPRESSION: 1.   Mass was found at the cecum , status post biopsies 2.   Seven polyps were found in the colon; polypectomy was performed with a cold snare 3.   Mild diverticulosis was noted in the sigmoid colon 4.   The examination was otherwise normal  RECOMMENDATIONS: 1.  Await biopsy results 2.  General Surgery consultation "cecal cancer, right hemicolectomy" 3.  Repeat Colonoscopy in 1 year, with her primary GI doctor or Dr. Hilarie Fredrickson.  eSigned:  Eustace Quail, MD 07/07/2014 4:17 PM   cc: Zenovia Jarred MD and The Patient   PATIENT NAME:  Teresa Flowers, Teresa Flowers MR#: [id number]

## 2014-07-07 NOTE — Care Management Note (Signed)
    Page 1 of 1   07/07/2014     3:59:38 PM CARE MANAGEMENT NOTE 07/07/2014  Patient:  MATEJA, DIER   Account Number:  [id number]  Date Initiated:  07/06/2014  Documentation initiated by:  [facility]  Subjective/Objective Assessment:   adm: presenting with acute GI bleed with some drop in H&P     Action/Plan:   discharge planning   Anticipated DC Date:  07/07/2014   Anticipated DC Plan:  [place]  CM consult      [facility] Choice  HOME HEALTH   Choice offered to / List presented to:  C-1 Patient        [place] arranged  [place].   Status of service:  Completed, signed off Medicare Important Message given?  YES (If response is "NO", the following Medicare IM given date fields will be blank) Date Medicare IM given:  07/07/2014 Medicare IM given by:  Tomi Bamberger Date Additional Medicare IM given:   Additional Medicare IM given by:    Discharge Disposition:  [place]  Per UR Regulation:    If discussed at Long Length of Stay Meetings, dates discussed:    Comments:  07/06/14 14:55 Cm spoke with pt to offer choice of home health agency.  Pt chooses [facility] to render HHPT/OT.  Address and contact information verified by pt.  Referral called to [facility] rep, Emma.  No other CM needs were comunicated.  Mariane Masters, BSN, CM [phone].

## 2014-07-07 NOTE — Progress Notes (Signed)
[facility] Progress Note   PANDA CROSSIN BPZ:[id number] DOB: 1940-06-25 DOA: 07/05/2014 PCP: Wallene Dales, MD  Brief narrative: Teresa Flowers is a 74 y.o. 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   Subjective: Patient evaluated this AM- she had no complaints other than feeling hungry.   Assessment/Plan: Principal Problem:   GI bleed/ melena - EGD unrevealing- Colonoscopy shows a 4 cm mass in right colon- GI has done biopsies and are recommending a gen surgery consult which I have requested - no further bleeding thus far  Active Problems:   Acute blood loss anemia - declining blood as she is a Jehovah's wittness - Hb 7.7- cont to follow- she was symptomatic on admission and continues to be asymptomatic - will d/c fluids once taking a diet - has been NPO for scopes.     Essential hypertension - Norvasc    Hypothyroid - Levothyroxine    Type 2 diabetes mellitus, uncontrolled, with renal complications - Lantus and siding scale insulin- sugars stable thus far    ARF on CKD Stage III chronic kidney disease - prerenal- Cr improved from admission - close to baseline which is about 1.5-1.7     Appt with PCP: requested Code Status: full code Family Communication:  Disposition Plan: home when stable - with HHPT DVT prophylaxis: SCDs Consultants:GI Procedures:EGD pending  Antibiotics: Anti-infectives    None      Objective: Filed Weights   07/05/14 1138 07/05/14 1708  Weight: 102.059 kg (225 lb) 101.5 kg (223 lb 12.3 oz)    Intake/Output Summary (Last 24 hours) at 07/07/14 1648 Last data filed at 07/07/14 0840  Gross per 24 hour  Intake   1005 ml  Output   1850 ml  Net   -845 ml     Vitals Filed Vitals:   07/07/14 1605 07/07/14 1615 07/07/14 1634 07/07/14 1637   BP: 122/28 146/52 90/57 135/60  Pulse: 65  66   Temp:      TempSrc:      Resp: 16 14 16 18   Height:      Weight:      SpO2: 100% 100% 100% 100%    Exam:  General:  Pt is alert, not in acute distress  HEENT: No icterus, No thrush  Cardiovascular: regular rate and rhythm, S1/S2 No murmur  Respiratory: clear to auscultation bilaterally   Abdomen: Soft, +Bowel sounds, non tender, non distended, no guarding  MSK: No LE edema, cyanosis or clubbing  Data Reviewed: Basic Metabolic Panel:  Recent Labs Lab 07/05/14 1152 07/06/14 0625 07/07/14 0744  NA 139 143 144  K 4.3 3.4* 4.1  CL 108 112 117*  CO2 21 24 20   GLUCOSE 283* 96 135*  BUN 63* 54* 33*  CREATININE 2.39* 1.87* 1.66*  CALCIUM 9.0 8.7 9.0   Liver Function Tests:  Recent Labs Lab 07/05/14 1152  AST 18  ALT 12  ALKPHOS 77  BILITOT 0.3  PROT 6.7  ALBUMIN 3.3*   No results for input(s): LIPASE, AMYLASE in the last 168 hours. No results for input(s): AMMONIA in the last 168 hours. CBC:  Recent Labs Lab 07/05/14 1152 07/05/14 1940 07/06/14 0625 07/06/14 1639 07/07/14 0744  WBC 8.5 11.1* 6.3 6.9 6.7  HGB 8.0* 8.1* 6.9* 7.0* 7.7*  HCT 24.8* 24.1* 21.3* 22.1* 24.2*  MCV 87.0  84.3 86.5 87.0 87.7  PLT 274 257 243 247 303   Cardiac Enzymes: No results for input(s): CKTOTAL, CKMB, CKMBINDEX, TROPONINI in the last 168 hours. BNP (last 3 results) No results for input(s): BNP in the last 8760 hours.  ProBNP (last 3 results) No results for input(s): PROBNP in the last 8760 hours.  CBG:  Recent Labs Lab 07/06/14 2202 07/07/14 0011 07/07/14 0518 07/07/14 0817 07/07/14 1209  GLUCAP 169* 177* 138* 140* 138*    No results found for this or any previous visit (from the past 240 hour(s)).   Studies:  Recent x-ray studies have been reviewed in detail by the Attending Physician  Scheduled Meds:  Scheduled Meds: . sodium chloride   Intravenous Once  . amLODipine  5 mg Oral Daily  . insulin  aspart  0-9 Units Subcutaneous 6 times per day  . insulin glargine  10 Units Subcutaneous QHS  . levothyroxine  88 mcg Oral QAC breakfast  . sodium chloride  3 mL Intravenous Q12H   Continuous Infusions: . dextrose 5 % and 0.9% NaCl 1,000 mL (07/07/14 1248)    Time spent on care of this patient: 35 min   [place], MD 07/07/2014, 4:48 PM  LOS: 2 days   [facility] Office  [phone] Pager - Text Page per [email or web address]  If 7PM-7AM, please contact night-coverage [email or web address]

## 2014-07-08 ENCOUNTER — Encounter (HOSPITAL_COMMUNITY): Payer: Self-pay | Admitting: Internal Medicine

## 2014-07-08 ENCOUNTER — Inpatient Hospital Stay (HOSPITAL_COMMUNITY): Payer: Medicare Other

## 2014-07-08 DIAGNOSIS — N183 Chronic kidney disease, stage 3 (moderate): Secondary | ICD-10-CM

## 2014-07-08 DIAGNOSIS — C189 Malignant neoplasm of colon, unspecified: Secondary | ICD-10-CM | POA: Insufficient documentation

## 2014-07-08 DIAGNOSIS — K639 Disease of intestine, unspecified: Secondary | ICD-10-CM

## 2014-07-08 LAB — GLUCOSE, CAPILLARY
GLUCOSE-CAPILLARY: 118 mg/dL — AB (ref 70–99)
GLUCOSE-CAPILLARY: 164 mg/dL — AB (ref 70–99)
GLUCOSE-CAPILLARY: 94 mg/dL (ref 70–99)
Glucose-Capillary: 127 mg/dL — ABNORMAL HIGH (ref 70–99)
Glucose-Capillary: 189 mg/dL — ABNORMAL HIGH (ref 70–99)
Glucose-Capillary: 206 mg/dL — ABNORMAL HIGH (ref 70–99)
Glucose-Capillary: 236 mg/dL — ABNORMAL HIGH (ref 70–99)

## 2014-07-08 LAB — CBC
HCT: 21.4 % — ABNORMAL LOW (ref 36.0–46.0)
HEMOGLOBIN: 6.9 g/dL — AB (ref 12.0–15.0)
MCH: 28.6 pg (ref 26.0–34.0)
MCHC: 32.2 g/dL (ref 30.0–36.0)
MCV: 88.8 fL (ref 78.0–100.0)
PLATELETS: 279 10*3/uL (ref 150–400)
RBC: 2.41 MIL/uL — AB (ref 3.87–5.11)
RDW: 14.4 % (ref 11.5–15.5)
WBC: 6.1 10*3/uL (ref 4.0–10.5)

## 2014-07-08 LAB — BASIC METABOLIC PANEL
ANION GAP: 3 — AB (ref 5–15)
BUN: 24 mg/dL — ABNORMAL HIGH (ref 6–23)
CO2: 25 mmol/L (ref 19–32)
CREATININE: 1.78 mg/dL — AB (ref 0.50–1.10)
Calcium: 8.7 mg/dL (ref 8.4–10.5)
Chloride: 116 mmol/L — ABNORMAL HIGH (ref 96–112)
GFR calc non Af Amer: 27 mL/min — ABNORMAL LOW (ref >90)
GFR, EST AFRICAN AMERICAN: 31 mL/min — AB (ref >90)
Glucose, Bld: 120 mg/dL — ABNORMAL HIGH (ref 70–99)
POTASSIUM: 4.1 mmol/L (ref 3.5–5.1)
Sodium: 144 mmol/L (ref 135–145)

## 2014-07-08 MED ORDER — CHLORHEXIDINE GLUCONATE CLOTH 2 % EX PADS
6.0000 | MEDICATED_PAD | Freq: Once | CUTANEOUS | Status: AC
  Administered 2014-07-08: 6 via TOPICAL

## 2014-07-08 MED ORDER — IOHEXOL 300 MG/ML  SOLN
25.0000 mL | INTRAMUSCULAR | Status: AC
  Administered 2014-07-08 (×2): 25 mL via ORAL

## 2014-07-08 MED ORDER — DEXTROSE 5 % IV SOLN
2.0000 g | INTRAVENOUS | Status: AC
  Administered 2014-07-09: 2 g via INTRAVENOUS
  Filled 2014-07-08: qty 2

## 2014-07-08 MED ORDER — SODIUM CHLORIDE 0.9 % IV SOLN
500.0000 mg | Freq: Once | INTRAVENOUS | Status: AC
  Administered 2014-07-08: 500 mg via INTRAVENOUS
  Filled 2014-07-08 (×2): qty 10

## 2014-07-08 MED ORDER — SODIUM CHLORIDE 0.9 % IV SOLN
25.0000 mg | Freq: Once | INTRAVENOUS | Status: AC
  Administered 2014-07-08: 25 mg via INTRAVENOUS
  Filled 2014-07-08: qty 0.5

## 2014-07-08 MED ORDER — ALVIMOPAN 12 MG PO CAPS
12.0000 mg | ORAL_CAPSULE | Freq: Once | ORAL | Status: AC
  Administered 2014-07-09: 12 mg via ORAL
  Filled 2014-07-08: qty 1

## 2014-07-08 MED ORDER — CHLORHEXIDINE GLUCONATE CLOTH 2 % EX PADS
6.0000 | MEDICATED_PAD | Freq: Once | CUTANEOUS | Status: AC
  Administered 2014-07-09: 6 via TOPICAL

## 2014-07-08 NOTE — Progress Notes (Signed)
CRITICAL VALUE ALERT  Critical value received:  Hgb=6.9  Date of notification:  07/08/2014  Time of notification:  08:11  Critical value read back:Yes.    Nurse who received alert:  Alphonsus Sias  MD notified (1st page):  Dr. Wynelle Cleveland  Time of first page:  08:13  MD notified (2nd page):  Time of second page:  Responding MD:  Dr. Wynelle Cleveland  Time MD responded:  08:20

## 2014-07-08 NOTE — Progress Notes (Signed)
PT Cancellation Note  Patient Details Name: Teresa Flowers MRN: [id number] DOB: 02-09-1941   Cancelled Treatment:    Reason Eval/Treat Not Completed: Patient declined, no reason specified.  I've been up a lot already and I just want to read this afternoon. 07/08/2014  Donnella Sham, [place] [phone]  (pager)  [name], [name] 07/08/2014, 4:01 PM

## 2014-07-08 NOTE — Progress Notes (Signed)
Patient ID: Teresa Flowers, female   DOB: 1940/11/16, 74 y.o.   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  The rest of the polyps were benign. I recommend proceeding with a laparoscopic assisted partial colectomy.  I discussed this with her in detail.  I discussed the risks which include but are not limited to bleeding, infection, injury to surrounding structures, anastomotic leak, need for further surgery, cardiopulmonary issues, DVT, etc.  She agrees to proceed.  Plan for surgery tomorrow.

## 2014-07-08 NOTE — Progress Notes (Addendum)
[facility] Progress Note   Teresa Flowers VQM:[id number] DOB: 11-14-1940 DOA: 07/05/2014 PCP: Wallene Dales, MD  Brief narrative: Teresa Flowers is a 74 y.o. 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   Subjective: No complaints.   Assessment/Plan: Principal Problem:   GI bleed/ melena/ cecal mass and 7 polyps  - EGD unrevealing- Colonoscopy shows a 4 cm mass in right colon- GI has done biopsies and are recommending a gen surgery consult - Gen surgery plans on taking to OR on Thurs- awaiting biopsy results from the 7 polyps to determine how extensive of a colectomy will be required. Right hemicolectomy will be done at the minimum.  - no further bleeding thus far  Active Problems:   Acute blood loss anemia - declining blood as she is a Jehovah's wittness - Hb ranging from high 6 to 8 - cont to follow- she was symptomatic on admission and continues to be asymptomatic - Iron levels normal but low normal- will give IV Iron today to boost stores    Essential hypertension - Norvasc    Hypothyroid - Levothyroxine    Type 2 diabetes mellitus, uncontrolled, with renal complications - Lantus and siding scale insulin- sugars stable thus far    ARF on CKD Stage III chronic kidney disease - prerenal- Cr improved from admission (2.39) - close to baseline which is about 1.5-1.7  Appt with PCP: requested Code Status: full code Family Communication:  Disposition Plan: home when stable - with HHPT DVT prophylaxis: SCDs Consultants:GI Procedures:EGD pending  Antibiotics: Anti-infectives    None      Objective: Filed Weights   07/05/14 1138 07/05/14 1708  Weight: 102.059 kg (225 lb) 101.5 kg (223 lb 12.3 oz)    Intake/Output Summary (Last 24 hours) at 07/08/14 1101 Last data filed at  07/08/14 0850  Gross per 24 hour  Intake 1592.5 ml  Output    500 ml  Net 1092.5 ml     Vitals Filed Vitals:   07/07/14 1637 07/07/14 1705 07/07/14 2122 07/08/14 0520  BP: 135/60 161/53 135/60 155/64  Pulse:  81    Temp:  99.1 F (37.3 C) 98.6 F (37 C) 98.8 F (37.1 C)  TempSrc:  Oral Oral Oral  Resp: 18 16 17 15   Height:      Weight:      SpO2: 100% 100% 100% 99%    Exam:  General:  Teresa Flowers is alert, not in acute distress  HEENT: No icterus, No thrush  Cardiovascular: regular rate and rhythm, S1/S2 No murmur  Respiratory: clear to auscultation bilaterally   Abdomen: Soft, +Bowel sounds, non tender, non distended, no guarding  MSK: No LE edema, cyanosis or clubbing  Data Reviewed: Basic Metabolic Panel:  Recent Labs Lab 07/05/14 1152 07/06/14 0625 07/07/14 0744 07/08/14 0618  NA 139 143 144 144  K 4.3 3.4* 4.1 4.1  CL 108 112 117* 116*  CO2 21 24 20 25   GLUCOSE 283* 96 135* 120*  BUN 63* 54* 33* 24*  CREATININE 2.39* 1.87* 1.66* 1.78*  CALCIUM 9.0 8.7 9.0 8.7   Liver Function Tests:  Recent Labs Lab 07/05/14 1152  AST 18  ALT 12  ALKPHOS 77  BILITOT 0.3  PROT 6.7  ALBUMIN 3.3*   No results for input(s): LIPASE, AMYLASE in the last 168 hours. No  results for input(s): AMMONIA in the last 168 hours. CBC:  Recent Labs Lab 07/05/14 1940 07/06/14 0625 07/06/14 1639 07/07/14 0744 07/08/14 0618  WBC 11.1* 6.3 6.9 6.7 6.1  HGB 8.1* 6.9* 7.0* 7.7* 6.9*  HCT 24.1* 21.3* 22.1* 24.2* 21.4*  MCV 84.3 86.5 87.0 87.7 88.8  PLT 257 243 247 303 279   Cardiac Enzymes: No results for input(s): CKTOTAL, CKMB, CKMBINDEX, TROPONINI in the last 168 hours. BNP (last 3 results) No results for input(s): BNP in the last 8760 hours.  ProBNP (last 3 results) No results for input(s): PROBNP in the last 8760 hours.  CBG:  Recent Labs Lab 07/07/14 1714 07/07/14 2034 07/08/14 0030 07/08/14 0406 07/08/14 0832  GLUCAP 92 264* 236* 118* 127*    No  results found for this or any previous visit (from the past 240 hour(s)).   Studies:  Recent x-ray studies have been reviewed in detail by the Attending Physician  Scheduled Meds:  Scheduled Meds: . amLODipine  5 mg Oral Daily  . insulin aspart  0-9 Units Subcutaneous 6 times per day  . insulin glargine  10 Units Subcutaneous QHS  . iohexol  25 mL Oral Q1 Hr x 2  . iron dextran (INFED/DEXFERRUM) infusion  500 mg Intravenous Once  . levothyroxine  88 mcg Oral QAC breakfast  . sodium chloride  3 mL Intravenous Q12H   Continuous Infusions: . dextrose 5 % and 0.9% NaCl 75 mL/hr at 07/08/14 0554    Time spent on care of this patient: 35 min   [place], MD 07/08/2014, 11:01 AM  LOS: 3 days   [facility] Office  [phone] Pager - Text Page per [email or web address]  If 7PM-7AM, please contact night-coverage [email or web address]

## 2014-07-08 NOTE — Progress Notes (Signed)
    Progress Note   Subjective  feels fine. Getting IV iron.   Objective   Vital signs in last 24 hours: Temp:  [98.6 F (37 C)-99.1 F (37.3 C)] 98.8 F (37.1 C) (03/15 0520) Pulse Rate:  [60-82] 81 (03/14 1705) Resp:  [12-19] 15 (03/15 0520) BP: (90-206)/(28-68) 155/64 mmHg (03/15 0520) SpO2:  [89 %-100 %] 99 % (03/15 0520) Last BM Date: 07/07/14 General:    Pleasant black female in NAD Abdomen:  Soft, nontender and nondistended. Normal bowel sounds. Extremities:  Without edema. Neurologic:  Alert and oriented,  grossly normal neurologically. Psych:  Cooperative. Normal mood and affect.  Lab Results:  Recent Labs  07/06/14 1639 07/07/14 0744 07/08/14 0618  WBC 6.9 6.7 6.1  HGB 7.0* 7.7* 6.9*  HCT 22.1* 24.2* 21.4*  PLT 247 303 279   BMET  Recent Labs  07/06/14 0625 07/07/14 0744 07/08/14 0618  NA 143 144 144  K 3.4* 4.1 4.1  CL 112 117* 116*  CO2 24 20 25   GLUCOSE 96 135* 120*  BUN 54* 33* 24*  CREATININE 1.87* 1.66* 1.78*  CALCIUM 8.7 9.0 8.7   LFT  Recent Labs  07/05/14 1152  PROT 6.7  ALBUMIN 3.3*  AST 18  ALT 12  ALKPHOS 77  BILITOT 0.3     Assessment / Plan:    27. 74 year old female with GI bleed. EGD negative. Colonoscopy with polypectomy yesterday with findings of several polyps and a cecal mass. Surgery has evaluated and will get CTscan of chest/abd/pelvis to stage. Pathology pending. Follow up colonoscopy in a year  2. Severe anemia, hgb 6.9. She will not take blood for religious purposes. Getting IV iron right now.     LOS: 3 days   Tye Savoy  07/08/2014, 10:57 AM   GI ATTENDING  Interval history and data reviewed. Patient personally seen and examined. Agree with progress note as scribed above. I reviewed with her the pathology results as well as anticipated course of treatment. She is markedly anemic and receiving IV iron (Jehovah's Witness). Anticipating CT scan today for preoperative staging purposes. She will need  recall colonoscopy with Dr. Hilarie Fredrickson in 1 year ([place] preference [place]). Recall has been made. We are available as needed. Thank you  Docia Chuck. Geri Seminole., M.D. [facility] Division of Gastroenterology

## 2014-07-08 NOTE — Consult Note (Signed)
Teresa Flowers [birth year]  [id number].   Requesting MD: Dr. Scarlette Shorts Chief Complaint/Reason for Consult: cecal mass HPI: This is a 74 yo obese black female who is a JEHOVAH'S WITNESS and does not receive blood product.  She has a h/o HTN, DM, and CKD.  She noticed on Friday that she had some BRBPR when she had a BM and wiped.  She presented to [facility] for further work up where she was noted to have a hgb of 8.  She was admitted for further work up.  GI Was consulted.  A colonoscopy was performed yesterday.  This revealed a cecal mass as well as 7 other polyps throughout her colon.  These were removed and biopsies were taken of the mass.  Pathology is pending.  The patient denies ever seeing blood per rectum before Friday.  She does have to take stool softeners several times a week to have 2 BMs on average a week.  She denies weight loss or abdominal pain.  She has otherwise been in her normal state of health.  ROS : Please see HPI, otherwise negative  Family History  Problem Relation Age of Onset  . Heart disease Mother   . Transient ischemic attack Brother   . Heart attack Daughter     Past Medical History  Diagnosis Date  . Essential hypertension   . Vertigo   . Hypothyroid   . High cholesterol   . Type 2 diabetes mellitus, uncontrolled, with renal complications   . Anemia in chronic kidney disease   . Stage III chronic kidney disease   . Cerebrovascular disease     prior infarcts seen on head CT per patient  . Obesity   . Hypertensive urgency 12/16/2013  . Refusal of blood product   . TIA (transient ischemic attack)   . GERD (gastroesophageal reflux disease)   . Intermediate coronary syndrome     Past Surgical History  Procedure Laterality Date  . Cardiovascular stress test  10/29/13    mildly abnoraml adenosine myoview EF 53%  . Colonoscopy    . Cyst excision    . Left heart catheterization with coronary angiogram N/A 12/16/2013    Procedure: LEFT HEART CATHETERIZATION  WITH CORONARY ANGIOGRAM;  Surgeon: Jettie Booze, MD;  Location: [facility] CATH LAB;  Service: Cardiovascular;  Laterality: N/A;  . Esophagogastroduodenoscopy N/A 07/06/2014    Procedure: ESOPHAGOGASTRODUODENOSCOPY (EGD);  Surgeon: Jerene Bears, MD;  Location: [facility] ENDOSCOPY;  Service: Endoscopy;  Laterality: N/A;  . Colonoscopy N/A 07/07/2014    Procedure: COLONOSCOPY;  Surgeon: Irene Shipper, MD;  Location: [place];  Service: Endoscopy;  Laterality: N/A;  MAC also okay    Social History:  reports that she quit smoking about 21 years ago. She has never used smokeless tobacco. She reports that she does not drink alcohol or use illicit drugs.  Allergies: No Known Allergies  Medications Prior to Admission  Medication Sig Dispense Refill  . amLODipine (NORVASC) 5 MG tablet Take 5 mg by mouth daily.    . insulin glargine (LANTUS) 100 UNIT/ML injection Inject 0.2 mLs (20 Units total) into the skin at bedtime. (Patient taking differently: Inject 20 Units into the skin at bedtime. <200 pt does not take) 10 mL 11  . insulin lispro (HUMALOG) 100 UNIT/ML injection Inject 2-6 Units into the skin 3 (three) times daily before meals.     Marland Kitchen levothyroxine (SYNTHROID, LEVOTHROID) 88 MCG tablet Take 88 mcg by mouth daily before breakfast.    .  lisinopril (PRINIVIL,ZESTRIL) 20 MG tablet Take 20 mg by mouth daily.    . rosuvastatin (CRESTOR) 10 MG tablet Take 10 mg by mouth at bedtime.     . B-D ULTRAFINE III SHORT PEN 31G X 8 MM MISC   3  . meclizine (ANTIVERT) 25 MG tablet Take 25 mg by mouth 3 (three) times daily as needed for dizziness.    Glory Rosebush VERIO test strip   6  . Vitamin D, Ergocalciferol, (DRISDOL) 50000 UNITS CAPS capsule Take 50,000 Units by mouth once a week.  0    Blood pressure 155/64, pulse 81, temperature 98.8 F (37.1 C), temperature source Oral, resp. rate 15, height '5\' 5"'  (1.651 m), weight 101.5 kg (223 lb 12.3 oz), SpO2 99 %. Physical Exam: General: pleasant, obese black female who  is laying in bed in NAD HEENT: head is normocephalic, atraumatic.  Sclera are noninjected.  PERRL.  Ears and nose without any masses or lesions.  Mouth is pink and moist Heart: regular, rate, and rhythm.  Normal s1,s2. No obvious murmurs, gallops, or rubs noted.  Palpable radial and pedal pulses bilaterally Lungs: CTAB, no wheezes, rhonchi, or rales noted.  Respiratory effort nonlabored Abd: soft, NT, ND, +BS, no masses, hernias, or organomegaly MS: all 4 extremities are symmetrical with no cyanosis, clubbing, or edema. Skin: warm and dry with no masses, lesions, or rashes Psych: A&Ox3 with an appropriate affect.    Results for orders placed or performed during the hospital encounter of 07/05/14 (from the past 48 hour(s))  Glucose, capillary     Status: Abnormal   Collection Time: 07/06/14 11:50 AM  Result Value Ref Range   Glucose-Capillary 178 (H) 70 - 99 mg/dL  Glucose, capillary     Status: Abnormal   Collection Time: 07/06/14  4:16 PM  Result Value Ref Range   Glucose-Capillary 355 (H) 70 - 99 mg/dL  CBC     Status: Abnormal   Collection Time: 07/06/14  4:39 PM  Result Value Ref Range   WBC 6.9 4.0 - 10.5 K/uL   RBC 2.54 (L) 3.87 - 5.11 MIL/uL   Hemoglobin 7.0 (L) 12.0 - 15.0 g/dL   HCT 22.1 (L) 36.0 - 46.0 %   MCV 87.0 78.0 - 100.0 fL   MCH 27.6 26.0 - 34.0 pg   MCHC 31.7 30.0 - 36.0 g/dL   RDW 13.9 11.5 - 15.5 %   Platelets 247 150 - 400 K/uL  Vitamin B12     Status: Abnormal   Collection Time: 07/06/14  4:39 PM  Result Value Ref Range   Vitamin B-12 934 (H) 211 - 911 pg/mL    Comment: Performed at [facility]  Folate     Status: None   Collection Time: 07/06/14  4:39 PM  Result Value Ref Range   Folate 17.4 ng/mL    Comment: (NOTE) Reference Ranges        Deficient:       0.4 - 3.3 ng/mL        Indeterminate:   3.4 - 5.4 ng/mL        Normal:              > 5.4 ng/mL Performed at [facility]   Iron and TIBC     Status: Abnormal   Collection  Time: 07/06/14  4:39 PM  Result Value Ref Range   Iron 41 (L) 42 - 145 ug/dL   TIBC 211 (L) 250 - 470 ug/dL  Saturation Ratios 19 (L) 20 - 55 %   UIBC 170 125 - 400 ug/dL    Comment: Performed at [facility]  Ferritin     Status: None   Collection Time: 07/06/14  4:39 PM  Result Value Ref Range   Ferritin 38 10 - 291 ng/mL    Comment: Performed at [facility]  Reticulocytes     Status: Abnormal   Collection Time: 07/06/14  4:39 PM  Result Value Ref Range   Retic Ct Pct 2.1 0.4 - 3.1 %   RBC. 2.54 (L) 3.87 - 5.11 MIL/uL   Retic Count, Manual 53.3 19.0 - 186.0 K/uL  Glucose, capillary     Status: Abnormal   Collection Time: 07/06/14  8:17 PM  Result Value Ref Range   Glucose-Capillary 168 (H) 70 - 99 mg/dL   Comment 1 Notify RN   Glucose, capillary     Status: Abnormal   Collection Time: 07/06/14 10:02 PM  Result Value Ref Range   Glucose-Capillary 169 (H) 70 - 99 mg/dL  Glucose, capillary     Status: Abnormal   Collection Time: 07/07/14 12:11 AM  Result Value Ref Range   Glucose-Capillary 177 (H) 70 - 99 mg/dL   Comment 1 QC Due    Comment 2 Notify RN   Glucose, capillary     Status: Abnormal   Collection Time: 07/07/14  5:18 AM  Result Value Ref Range   Glucose-Capillary 138 (H) 70 - 99 mg/dL   Comment 1 Notify RN   CBC     Status: Abnormal   Collection Time: 07/07/14  7:44 AM  Result Value Ref Range   WBC 6.7 4.0 - 10.5 K/uL   RBC 2.76 (L) 3.87 - 5.11 MIL/uL   Hemoglobin 7.7 (L) 12.0 - 15.0 g/dL   HCT 24.2 (L) 36.0 - 46.0 %   MCV 87.7 78.0 - 100.0 fL   MCH 27.9 26.0 - 34.0 pg   MCHC 31.8 30.0 - 36.0 g/dL   RDW 14.2 11.5 - 15.5 %   Platelets 303 150 - 400 K/uL  Basic metabolic panel     Status: Abnormal   Collection Time: 07/07/14  7:44 AM  Result Value Ref Range   Sodium 144 135 - 145 mmol/L   Potassium 4.1 3.5 - 5.1 mmol/L   Chloride 117 (H) 96 - 112 mmol/L   CO2 20 19 - 32 mmol/L   Glucose, Bld 135 (H) 70 - 99 mg/dL   BUN 33 (H) 6 - 23  mg/dL   Creatinine, Ser 1.66 (H) 0.50 - 1.10 mg/dL   Calcium 9.0 8.4 - 10.5 mg/dL   GFR calc non Af Amer 29 (L) >90 mL/min   GFR calc Af Amer 34 (L) >90 mL/min    Comment: (NOTE) The eGFR has been calculated using the CKD EPI equation. This calculation has not been validated in all clinical situations. eGFR's persistently <90 mL/min signify possible Chronic Kidney Disease.    Anion gap 7 5 - 15  Glucose, capillary     Status: Abnormal   Collection Time: 07/07/14  8:17 AM  Result Value Ref Range   Glucose-Capillary 140 (H) 70 - 99 mg/dL  Glucose, capillary     Status: Abnormal   Collection Time: 07/07/14 12:09 PM  Result Value Ref Range   Glucose-Capillary 138 (H) 70 - 99 mg/dL  Glucose, capillary     Status: None   Collection Time: 07/07/14  5:14 PM  Result Value Ref Range  Glucose-Capillary 92 70 - 99 mg/dL  Glucose, capillary     Status: Abnormal   Collection Time: 07/07/14  8:34 PM  Result Value Ref Range   Glucose-Capillary 264 (H) 70 - 99 mg/dL   Comment 1 Notify RN   Glucose, capillary     Status: Abnormal   Collection Time: 07/08/14 12:30 AM  Result Value Ref Range   Glucose-Capillary 236 (H) 70 - 99 mg/dL   Comment 1 Notify RN   Glucose, capillary     Status: Abnormal   Collection Time: 07/08/14  4:06 AM  Result Value Ref Range   Glucose-Capillary 118 (H) 70 - 99 mg/dL   Comment 1 Notify RN   Basic metabolic panel     Status: Abnormal   Collection Time: 07/08/14  6:18 AM  Result Value Ref Range   Sodium 144 135 - 145 mmol/L   Potassium 4.1 3.5 - 5.1 mmol/L   Chloride 116 (H) 96 - 112 mmol/L   CO2 25 19 - 32 mmol/L   Glucose, Bld 120 (H) 70 - 99 mg/dL   BUN 24 (H) 6 - 23 mg/dL   Creatinine, Ser 1.78 (H) 0.50 - 1.10 mg/dL   Calcium 8.7 8.4 - 10.5 mg/dL   GFR calc non Af Amer 27 (L) >90 mL/min   GFR calc Af Amer 31 (L) >90 mL/min    Comment: (NOTE) The eGFR has been calculated using the CKD EPI equation. This calculation has not been validated in all  clinical situations. eGFR's persistently <90 mL/min signify possible Chronic Kidney Disease.    Anion gap 3 (L) 5 - 15  CBC     Status: Abnormal   Collection Time: 07/08/14  6:18 AM  Result Value Ref Range   WBC 6.1 4.0 - 10.5 K/uL   RBC 2.41 (L) 3.87 - 5.11 MIL/uL   Hemoglobin 6.9 (LL) 12.0 - 15.0 g/dL    Comment: REPEATED TO VERIFY CRITICAL RESULT CALLED TO, READ BACK BY AND VERIFIED WITH: G.STALCO,RN [id number] 07/08/14 CLARK,S    HCT 21.4 (L) 36.0 - 46.0 %   MCV 88.8 78.0 - 100.0 fL   MCH 28.6 26.0 - 34.0 pg   MCHC 32.2 30.0 - 36.0 g/dL   RDW 14.4 11.5 - 15.5 %   Platelets 279 150 - 400 K/uL  Glucose, capillary     Status: Abnormal   Collection Time: 07/08/14  8:32 AM  Result Value Ref Range   Glucose-Capillary 127 (H) 70 - 99 mg/dL   No results found.     Assessment/Plan 1. Cecal mass, likely malignant 2. Multiple polyps of colon 3. ABL anemia 4. HTN 5. DM 6. CKD, stage 3  Plan: 1. The patient will need a CT C/A/P for staging purposes to rule out metastatic disease. 2. Await pathology from polyps to make sure there are no surprise malignancies identified prior to proceeding with surgery. 3. Cont on clear liquids to avoid a reprep 4. Hgb is 6.9.  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  ? Some IV Fe to help assist in increase in hgb.  Obviously, this is quite low to think about going into a fairly major operation without the back up of a blood transfusion.  Zayaan Kozak E 07/08/2014, 9:04 AM Pager: [phone]

## 2014-07-09 ENCOUNTER — Inpatient Hospital Stay (HOSPITAL_COMMUNITY): Payer: Medicare Other | Admitting: Anesthesiology

## 2014-07-09 ENCOUNTER — Encounter (HOSPITAL_COMMUNITY)
Admission: EM | Disposition: A | Discharge status: Home Health | Payer: Self-pay | Source: Home / Self Care | Attending: Internal Medicine

## 2014-07-09 ENCOUNTER — Encounter (HOSPITAL_COMMUNITY): Payer: Self-pay | Admitting: Anesthesiology

## 2014-07-09 DIAGNOSIS — R918 Other nonspecific abnormal finding of lung field: Secondary | ICD-10-CM

## 2014-07-09 DIAGNOSIS — R404 Transient alteration of awareness: Secondary | ICD-10-CM

## 2014-07-09 HISTORY — PX: LAPAROSCOPIC PARTIAL COLECTOMY: SHX5907

## 2014-07-09 LAB — POCT I-STAT 7, (LYTES, BLD GAS, ICA,H+H)
Acid-base deficit: 1 mmol/L (ref 0.0–2.0)
BICARBONATE: 23.1 meq/L (ref 20.0–24.0)
CALCIUM ION: 1.27 mmol/L (ref 1.13–1.30)
HEMATOCRIT: 16 % — AB (ref 36.0–46.0)
Hemoglobin: 5.4 g/dL — CL (ref 12.0–15.0)
O2 SAT: 100 %
PH ART: 7.416 (ref 7.350–7.450)
PO2 ART: 358 mmHg — AB (ref 80.0–100.0)
Patient temperature: 36.6
Potassium: 3.7 mmol/L (ref 3.5–5.1)
Sodium: 142 mmol/L (ref 135–145)
TCO2: 24 mmol/L (ref 0–100)
pCO2 arterial: 35.8 mmHg (ref 35.0–45.0)

## 2014-07-09 LAB — GLUCOSE, CAPILLARY
GLUCOSE-CAPILLARY: 91 mg/dL (ref 70–99)
Glucose-Capillary: 59 mg/dL — ABNORMAL LOW (ref 70–99)
Glucose-Capillary: 112 mg/dL — ABNORMAL HIGH (ref 70–99)
Glucose-Capillary: 105 mg/dL — ABNORMAL HIGH (ref 70–99)
Glucose-Capillary: 130 mg/dL — ABNORMAL HIGH (ref 70–99)
Glucose-Capillary: 237 mg/dL — ABNORMAL HIGH (ref 70–99)

## 2014-07-09 LAB — BASIC METABOLIC PANEL
Anion gap: 3 — ABNORMAL LOW (ref 5–15)
BUN: 13 mg/dL (ref 6–23)
CHLORIDE: 117 mmol/L — AB (ref 96–112)
CO2: 24 mmol/L (ref 19–32)
CREATININE: 1.48 mg/dL — AB (ref 0.50–1.10)
Calcium: 8.8 mg/dL (ref 8.4–10.5)
GFR calc Af Amer: 39 mL/min — ABNORMAL LOW (ref >90)
GFR, EST NON AFRICAN AMERICAN: 34 mL/min — AB (ref >90)
Glucose, Bld: 99 mg/dL (ref 70–99)
Potassium: 3.9 mmol/L (ref 3.5–5.1)
Sodium: 144 mmol/L (ref 135–145)

## 2014-07-09 LAB — CBC
HCT: 21.8 % — ABNORMAL LOW (ref 36.0–46.0)
HEMOGLOBIN: 7 g/dL — AB (ref 12.0–15.0)
MCH: 28.6 pg (ref 26.0–34.0)
MCHC: 32.1 g/dL (ref 30.0–36.0)
MCV: 89 fL (ref 78.0–100.0)
Platelets: 308 10*3/uL (ref 150–400)
RBC: 2.45 MIL/uL — ABNORMAL LOW (ref 3.87–5.11)
RDW: 14.4 % (ref 11.5–15.5)
WBC: 12.1 10*3/uL — ABNORMAL HIGH (ref 4.0–10.5)

## 2014-07-09 LAB — APTT: aPTT: 27 seconds (ref 24–37)

## 2014-07-09 LAB — SURGICAL PCR SCREEN
MRSA, PCR: NEGATIVE
Staphylococcus aureus: NEGATIVE

## 2014-07-09 LAB — PROTIME-INR
INR: 1.19 (ref 0.00–1.49)
Prothrombin Time: 15.3 seconds — ABNORMAL HIGH (ref 11.6–15.2)

## 2014-07-09 SURGERY — LAPAROSCOPIC PARTIAL COLECTOMY
Anesthesia: General | Site: Abdomen

## 2014-07-09 MED ORDER — HYDRALAZINE HCL 20 MG/ML IJ SOLN
10.0000 mg | INTRAMUSCULAR | Status: DC | PRN
  Administered 2014-07-11 – 2014-07-12 (×3): 10 mg via INTRAVENOUS
  Administered 2014-07-12 – 2014-07-14 (×3): 20 mg via INTRAVENOUS
  Filled 2014-07-09 (×7): qty 1

## 2014-07-09 MED ORDER — MORPHINE SULFATE (PF) 1 MG/ML IV SOLN
INTRAVENOUS | Status: DC
Start: 2014-07-09 — End: 2014-07-11
  Administered 2014-07-09: 1 mg via INTRAVENOUS
  Administered 2014-07-09: 11:00:00 via INTRAVENOUS
  Administered 2014-07-09: 3 mg via INTRAVENOUS
  Administered 2014-07-11 (×3): 0 mg via INTRAVENOUS

## 2014-07-09 MED ORDER — HYDROMORPHONE HCL 1 MG/ML IJ SOLN: 0.2500 mg | INTRAMUSCULAR | Status: DC | PRN

## 2014-07-09 MED ORDER — NEOSTIGMINE METHYLSULFATE 10 MG/10ML IV SOLN
INTRAVENOUS | Status: DC | PRN
  Administered 2014-07-09: 4 mg via INTRAVENOUS

## 2014-07-09 MED ORDER — ETOMIDATE 2 MG/ML IV SOLN
INTRAVENOUS | Status: DC | PRN
  Administered 2014-07-09: 20 mg via INTRAVENOUS

## 2014-07-09 MED ORDER — DEXTROSE 50 % IV SOLN
25.0000 mL | Freq: Once | INTRAVENOUS | Status: AC
  Administered 2014-07-09: 25 mL via INTRAVENOUS

## 2014-07-09 MED ORDER — LABETALOL HCL 5 MG/ML IV SOLN
INTRAVENOUS | Status: AC
  Filled 2014-07-09: qty 4

## 2014-07-09 MED ORDER — DIPHENHYDRAMINE HCL 12.5 MG/5ML PO ELIX
12.5000 mg | ORAL_SOLUTION | Freq: Four times a day (QID) | ORAL | Status: DC | PRN
  Filled 2014-07-09: qty 5

## 2014-07-09 MED ORDER — FENTANYL CITRATE 0.05 MG/ML IJ SOLN
INTRAMUSCULAR | Status: AC
  Filled 2014-07-09: qty 5

## 2014-07-09 MED ORDER — LEVOTHYROXINE SODIUM 100 MCG IV SOLR
44.0000 ug | Freq: Every day | INTRAVENOUS | Status: DC
  Administered 2014-07-09 – 2014-07-14 (×6): 44 ug via INTRAVENOUS
  Filled 2014-07-09 (×6): qty 5

## 2014-07-09 MED ORDER — PROPOFOL 10 MG/ML IV BOLUS
INTRAVENOUS | Status: DC | PRN
  Administered 2014-07-09: 30 mg via INTRAVENOUS

## 2014-07-09 MED ORDER — MIDAZOLAM HCL 5 MG/5ML IJ SOLN
INTRAMUSCULAR | Status: DC | PRN
  Administered 2014-07-09: 0.5 mg via INTRAVENOUS

## 2014-07-09 MED ORDER — HYDRALAZINE HCL 20 MG/ML IJ SOLN
INTRAMUSCULAR | Status: AC
  Administered 2014-07-09: 5 mg
  Filled 2014-07-09: qty 1

## 2014-07-09 MED ORDER — LACTATED RINGERS IV SOLN
INTRAVENOUS | Status: DC | PRN
  Administered 2014-07-09 (×2): via INTRAVENOUS

## 2014-07-09 MED ORDER — INSULIN ASPART 100 UNIT/ML ~~LOC~~ SOLN
0.0000 [IU] | Freq: Three times a day (TID) | SUBCUTANEOUS | Status: DC
  Administered 2014-07-09: 3 [IU] via SUBCUTANEOUS

## 2014-07-09 MED ORDER — SODIUM CHLORIDE 0.9 % IJ SOLN
INTRAMUSCULAR | Status: AC
  Filled 2014-07-09: qty 10

## 2014-07-09 MED ORDER — DIPHENHYDRAMINE HCL 50 MG/ML IJ SOLN: 12.5000 mg | Freq: Four times a day (QID) | INTRAMUSCULAR | Status: DC | PRN

## 2014-07-09 MED ORDER — MIDAZOLAM HCL 2 MG/2ML IJ SOLN
INTRAMUSCULAR | Status: AC
  Filled 2014-07-09: qty 2

## 2014-07-09 MED ORDER — GLYCOPYRROLATE 0.2 MG/ML IJ SOLN
INTRAMUSCULAR | Status: DC | PRN
  Administered 2014-07-09: 0.6 mg via INTRAVENOUS

## 2014-07-09 MED ORDER — MORPHINE SULFATE (PF) 1 MG/ML IV SOLN
INTRAVENOUS | Status: AC
  Filled 2014-07-09: qty 25

## 2014-07-09 MED ORDER — SUCCINYLCHOLINE CHLORIDE 20 MG/ML IJ SOLN
INTRAMUSCULAR | Status: AC
  Filled 2014-07-09: qty 1

## 2014-07-09 MED ORDER — ROCURONIUM BROMIDE 50 MG/5ML IV SOLN
INTRAVENOUS | Status: AC
  Filled 2014-07-09: qty 1

## 2014-07-09 MED ORDER — BUPIVACAINE-EPINEPHRINE (PF) 0.25% -1:200000 IJ SOLN
INTRAMUSCULAR | Status: AC
  Filled 2014-07-09: qty 30

## 2014-07-09 MED ORDER — HYDRALAZINE HCL 20 MG/ML IJ SOLN
5.0000 mg | INTRAMUSCULAR | Status: DC | PRN
  Administered 2014-07-09: 5 mg via INTRAVENOUS

## 2014-07-09 MED ORDER — BUPIVACAINE-EPINEPHRINE 0.25% -1:200000 IJ SOLN
INTRAMUSCULAR | Status: DC | PRN
  Administered 2014-07-09: 20 mL

## 2014-07-09 MED ORDER — DEXTROSE 50 % IV SOLN
INTRAVENOUS | Status: AC
  Filled 2014-07-09: qty 50

## 2014-07-09 MED ORDER — NEOSTIGMINE METHYLSULFATE 10 MG/10ML IV SOLN
INTRAVENOUS | Status: AC
  Filled 2014-07-09: qty 1

## 2014-07-09 MED ORDER — GLYCOPYRROLATE 0.2 MG/ML IJ SOLN
INTRAMUSCULAR | Status: AC
  Filled 2014-07-09: qty 3

## 2014-07-09 MED ORDER — ONDANSETRON HCL 4 MG/2ML IJ SOLN
INTRAMUSCULAR | Status: AC
  Filled 2014-07-09: qty 2

## 2014-07-09 MED ORDER — PROPOFOL 10 MG/ML IV BOLUS
INTRAVENOUS | Status: AC
  Filled 2014-07-09: qty 20

## 2014-07-09 MED ORDER — PHENYLEPHRINE 40 MCG/ML (10ML) SYRINGE FOR IV PUSH (FOR BLOOD PRESSURE SUPPORT)
PREFILLED_SYRINGE | INTRAVENOUS | Status: AC
  Filled 2014-07-09: qty 10

## 2014-07-09 MED ORDER — LIDOCAINE HCL (CARDIAC) 20 MG/ML IV SOLN
INTRAVENOUS | Status: AC
  Filled 2014-07-09: qty 5

## 2014-07-09 MED ORDER — FENTANYL CITRATE 0.05 MG/ML IJ SOLN
INTRAMUSCULAR | Status: DC | PRN
  Administered 2014-07-09 (×5): 50 ug via INTRAVENOUS

## 2014-07-09 MED ORDER — EPHEDRINE SULFATE 50 MG/ML IJ SOLN
INTRAMUSCULAR | Status: AC
  Filled 2014-07-09: qty 1

## 2014-07-09 MED ORDER — PROMETHAZINE HCL 25 MG/ML IJ SOLN
INTRAMUSCULAR | Status: AC
  Filled 2014-07-09: qty 1

## 2014-07-09 MED ORDER — MEPERIDINE HCL 25 MG/ML IJ SOLN: 6.2500 mg | INTRAMUSCULAR | Status: DC | PRN

## 2014-07-09 MED ORDER — PROMETHAZINE HCL 25 MG/ML IJ SOLN
6.2500 mg | INTRAMUSCULAR | Status: DC | PRN
  Administered 2014-07-09: 6.25 mg via INTRAVENOUS

## 2014-07-09 MED ORDER — PHENYLEPHRINE HCL 10 MG/ML IJ SOLN
10.0000 mg | INTRAVENOUS | Status: DC | PRN
  Administered 2014-07-09: 20 ug/min via INTRAVENOUS

## 2014-07-09 MED ORDER — SODIUM CHLORIDE 0.9 % IJ SOLN: 9.0000 mL | INTRAMUSCULAR | Status: DC | PRN

## 2014-07-09 MED ORDER — NALOXONE HCL 0.4 MG/ML IJ SOLN: 0.4000 mg | INTRAMUSCULAR | Status: DC | PRN

## 2014-07-09 MED ORDER — ONDANSETRON HCL 4 MG/2ML IJ SOLN: 4.0000 mg | Freq: Four times a day (QID) | INTRAMUSCULAR | Status: DC | PRN

## 2014-07-09 MED ORDER — ROCURONIUM BROMIDE 100 MG/10ML IV SOLN
INTRAVENOUS | Status: DC | PRN
  Administered 2014-07-09: 5 mg via INTRAVENOUS
  Administered 2014-07-09: 40 mg via INTRAVENOUS
  Administered 2014-07-09: 5 mg via INTRAVENOUS

## 2014-07-09 MED ORDER — DEXAMETHASONE SODIUM PHOSPHATE 4 MG/ML IJ SOLN
INTRAMUSCULAR | Status: AC
  Filled 2014-07-09: qty 1

## 2014-07-09 MED ORDER — SODIUM CHLORIDE 0.9 % IJ SOLN
INTRAMUSCULAR | Status: AC
  Filled 2014-07-09: qty 9

## 2014-07-09 MED ORDER — SODIUM CHLORIDE 0.9 % IV BOLUS (SEPSIS): 500.0000 mL | Freq: Once | INTRAVENOUS | Status: DC

## 2014-07-09 MED ORDER — ARTIFICIAL TEARS OP OINT
TOPICAL_OINTMENT | OPHTHALMIC | Status: DC | PRN
  Administered 2014-07-09: 1 via OPHTHALMIC

## 2014-07-09 MED ORDER — LABETALOL HCL 5 MG/ML IV SOLN
INTRAVENOUS | Status: DC | PRN
  Administered 2014-07-09: 5 mg via INTRAVENOUS

## 2014-07-09 MED ORDER — DEXAMETHASONE SODIUM PHOSPHATE 4 MG/ML IJ SOLN
INTRAMUSCULAR | Status: DC | PRN
  Administered 2014-07-09: 4 mg via INTRAVENOUS

## 2014-07-09 MED ORDER — ONDANSETRON HCL 4 MG/2ML IJ SOLN
INTRAMUSCULAR | Status: DC | PRN
  Administered 2014-07-09: 4 mg via INTRAVENOUS

## 2014-07-09 MED ORDER — 0.9 % SODIUM CHLORIDE (POUR BTL) OPTIME
TOPICAL | Status: DC | PRN
  Administered 2014-07-09: 2000 mL

## 2014-07-09 MED ORDER — SODIUM CHLORIDE 0.9 % IR SOLN
Status: DC | PRN
  Administered 2014-07-09: 1000 mL

## 2014-07-09 MED ORDER — ETOMIDATE 2 MG/ML IV SOLN
INTRAVENOUS | Status: AC
  Filled 2014-07-09: qty 10

## 2014-07-09 SURGICAL SUPPLY — 73 items
APPLIER CLIP ROT 10 11.4 M/L (STAPLE)
APR CLP MED LRG 11.4X10 (STAPLE)
BLADE SURG ROTATE 9660 (MISCELLANEOUS) IMPLANT
CANISTER SUCTION 2500CC (MISCELLANEOUS) ×3 IMPLANT
CELLS DAT CNTRL 66122 CELL SVR (MISCELLANEOUS) ×1 IMPLANT
CHLORAPREP W/TINT 26ML (MISCELLANEOUS) ×3 IMPLANT
CLIP APPLIE ROT 10 11.4 M/L (STAPLE) IMPLANT
COVER MAYO STAND STRL (DRAPES) ×5 IMPLANT
COVER SURGICAL LIGHT HANDLE (MISCELLANEOUS) ×6 IMPLANT
DRAPE LAPAROSCOPIC ABDOMINAL (DRAPES) ×3 IMPLANT
DRAPE PROXIMA HALF (DRAPES) ×2 IMPLANT
DRAPE UTILITY XL STRL (DRAPES) ×15 IMPLANT
DRAPE WARM FLUID 44X44 (DRAPE) ×3 IMPLANT
DRSG OPSITE POSTOP 4X10 (GAUZE/BANDAGES/DRESSINGS) IMPLANT
DRSG OPSITE POSTOP 4X6 (GAUZE/BANDAGES/DRESSINGS) ×2 IMPLANT
DRSG OPSITE POSTOP 4X8 (GAUZE/BANDAGES/DRESSINGS) IMPLANT
ELECT BLADE 6.5 EXT (BLADE) ×3 IMPLANT
ELECT CAUTERY BLADE 6.4 (BLADE) ×6 IMPLANT
ELECT REM PT RETURN 9FT ADLT (ELECTROSURGICAL) ×3
ELECTRODE REM PT RTRN 9FT ADLT (ELECTROSURGICAL) ×1 IMPLANT
GEL ULTRASOUND 20GR AQUASONIC (MISCELLANEOUS) IMPLANT
GLOVE BIOGEL PI IND STRL 8 (GLOVE) IMPLANT
GLOVE BIOGEL PI INDICATOR 8 (GLOVE) ×2
GLOVE SURG SIGNA 7.5 PF LTX (GLOVE) ×8 IMPLANT
GOWN STRL REUS W/ TWL LRG LVL3 (GOWN DISPOSABLE) ×6 IMPLANT
GOWN STRL REUS W/ TWL XL LVL3 (GOWN DISPOSABLE) ×2 IMPLANT
GOWN STRL REUS W/TWL LRG LVL3 (GOWN DISPOSABLE) ×12
GOWN STRL REUS W/TWL XL LVL3 (GOWN DISPOSABLE) ×6
KIT BASIN OR (CUSTOM PROCEDURE TRAY) ×3 IMPLANT
LEGGING LITHOTOMY PAIR STRL (DRAPES) IMPLANT
LIGASURE IMPACT 36 18CM CVD LR (INSTRUMENTS) ×2 IMPLANT
LIQUID BAND (GAUZE/BANDAGES/DRESSINGS) ×2 IMPLANT
NS IRRIG 1000ML POUR BTL (IV SOLUTION) ×6 IMPLANT
PAD ARMBOARD 7.5X6 YLW CONV (MISCELLANEOUS) ×6 IMPLANT
PENCIL BUTTON HOLSTER BLD 10FT (ELECTRODE) ×6 IMPLANT
RELOAD PROXIMATE 75MM BLUE (ENDOMECHANICALS) ×6 IMPLANT
RELOAD STAPLE 75 3.8 BLU REG (ENDOMECHANICALS) IMPLANT
RETRACTOR WND ALEXIS 18 MED (MISCELLANEOUS) IMPLANT
RTRCTR WOUND ALEXIS 18CM MED (MISCELLANEOUS) ×3
SCALPEL HARMONIC ACE (MISCELLANEOUS) ×3 IMPLANT
SCISSORS LAP 5X35 DISP (ENDOMECHANICALS) ×3 IMPLANT
SET IRRIG TUBING LAPAROSCOPIC (IRRIGATION / IRRIGATOR) ×2 IMPLANT
SLEEVE ENDOPATH XCEL 5M (ENDOMECHANICALS) ×3 IMPLANT
SPECIMEN JAR LARGE (MISCELLANEOUS) ×3 IMPLANT
SPONGE LAP 18X18 X RAY DECT (DISPOSABLE) ×2 IMPLANT
STAPLER GUN LINEAR PROX 60 (STAPLE) ×2 IMPLANT
STAPLER PROXIMATE 75MM BLUE (STAPLE) ×2 IMPLANT
STAPLER VISISTAT 35W (STAPLE) ×3 IMPLANT
SUCTION POOLE TIP (SUCTIONS) ×3 IMPLANT
SURGILUBE 2OZ TUBE FLIPTOP (MISCELLANEOUS) IMPLANT
SUT MNCRL AB 4-0 PS2 18 (SUTURE) ×4 IMPLANT
SUT PDS AB 1 TP1 96 (SUTURE) ×8 IMPLANT
SUT PROLENE 2 0 CT2 30 (SUTURE) IMPLANT
SUT PROLENE 2 0 KS (SUTURE) IMPLANT
SUT SILK 2 0 SH CR/8 (SUTURE) ×3 IMPLANT
SUT SILK 2 0 TIES 10X30 (SUTURE) ×3 IMPLANT
SUT SILK 3 0 SH CR/8 (SUTURE) ×3 IMPLANT
SUT SILK 3 0 TIES 10X30 (SUTURE) ×3 IMPLANT
SYR BULB IRRIGATION 50ML (SYRINGE) ×3 IMPLANT
SYS LAPSCP GELPORT 120MM (MISCELLANEOUS)
SYSTEM LAPSCP GELPORT 120MM (MISCELLANEOUS) IMPLANT
TOWEL OR 17X26 10 PK STRL BLUE (TOWEL DISPOSABLE) ×6 IMPLANT
TRAY FOLEY CATH 16FRSI W/METER (SET/KITS/TRAYS/PACK) ×3 IMPLANT
TRAY LAPAROSCOPIC (CUSTOM PROCEDURE TRAY) ×3 IMPLANT
TROCAR XCEL 12X100 BLDLESS (ENDOMECHANICALS) ×2 IMPLANT
TROCAR XCEL BLUNT TIP 100MML (ENDOMECHANICALS) IMPLANT
TROCAR XCEL NON-BLD 11X100MML (ENDOMECHANICALS) IMPLANT
TROCAR XCEL NON-BLD 5MMX100MML (ENDOMECHANICALS) ×3 IMPLANT
TUBE CONNECTING 12'X1/4 (SUCTIONS) ×2
TUBE CONNECTING 12X1/4 (SUCTIONS) ×4 IMPLANT
TUBING FILTER THERMOFLATOR (ELECTROSURGICAL) ×1 IMPLANT
TUBING INSUFFLATION (TUBING) ×3 IMPLANT
YANKAUER SUCT BULB TIP NO VENT (SUCTIONS) ×6 IMPLANT

## 2014-07-09 NOTE — Progress Notes (Signed)
Hypoglycemic Event  CBG: 59  Treatment: D50 IV 25 mL  Symptoms: Hungry  Follow-up CBG: [id number] CBG Result:112  Possible Reasons for Event: Inadequate meal intake  Comments/MD notified:MD on call OSMAN     Korby Ratay Sison  Remember to initiate Hypoglycemia Order Set & complete

## 2014-07-09 NOTE — Progress Notes (Addendum)
[facility] Progress Note   Teresa Flowers IRJ:[id number] DOB: [birth year] DOA: 07/05/2014 PCP: Wallene Dales, MD  Brief narrative: Teresa Flowers is a 74 y.o. 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   Subjective: Evaluated in PACU. Restless. C/o being cold- otherwise not very communicative. Have discussed code status with her - she wants CPR/ Shocks/ Meds but not intubation. Code status changed in [place].    Assessment/Plan: Principal Problem:   GI bleed/ melena/ cecal mass and 7 polyps  - EGD unrevealing- Colonoscopy shows a 4 cm mass in right colon- GI has done biopsies and are recommending a gen surgery consult  - s/p colectomy  - has had a bloody stool in the PACU- upon talking to the PACU RN, she had a bloody stool last night- I see no record of this in EPIC- transfer to SDU for now   Active Problems:   Acute blood loss anemia - declining blood as she is a Jehov[employer]'s wittness - Iron levels normal but low normal- Given IV Dextran 500 cc to boost stores on 3/16 - Hb 5.4 (POC marker) prior to surgery today and now 7 ? on CBC- cont to follow - check PT/ INR/ PTT to ensure she has adequate clotting factors    Essential hypertension - Norvasc    Hypothyroid - Levothyroxine    Type 2 diabetes mellitus, uncontrolled, with renal complications - Lantus and siding scale insulin- became hypoglycemic yesterday- will d/c Lantus and follow on ISS low dose for now    ARF on CKD Stage III chronic kidney disease - prerenal- Cr improved from admission (2.39) - now improved from baseline which is about 1.5-1.7  Appt with PCP: requested Code Status: full code Family Communication: [place] husband today Disposition Plan: home when stable - with HHPT DVT prophylaxis:  SCDs Consultants:GI Procedures:EGD pending  Antibiotics: Anti-infectives    Start     Dose/Rate Route Frequency Ordered Stop   07/09/14 0600  cefoTEtan (CEFOTAN) 2 g in dextrose 5 % 50 mL IVPB     2 g 100 mL/hr over 30 Minutes Intravenous On call to O.R. 07/08/14 1533 07/09/14 0850      Objective: Filed Weights   07/05/14 1138 07/05/14 1708  Weight: 102.059 kg (225 lb) 101.5 kg (223 lb 12.3 oz)    Intake/Output Summary (Last 24 hours) at 07/09/14 1416 Last data filed at 07/09/14 1045  Gross per 24 hour  Intake 3527.5 ml  Output   2775 ml  Net  752.5 ml     Vitals Filed Vitals:   07/09/14 1345 07/09/14 1351 07/09/14 1400 07/09/14 1415  BP:  177/63 169/60   Pulse: 78  65   Temp:    98.4 F (36.9 C)  TempSrc:      Resp: 14  13   Height:      Weight:      SpO2: 100%  100%     Exam:  General:  Pt is alert, not in acute distress  HEENT: No icterus, No thrush  Cardiovascular: regular rate and rhythm, S1/S2 No murmur  Respiratory: clear to auscultation bilaterally   Abdomen: Soft, +Bowel sounds, non tender, non distended, no guarding  MSK: No LE edema, cyanosis or clubbing  Data Reviewed: Basic Metabolic Panel:  Recent Labs Lab 07/05/14 1152 07/06/14 0625 07/07/14 0744 07/08/14 3016  07/09/14 0528 07/09/14 0905  NA 139 143 144 144 144 142  K 4.3 3.4* 4.1 4.1 3.9 3.7  CL 108 112 117* 116* 117*  --   CO2 21 24 20 25 24   --   GLUCOSE 283* 96 135* 120* 99  --   BUN 63* 54* 33* 24* 13  --   CREATININE 2.39* 1.87* 1.66* 1.78* 1.48*  --   CALCIUM 9.0 8.7 9.0 8.7 8.8  --    Liver Function Tests:  Recent Labs Lab 07/05/14 1152  AST 18  ALT 12  ALKPHOS 77  BILITOT 0.3  PROT 6.7  ALBUMIN 3.3*   No results for input(s): LIPASE, AMYLASE in the last 168 hours. No results for input(s): AMMONIA in the last 168 hours. CBC:  Recent Labs Lab 07/06/14 0625 07/06/14 1639 07/07/14 0744 07/08/14 0618 07/09/14 0905 07/09/14 1315  WBC 6.3 6.9 6.7 6.1   --  12.1*  HGB 6.9* 7.0* 7.7* 6.9* 5.4* 7.0*  HCT 21.3* 22.1* 24.2* 21.4* 16.0* 21.8*  MCV 86.5 87.0 87.7 88.8  --  89.0  PLT 243 247 303 279  --  308   Cardiac Enzymes: No results for input(s): CKTOTAL, CKMB, CKMBINDEX, TROPONINI in the last 168 hours. BNP (last 3 results) No results for input(s): BNP in the last 8760 hours.  ProBNP (last 3 results) No results for input(s): PROBNP in the last 8760 hours.  CBG:  Recent Labs Lab 07/09/14 0410 07/09/14 0450 07/09/14 0646 07/09/14 0806 07/09/14 1116  GLUCAP 59* 112* 91 105* 130*    Recent Results (from the past 240 hour(s))  Surgical pcr screen     Status: None   Collection Time: 07/08/14  8:16 PM  Result Value Ref Range Status   MRSA, PCR NEGATIVE NEGATIVE Final   Staphylococcus aureus NEGATIVE NEGATIVE Final    Comment:        The Xpert SA Assay (FDA approved for NASAL specimens in patients over 11 years of age), is one component of a comprehensive surveillance program.  Test performance has been validated by [facility] for patients greater than or equal to 29 year old. It is not intended to diagnose infection nor to guide or monitor treatment.      Studies:  Recent x-ray studies have been reviewed in detail by the Attending Physician  Scheduled Meds:  Scheduled Meds: . [MAR Hold] amLODipine  5 mg Oral Daily  . [MAR Hold] insulin aspart  0-9 Units Subcutaneous 6 times per day  . [MAR Hold] insulin glargine  10 Units Subcutaneous QHS  . [MAR Hold] levothyroxine  88 mcg Oral QAC breakfast  . [MAR Hold] morphine   Intravenous 6 times per day  . morphine      . promethazine      . sodium chloride  500 mL Intravenous Once  . [MAR Hold] sodium chloride  3 mL Intravenous Q12H  . sodium chloride       Continuous Infusions: . dextrose 5 % and 0.9% NaCl 75 mL/hr at 07/09/14 0620    Time spent on care of this patient: 35 min   [place], MD 07/09/2014, 2:16 PM  LOS: 4 days   [facility] Office  [phone] Pager - Text Page per [email or web address]  If 7PM-7AM, please contact night-coverage [email or web address]

## 2014-07-09 NOTE — Anesthesia Procedure Notes (Signed)
Procedure Name: Intubation Date/Time: 07/09/2014 8:42 AM Performed by: Susa Loffler Pre-anesthesia Checklist: Patient identified, Timeout performed, Emergency Drugs available, Suction available and Patient being monitored Patient Re-evaluated:Patient Re-evaluated prior to inductionOxygen Delivery Method: Circle system utilized Preoxygenation: Pre-oxygenation with 100% oxygen Intubation Type: IV induction Ventilation: Mask ventilation without difficulty and Oral airway inserted - appropriate to patient size Laryngoscope Size: Mac and 3 Grade View: Grade I Tube type: Oral Tube size: 7.0 mm Number of attempts: 1 Airway Equipment and Method: Stylet and Oral airway Placement Confirmation: ETT inserted through vocal cords under direct vision,  positive ETCO2 and breath sounds checked- equal and bilateral Secured at: 21 cm Tube secured with: Tape Dental Injury: Teeth and Oropharynx as per pre-operative assessment

## 2014-07-09 NOTE — Progress Notes (Signed)
Patient Hgb: 5.4 and Systolic BP >300. Dr. Wynelle Cleveland notified and patient to be sent from PACU to [facility] bed.  Belongings placed on side to be sent to patient's new room. Awaiting assignment of bed and will give report to Stepdown.

## 2014-07-09 NOTE — Anesthesia Preprocedure Evaluation (Addendum)
Anesthesia Evaluation  Patient identified by MRN, date of birth, ID band Patient awake    Reviewed: Allergy & Precautions, NPO status , Patient's Chart, lab work & pertinent test results  Airway Mallampati: II  TM Distance: >3 FB Neck ROM: Full    Dental no notable dental hx.    Pulmonary neg pulmonary ROS, former smoker,  breath sounds clear to auscultation  Pulmonary exam normal       Cardiovascular hypertension, Pt. on medications + angina Rhythm:Regular Rate:Normal  Echo - Left ventricle: Basal septal hypertrophy with sigmoid shapedseptum. The cavity size was normal. Systolic function was normal.The estimated ejection fraction was in the range of 55% to 60%.Wall motion was normal; there were no regional wall motionabnormalities. - Mitral valve: There was mild regurgitation. - Atrial septum: No defect or patent foramen ovale was identified.    Neuro/Psych TIACVA negative psych ROS   GI/Hepatic negative GI ROS, Neg liver ROS, GERD-  ,  Endo/Other  diabetes, Type 2, Insulin DependentHypothyroidism   Renal/GU Renal disease  negative genitourinary   Musculoskeletal negative musculoskeletal ROS (+)   Abdominal (+) + obese,   Peds  Hematology negative hematology ROS (+) anemia ,   Anesthesia Other Findings   Reproductive/Obstetrics negative OB ROS                           Anesthesia Physical Anesthesia Plan  ASA: IV  Anesthesia Plan: General   Post-op Pain Management:    Induction: Intravenous  Airway Management Planned: Oral ETT  Additional Equipment: Arterial line  Intra-op Plan:   Post-operative Plan: Extubation in OR  Informed Consent: I have reviewed the patients History and Physical, chart, labs and discussed the procedure including the risks, benefits and alternatives for the proposed anesthesia with the patient or authorized representative who has indicated his/her  understanding and acceptance.   Dental advisory given  Plan Discussed with: CRNA  Anesthesia Plan Comments: (Had long discussion about albumin products for patient, but she was reluctant to accept. I discussed the high risk of major complications and death. She is adamant that  She not receive blood products.  Plan 2x PIV or CVL if inadequate access.)      Anesthesia Quick Evaluation

## 2014-07-09 NOTE — Op Note (Signed)
LAPAROSCOPIC ASSISTED PARTIAL COLECTOMY  Procedure Note  LADA FULBRIGHT 07/05/2014 - 07/09/2014   Pre-op Diagnosis: CECAL CANCER     Post-op Diagnosis: same  Procedure(s): LAPAROSCOPIC ASSISTED RIGHT ILEOCOLECTOMY  Surgeon(s): Coralie Keens, MD  Anesthesia: General  Staff:  Circulator: Montel Culver, RN Scrub Person: Beryle Lathe, RN; Cyd Silence, RN  Estimated Blood Loss: Minimal               Specimens: SENT TO PATH           Coralie Keens A   Date: 07/09/2014  Time: 10:49 AM

## 2014-07-09 NOTE — Op Note (Signed)
Teresa Flowers, BIEL              ACCOUNT NO.:  [id number]  MEDICAL RECORD NO.:  [id number]  LOCATION:  3S07C                        FACILITY:  [facility]  PHYSICIAN:  Coralie Keens, M.D. DATE OF BIRTH:  05-08-1940  DATE OF PROCEDURE:  07/09/2014 DATE OF DISCHARGE:                              OPERATIVE REPORT   PREOPERATIVE DIAGNOSIS:  Cecal cancer.  POSTOPERATIVE DIAGNOSIS:  Cecal cancer.  PROCEDURE:  Laparoscopic-assisted ileocolectomy.  SURGEON:  Coralie Keens, M.D.  ASSISTANT:  Judyann Munson, RNFA.  ANESTHESIA:  General endotracheal anesthesia.  ESTIMATED BLOOD LOSS:  Minimal.  INDICATIONS:  This is a 74 year old female who presented with anemia. She is a Restaurant manager, fast food.  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  Decision was made to proceed with laparoscopic-assisted partial colectomy.  FINDINGS:  The patient was found to have a small mass in the cecum as previous described on endoscopy.  There were no other interrupted abdominal abnormalities identified.  The liver appeared free of disease.  PROCEDURE IN DETAIL:  The patient was brought to the operating room, identified as Teresa Flowers.  She was placed supine on the operating room table and general anesthesia was induced.  Her abdomen was then prepped and draped in usual sterile fashion.  I made a small incision just above the umbilicus with a scalpel.  I took this down to the fascia which was then opened with a scalpel as well.  Hemostat was used to pass to the peritoneal cavity under direct vision.  A 0-Vicryl pursestring suture was then placed around the fascial opening.  The Hasson port was placed through the opening and insufflation of the abdomen was begun.  I placed a 5-mm port in the patient's lower midline and another in epigastric area, both under direct vision.  The patient had a very short right colon with the cecum mostly in the right upper quadrant.  I mobilized the white  line of Toldt with the Harmonic Scalpel.  It was difficult taking down the hepatic flexure as the colon went up underneath the liver and the gallbladder, but I was able to finally do this with a Harmonic Scalpel as well.  I was able to also mobilize the terminal ileum.  After mobilizing the colon completely from the cecum to the mid transverse colon,  I was able to pull it all way to the midline. At this point, I converted to the open part of the procedure.  All ports were removed.  I connected the 2 port sites above the midline with an incision between the 2 with a scalpel.  I then took this down to the fascia which was opened completely with the electrocautery.  A wound protector was then applied.  I was able to then easily eviscerate the cecum and terminal ileum all way to the transverse colon.  I transected the distal ileum with a GIA 75 stapler.  I then identified an area of proximal transverse colon and transected this with the GIA-75 stapler as well.  I then took down the mesentery with the ligature as well as clamps and 2-0 silk ties.  The entire specimen was then completely removed and sent to Pathology for evaluation.  I then realigned the small bowel to the transverse colon in side-to-side fashion with silk sutures.  I created an enterotomy and a colotomy with the cautery and then performed a side-to-side anastomosis with a single firing of the GIA-75 stapler.  I then closed the opening with a TA-60 stapler.  I then reinforced staple line with interrupted silk sutures and closed the mesenteric defect with silk sutures as well.  A wide well-perfused anastomosis appeared to be achieved.  At this point, I irrigated the abdomen with several liters of normal saline.  Hemostasis appeared to be achieved.  We then replaced all sterile dressings and changed our gowns and gloves.  I then closed the patient's midline fascia with a running #1 looped PDS suture.  All incisions were then  irrigated with saline, anesthetized with Marcaine, and then closed with 4-0 Monocryl subcuticular sutures.  Skin glue was then applied.  The patient tolerated the procedure well.  All the counts were correct at the end of procedure.  The patient was then extubated in the operating room and taken in a stable condition to recovery room.     Coralie Keens, M.D.     DB/MEDQ  D:  07/09/2014  T:  07/09/2014  Job:  [id number]

## 2014-07-09 NOTE — Transfer of Care (Signed)
Immediate Anesthesia Transfer of Care Note  Patient: Teresa Flowers  Procedure(s) Performed: Procedure(s): LAPAROSCOPIC ASSISTED PARTIAL COLECTOMY (N/A)  Patient Location: PACU  Anesthesia Type:General  Level of Consciousness: awake, alert  and oriented  Airway & Oxygen Therapy: Patient Spontanous Breathing and Patient connected to nasal cannula oxygen  Post-op Assessment: Report given to RN and Post -op Vital signs reviewed and stable  Post vital signs: Reviewed and stable  Last Vitals:  Filed Vitals:   07/09/14 1113  BP:   Pulse: 65  Temp: 36.7 C  Resp: 16    Complications: No apparent anesthesia complications

## 2014-07-09 NOTE — Progress Notes (Addendum)
Shift Event: Pt with hypoglycemia CBGs 50s. Ordered 1/2 amp D50 X2. Cont D5 1/2 NS at 84ml/hr.  Lacy Duverney [facility]

## 2014-07-09 NOTE — Consult Note (Addendum)
PULMONARY / CRITICAL CARE MEDICINE   Name: Teresa Flowers MRN: [id number] DOB: May 10, 1940    ADMISSION DATE:  07/05/2014 CONSULTATION DATE:  07/09/14  REFERRING MD :  Dr. Wynelle Cleveland   CHIEF COMPLAINT:  GIB   INITIAL PRESENTATION: 74 y/o F, Jehovah's Witness, admitted 3/12   STUDIES:  3/15  CT Chest >> cavitary & spiculated lung mass in superior segment of RLL, GGO and sub-solid nodules in R lung 3/15  CT ABD/Pelvis >> wall thickening in the cecum    SIGNIFICANT EVENTS: 3/12  Admit with melena   HISTORY OF PRESENT ILLNESS:  74 y/o F, Jehovah's Witness, with PMH of vertigo, hypothyroidism, hypertension, HLD, type 2 diabetes, CKD III, obesity, GERD, & CVA with balance deficits who presented to [facility] on 3/12 with melena. Patient reported on admission that she was in her usual state of health until the day prior to presentation when she began having multiple small bowel movements that were maroon in color and some BRBPR. ER workup was notable for hemoglobin of 8 (decreased from August 2015, 10.2), serum creatinine 2.39 (baseline 1.6) and a documented rectal exam by the EDP with dark brown and maroon-colored stool.  She denied NSAID use, weight loss, night sweats and abdominal pain.  Of note, she carries a family history of a brother who was diagnosed colon cancer at age 51 and a nephew who was diagnosed with colon cancer at age 51.  Patient was admitted per [facility] for further workup.  GI was consulted and a colonoscopy was performed on 3/14 that revealed a cecal mass as well as 7 other polyps throughout the colon. Colon polyps were removed and biopsies were taken of the mass. Pathology is pending.  She was further evaluated with a CT of the chest abdomen and pelvis for staging purposes which revealed a cavitary and spiculated lung mass in the superior segment of the right lower lobe, GGO, sub-solid nodules in the right lung, and wall thickening in the cecum.  Hemoglobin was noted to  drift down to 6.9.  Patient was taken to the OR on 3/16 or laparoscopic partial colectomy. She was returned to the PACU postoperatively and noted to be hypertensive. She had one large bloody bowel movement in the PACU. PCCM was consulted for evaluation.    PAST MEDICAL HISTORY :   has a past medical history of Essential hypertension; Vertigo; Hypothyroid; High cholesterol; Type 2 diabetes mellitus, uncontrolled, with renal complications; Anemia in chronic kidney disease; Stage III chronic kidney disease; Cerebrovascular disease; Obesity; Hypertensive urgency (12/16/2013); Refusal of blood product; TIA (transient ischemic attack); GERD (gastroesophageal reflux disease); and Intermediate coronary syndrome.  has past surgical history that includes Cardiovascular stress test (10/29/13); Colonoscopy; Cyst excision; left heart catheterization with coronary angiogram (N/A, 12/16/2013); Esophagogastroduodenoscopy (N/A, 07/06/2014); and Colonoscopy (N/A, 07/07/2014).   HOME MEDICATIONS: Prior to Admission medications   Medication Sig Start Date End Date Taking? Authorizing Provider  amLODipine (NORVASC) 5 MG tablet Take 5 mg by mouth daily.   Yes Historical Provider, MD  insulin glargine (LANTUS) 100 UNIT/ML injection Inject 0.2 mLs (20 Units total) into the skin at bedtime. Patient taking differently: Inject 20 Units into the skin at bedtime. <200 pt does not take 12/17/13  Yes Charlynne Cousins, MD  insulin lispro (HUMALOG) 100 UNIT/ML injection Inject 2-6 Units into the skin 3 (three) times daily before meals.    Yes Historical Provider, MD  levothyroxine (SYNTHROID, LEVOTHROID) 88 MCG tablet Take 88 mcg by mouth daily before  breakfast.   Yes Historical Provider, MD  lisinopril (PRINIVIL,ZESTRIL) 20 MG tablet Take 20 mg by mouth daily.   Yes Historical Provider, MD  rosuvastatin (CRESTOR) 10 MG tablet Take 10 mg by mouth at bedtime.    Yes Historical Provider, MD  B-D ULTRAFINE III SHORT PEN 31G X 8 MM MISC   06/19/14   Historical Provider, MD  meclizine (ANTIVERT) 25 MG tablet Take 25 mg by mouth 3 (three) times daily as needed for dizziness.    Historical Provider, MD  [employer] VERIO test strip  06/18/14   Historical Provider, MD  Vitamin D, Ergocalciferol, (DRISDOL) 50000 UNITS CAPS capsule Take 50,000 Units by mouth once a week. 06/26/14   Historical Provider, MD   Allergies  Allergen Reactions  . Other Other (See Comments)    All acid containing foods such as citrus, tomatoes "make me break out inside"    FAMILY HISTORY:  indicated that her mother is deceased. She indicated that her father is deceased. She indicated that her brother is alive.    SOCIAL HISTORY:  reports that she quit smoking about 21 years ago. She has never used smokeless tobacco. She reports that she does not drink alcohol or use illicit drugs.  REVIEW OF SYSTEMS:  Unable to complete as patient is drowsy post anesthesia  SUBJECTIVE:   VITAL SIGNS: Temp:  [98 F (36.7 C)-99 F (37.2 C)] 98 F (36.7 C) (03/16 1113) Pulse Rate:  [56-86] 65 (03/16 1315) Resp:  [11-20] 15 (03/16 1315) BP: (151-204)/(55-87) 160/55 mmHg (03/16 1315) SpO2:  [96 %-100 %] 100 % (03/16 1315) Arterial Line BP: (153-212)/(63-147) 203/82 mmHg (03/16 1315)   HEMODYNAMICS:     VENTILATOR SETTINGS:     INTAKE / OUTPUT:  Intake/Output Summary (Last 24 hours) at 07/09/14 1351 Last data filed at 07/09/14 1045  Gross per 24 hour  Intake 3527.5 ml  Output   2775 ml  Net  752.5 ml    PHYSICAL EXAMINATION: General:  Obese, adult female in NAD Neuro:  Drowsy, awakens in no distress, nods appropriately   Pupils reactive and EOM-I. HEENT:  [place]/AT, PERRL, EOM-I and DMM, pale.  Cardiovascular:  s1s2 rrr, no m/r/g Lungs:  resp's even/non-labored, lungs bilaterally clear  Abdomen:  Obese, soft, bsx4 hypoactive, midline surgical incision c/d/i, dressed with waffle dressing  Musculoskeletal:  No acute deformities  Skin:  Warm/dry, no  edema  LABS:  CBC  Recent Labs Lab 07/06/14 1639 07/07/14 0744 07/08/14 0618 07/09/14 0905  WBC 6.9 6.7 6.1  --   HGB 7.0* 7.7* 6.9* 5.4*  HCT 22.1* 24.2* 21.4* 16.0*  PLT 247 303 279  --    Coag's No results for input(s): APTT, INR in the last 168 hours.   BMET  Recent Labs Lab 07/07/14 0744 07/08/14 0618 07/09/14 0528 07/09/14 0905  NA 144 144 144 142  K 4.1 4.1 3.9 3.7  CL 117* 116* 117*  --   CO2 20 25 24   --   BUN 33* 24* 13  --   CREATININE 1.66* 1.78* 1.48*  --   GLUCOSE 135* 120* 99  --    Electrolytes  Recent Labs Lab 07/07/14 0744 07/08/14 0618 07/09/14 0528  CALCIUM 9.0 8.7 8.8   Sepsis Markers No results for input(s): LATICACIDVEN, PROCALCITON, O2SATVEN in the last 168 hours.   ABG  Recent Labs Lab 07/09/14 0905  PHART 7.416  PCO2ART 35.8  PO2ART 358.0*   Liver Enzymes  Recent Labs Lab 07/05/14 1152  AST 18  ALT 12  ALKPHOS 77  BILITOT 0.3  ALBUMIN 3.3*   Cardiac Enzymes No results for input(s): TROPONINI, PROBNP in the last 168 hours.   Glucose  Recent Labs Lab 07/08/14 2348 07/09/14 0410 07/09/14 0450 07/09/14 0646 07/09/14 0806 07/09/14 1116  GLUCAP 94 59* 112* 91 105* 130*    Imaging Ct Abdomen Pelvis Wo Contrast  07/08/2014   CLINICAL DATA:  Colon cancer  EXAM: CT CHEST, ABDOMEN, AND PELVIS WITHOUT CONTRAST  TECHNIQUE: Multidetector CT imaging of the chest, abdomen, and pelvis was performed following the standard protocol without IV contrast.  COMPARISON:  None.  FINDINGS: Lack of intravenous contrast. Oral contrast is only present in the colon an a few small bowel loops. These factors severely limits this examination. Metastatic disease can be missed by this study.  Small mediastinal nodes. Three vessel coronary artery calcifications.  No pneumothorax.  No pleural effusion.  Sub solid 10 mm opacity at the right apex. Spiculated and cavitary irregular lesion in the superior segment of the right lower lobe measures  1.6 x 2.8 cm on image 23. Vague ground-glass opacity in the right lower lobe on image 27. Left lung is clear.  The unenhanced liver, gallbladder, spleen, pancreas, adrenal glands are grossly within normal limits. Chronic changes of the kidneys which are lobulated. No obvious mass.  There is irregular wall thickening in this cecum. See image 80 of series 2. This may correspond to the patient's known mass in the cecum. The appendix is unremarkable. Terminal ileum is unremarkable. No obvious mesenteric adenopathy. No obvious retroperitoneal adenopathy by measurement criteria.  Calcified uterine fibroids. Bladder and adnexa are within normal limits.  No destructive bone lesion. Left hemi laminectomy at L5-S1. Spinal stenosis at L4-5. Facet arthropathy throughout the lumbar spine. Severe degenerative disc disease at L5-S1 with vacuum.  IMPRESSION: Limited study as delineated above.  1.6 x 2.8 cm cavitary and spiculated lung mass in the superior segment of the right lower lobe. PET-CT is recommended.  There are ground-glass opacities and sub solid nodules in the right lung as described.  No obvious mediastinal adenopathy.  Wall thickening in the cecum which may correspond to the patient's known lesion.   Electronically Signed   By: Marybelle Killings M.D.   On: 07/08/2014 15:36   Ct Chest Wo Contrast  07/08/2014   CLINICAL DATA:  Colon cancer  EXAM: CT CHEST, ABDOMEN, AND PELVIS WITHOUT CONTRAST  TECHNIQUE: Multidetector CT imaging of the chest, abdomen, and pelvis was performed following the standard protocol without IV contrast.  COMPARISON:  None.  FINDINGS: Lack of intravenous contrast. Oral contrast is only present in the colon an a few small bowel loops. These factors severely limits this examination. Metastatic disease can be missed by this study.  Small mediastinal nodes. Three vessel coronary artery calcifications.  No pneumothorax.  No pleural effusion.  Sub solid 10 mm opacity at the right apex. Spiculated and  cavitary irregular lesion in the superior segment of the right lower lobe measures 1.6 x 2.8 cm on image 23. Vague ground-glass opacity in the right lower lobe on image 27. Left lung is clear.  The unenhanced liver, gallbladder, spleen, pancreas, adrenal glands are grossly within normal limits. Chronic changes of the kidneys which are lobulated. No obvious mass.  There is irregular wall thickening in this cecum. See image 80 of series 2. This may correspond to the patient's known mass in the cecum. The appendix is unremarkable. Terminal ileum is unremarkable. No obvious mesenteric adenopathy. No  obvious retroperitoneal adenopathy by measurement criteria.  Calcified uterine fibroids. Bladder and adnexa are within normal limits.  No destructive bone lesion. Left hemi laminectomy at L5-S1. Spinal stenosis at L4-5. Facet arthropathy throughout the lumbar spine. Severe degenerative disc disease at L5-S1 with vacuum.  IMPRESSION: Limited study as delineated above.  1.6 x 2.8 cm cavitary and spiculated lung mass in the superior segment of the right lower lobe. PET-CT is recommended.  There are ground-glass opacities and sub solid nodules in the right lung as described.  No obvious mediastinal adenopathy.  Wall thickening in the cecum which may correspond to the patient's known lesion.   Electronically Signed   By: Marybelle Killings M.D.   On: 07/08/2014 15:36     ASSESSMENT / PLAN:  PULMONARY A: At Risk Atelectasis  Spiculated mass. P:   Pulmonary hygiene  Oxygen as needed for saturations >90% Monitor respiratory status with narcotics  Please arrange follow up with PCCM once recovers from surgery.  No interventions for now.  CARDIOVASCULAR CVL A:  Hypertension  HLD  P:  PRN Hydralazine for SBP > 170 Hold PO medications until patient more alert - norvasc  RENAL A:   CKD III P:   D5NS @ 75 ml/hr Trend BMP  Replace electrolytes as indicated   GASTROINTESTINAL A:   Cecal Mass, Suspect Colon Cancer  - s/p biopsy 3/15 and colon resection 3/16 GERD Obesity  P:   NPO Defer to CCS for timing of feeding    HEMATOLOGIC A:   Anemia  JEHOVAH's WITNESS - NO BLOOD PRODUCTS P: Limit blood draws as able, pediatric tubes if possible Change CBC to in am (await post-op labs)  INFECTIOUS A:   No acute infectious process  P:   Monitor fever curve / WBC  ENDOCRINE A:   Hypothyroidism   DM II  P:   Change to IV synthroid SSI   NEUROLOGIC A:   Hx CVA - residual balance deficits  P:   RASS goal: n/a  Morphine PCA for pain   PCCM will be available PRN.  Please call back if we can be of further assistance.   Noe Gens, NP-C [facility] Pgr: [phone] or [phone]  74 year old jehova's wittness female who just had a hemi colectomy for a mass.  Hg is steadily dropping but patient is refusing transfusion.  Chest CT I reviewed myself with a  Spiculated mass on the RLL mass.  Mental status is altered but patient is protecting her airway.  Severe anemia due to GI bleeding. - Monitor H/H but less frequently since we will not be transfusing regardless. - Recommend starting epogen. - Agree with iron infusion. - No blood transfusion.  Colonic mass: post op hemi colectomy. - Pain control. - Minimize narcs as able however given current ment status.  Significant mental status changes: Post anesthetic most likely. - Minimize narcotic use as able. - Monitor closely in the SDU. - No need for ICU at this time.  Lung mass - Patient is currently acutely ill, no bronchoscopy at this time. - Arrange f/u with PCCM as outpatient with repeat imaging.  Ok to admit to SDU, PCCM will sign off, please call back if needed.  Patient seen and examined, agree with above note.  I dictated the care and orders written for this patient under my direction.  Rush Farmer, MD [phone]  07/09/2014, 1:51 PM

## 2014-07-10 ENCOUNTER — Encounter (HOSPITAL_COMMUNITY): Payer: Self-pay | Admitting: Surgery

## 2014-07-10 DIAGNOSIS — E1129 Type 2 diabetes mellitus with other diabetic kidney complication: Secondary | ICD-10-CM

## 2014-07-10 DIAGNOSIS — E1165 Type 2 diabetes mellitus with hyperglycemia: Secondary | ICD-10-CM

## 2014-07-10 LAB — CBC
HCT: 15.2 % — ABNORMAL LOW (ref 36.0–46.0)
Hemoglobin: 4.9 g/dL — CL (ref 12.0–15.0)
MCH: 28.8 pg (ref 26.0–34.0)
MCHC: 32.2 g/dL (ref 30.0–36.0)
MCV: 89.4 fL (ref 78.0–100.0)
PLATELETS: 245 10*3/uL (ref 150–400)
RBC: 1.7 MIL/uL — ABNORMAL LOW (ref 3.87–5.11)
RDW: 14.6 % (ref 11.5–15.5)
WBC: 13.1 10*3/uL — ABNORMAL HIGH (ref 4.0–10.5)

## 2014-07-10 LAB — GLUCOSE, CAPILLARY
GLUCOSE-CAPILLARY: 270 mg/dL — AB (ref 70–99)
GLUCOSE-CAPILLARY: 69 mg/dL — AB (ref 70–99)
Glucose-Capillary: 266 mg/dL — ABNORMAL HIGH (ref 70–99)
Glucose-Capillary: 352 mg/dL — ABNORMAL HIGH (ref 70–99)
Glucose-Capillary: 294 mg/dL — ABNORMAL HIGH (ref 70–99)
Glucose-Capillary: 214 mg/dL — ABNORMAL HIGH (ref 70–99)
Glucose-Capillary: 131 mg/dL — ABNORMAL HIGH (ref 70–99)
Glucose-Capillary: 196 mg/dL — ABNORMAL HIGH (ref 70–99)
Glucose-Capillary: 162 mg/dL — ABNORMAL HIGH (ref 70–99)

## 2014-07-10 MED ORDER — INSULIN ASPART 100 UNIT/ML ~~LOC~~ SOLN
5.0000 [IU] | Freq: Once | SUBCUTANEOUS | Status: AC
  Administered 2014-07-10: 5 [IU] via SUBCUTANEOUS

## 2014-07-10 MED ORDER — INSULIN ASPART 100 UNIT/ML ~~LOC~~ SOLN: 0.0000 [IU] | Freq: Three times a day (TID) | SUBCUTANEOUS | Status: DC

## 2014-07-10 MED ORDER — SODIUM CHLORIDE 0.9 % IV SOLN
500.0000 mg | Freq: Once | INTRAVENOUS | Status: AC
  Administered 2014-07-10: 500 mg via INTRAVENOUS
  Filled 2014-07-10: qty 10

## 2014-07-10 MED ORDER — DEXTROSE 50 % IV SOLN
1.0000 | Freq: Once | INTRAVENOUS | Status: AC
  Administered 2014-07-10: 50 mL via INTRAVENOUS
  Filled 2014-07-10: qty 50

## 2014-07-10 MED ORDER — INSULIN ASPART 100 UNIT/ML ~~LOC~~ SOLN
0.0000 [IU] | Freq: Three times a day (TID) | SUBCUTANEOUS | Status: DC
  Administered 2014-07-11: 2 [IU] via SUBCUTANEOUS
  Administered 2014-07-11: 1 [IU] via SUBCUTANEOUS
  Administered 2014-07-11 – 2014-07-12 (×2): 2 [IU] via SUBCUTANEOUS
  Administered 2014-07-12: 1 [IU] via SUBCUTANEOUS
  Administered 2014-07-12: 2 [IU] via SUBCUTANEOUS

## 2014-07-10 MED ORDER — SODIUM CHLORIDE 0.9 % IV SOLN
INTRAVENOUS | Status: DC
  Administered 2014-07-10: 14:00:00 via INTRAVENOUS
  Administered 2014-07-11: 100 mL/h via INTRAVENOUS
  Administered 2014-07-12: 15:00:00 via INTRAVENOUS

## 2014-07-10 MED ORDER — INSULIN GLARGINE 100 UNIT/ML ~~LOC~~ SOLN
10.0000 [IU] | Freq: Every day | SUBCUTANEOUS | Status: DC
  Administered 2014-07-10: 10 [IU] via SUBCUTANEOUS
  Filled 2014-07-10: qty 0.1

## 2014-07-10 MED ORDER — ALVIMOPAN 12 MG PO CAPS: 12.0000 mg | ORAL_CAPSULE | Freq: Two times a day (BID) | ORAL | Status: DC

## 2014-07-10 MED ORDER — INSULIN ASPART 100 UNIT/ML ~~LOC~~ SOLN: 0.0000 [IU] | Freq: Every day | SUBCUTANEOUS | Status: DC

## 2014-07-10 MED ORDER — DEXTROSE 50 % IV SOLN
INTRAVENOUS | Status: AC
  Administered 2014-07-10: 50 mL via INTRAVENOUS
  Filled 2014-07-10: qty 50

## 2014-07-10 MED ORDER — DARBEPOETIN ALFA 100 MCG/0.5ML IJ SOSY
100.0000 ug | PREFILLED_SYRINGE | Freq: Once | INTRAMUSCULAR | Status: AC
  Administered 2014-07-10: 100 ug via SUBCUTANEOUS
  Filled 2014-07-10: qty 0.5

## 2014-07-10 MED ORDER — INSULIN ASPART 100 UNIT/ML ~~LOC~~ SOLN
0.0000 [IU] | SUBCUTANEOUS | Status: DC
  Administered 2014-07-10: 5 [IU] via SUBCUTANEOUS
  Administered 2014-07-10: 8 [IU] via SUBCUTANEOUS

## 2014-07-10 MED ORDER — ALVIMOPAN 12 MG PO CAPS
12.0000 mg | ORAL_CAPSULE | Freq: Two times a day (BID) | ORAL | Status: AC
  Administered 2014-07-10 – 2014-07-16 (×13): 12 mg via ORAL
  Filled 2014-07-10 (×15): qty 1

## 2014-07-10 NOTE — Progress Notes (Signed)
Medicare Important Message given? YES (If response is "NO", the following Medicare IM given date fields will be blank) Date Medicare IM given:07/10/2014 Medicare IM given by: Whitman Hero

## 2014-07-10 NOTE — Progress Notes (Signed)
CRITICAL VALUE ALERT  Critical value received:  Hemoglobin = 4.9  Date of notification:  07/10/2014  Time of notification:  0510  Critical value read back:Yes.    Nurse who received alert:  K. Royden Purl  MD notified (1st page):  Alene Mires, [place]  Time of first page:  0510  MD notified (2nd page):  Time of second page:  Responding MD:  Alene Mires  Time MD responded:  [phone]  No new orders received. Will continue to monitor.

## 2014-07-10 NOTE — Progress Notes (Addendum)
[facility] Progress Note   Teresa Flowers ZJQ:[id number] DOB: [birth year] DOA: 07/05/2014 PCP: Wallene Dales, MD  Brief narrative: Teresa Flowers is a 74 y.o. 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   Subjective: C/o nausea and then began vomiting up the clear liquids she had for lunch while I was in the room.    Assessment/Plan: Principal Problem:   GI bleed/ melena/ cecal mass and 7 polyps  - EGD unrevealing- Colonoscopy shows a 4 cm mass in right colon- GI has done biopsies and are recommending a gen surgery consult  - s/p colectomy  - has had 3 blood stools since surgery- surg started clears- will put back to [employer] as she is vomiting - notified surgery   Active Problems:   Acute blood loss anemia - declining blood as she is a Jehovah's wittness - Iron levels normal but low normal- Given IV Dextran 500 cc to boost stores on 3/16 - Hb now 4.9- given more IV Iron today and Epogen    Essential hypertension - Norvasc    Hypothyroid - Levothyroxine    Type 2 diabetes mellitus, uncontrolled, with renal complications -follow on ISS and low dose lantus for now    ARF on CKD Stage III chronic kidney disease - prerenal- Cr improved from admission (2.39) - now improved from baseline which is about 1.5-1.7  Appt with PCP: requested Code Status: full code Family Communication: spoke with husband on 3/16 Disposition Plan: home when stable - with HHPT DVT prophylaxis: SCDs Consultants:GI Procedures:EGD pending  Antibiotics: Anti-infectives    Start     Dose/Rate Route Frequency Ordered Stop   07/09/14 0600  cefoTEtan (CEFOTAN) 2 g in dextrose 5 % 50 mL IVPB     2 g 100 mL/hr over 30 Minutes Intravenous On call to O.R. 07/08/14 1533 07/09/14 0850      Objective: Filed Weights    07/05/14 1138 07/05/14 1708  Weight: 102.059 kg (225 lb) 101.5 kg (223 lb 12.3 oz)    Intake/Output Summary (Last 24 hours) at 07/10/14 1426 Last data filed at 07/10/14 1100  Gross per 24 hour  Intake 2545.5 ml  Output    700 ml  Net 1845.5 ml     Vitals Filed Vitals:   07/10/14 0800 07/10/14 0900 07/10/14 1000 07/10/14 1130  BP: 124/59 141/46 125/61   Pulse: 101 102 97   Temp:    98.9 F (37.2 C)  TempSrc:    Oral  Resp: 16 14 13    Height:      Weight:      SpO2: 100% 97% 97%     Exam:  General:  Pt is alert, not in acute distress  HEENT: No icterus, No thrush  Cardiovascular: regular rate and rhythm, S1/S2 No murmur  Respiratory: clear to auscultation bilaterally   Abdomen: Soft, +Bowel sounds,  non distended, no guarding  MSK: No LE edema, cyanosis or clubbing  Data Reviewed: Basic Metabolic Panel:  Recent Labs Lab 07/05/14 1152 07/06/14 0625 07/07/14 0744 07/08/14 0618 07/09/14 0528 07/09/14 0905  NA 139 143 144 144 144 142  K 4.3 3.4* 4.1 4.1 3.9 3.7  CL 108 112 117* 116* 117*  --   CO2 21 24 20 25 24   --   GLUCOSE 283* 96 135* 120* 99  --  BUN 63* 54* 33* 24* 13  --   CREATININE 2.39* 1.87* 1.66* 1.78* 1.48*  --   CALCIUM 9.0 8.7 9.0 8.7 8.8  --    Liver Function Tests:  Recent Labs Lab 07/05/14 1152  AST 18  ALT 12  ALKPHOS 77  BILITOT 0.3  PROT 6.7  ALBUMIN 3.3*   No results for input(s): LIPASE, AMYLASE in the last 168 hours. No results for input(s): AMMONIA in the last 168 hours. CBC:  Recent Labs Lab 07/06/14 1639 07/07/14 0744 07/08/14 0618 07/09/14 0905 07/09/14 1315 07/10/14 0406  WBC 6.9 6.7 6.1  --  12.1* 13.1*  HGB 7.0* 7.7* 6.9* 5.4* 7.0* 4.9*  HCT 22.1* 24.2* 21.4* 16.0* 21.8* 15.2*  MCV 87.0 87.7 88.8  --  89.0 89.4  PLT 247 303 279  --  308 245   Cardiac Enzymes: No results for input(s): CKTOTAL, CKMB, CKMBINDEX, TROPONINI in the last 168 hours. BNP (last 3 results) No results for input(s): BNP in  the last 8760 hours.  ProBNP (last 3 results) No results for input(s): PROBNP in the last 8760 hours.  CBG:  Recent Labs Lab 07/09/14 2034 07/09/14 2329 07/10/14 0403 07/10/14 0746 07/10/14 1132  GLUCAP 270* 266* 352* 294* 214*    Recent Results (from the past 240 hour(s))  Surgical pcr screen     Status: None   Collection Time: 07/08/14  8:16 PM  Result Value Ref Range Status   MRSA, PCR NEGATIVE NEGATIVE Final   Staphylococcus aureus NEGATIVE NEGATIVE Final    Comment:        The Xpert SA Assay (FDA approved for NASAL specimens in patients over 82 years of age), is one component of a comprehensive surveillance program.  Test performance has been validated by [facility] for patients greater than or equal to 75 year old. It is not intended to diagnose infection nor to guide or monitor treatment.      Studies:  Recent x-ray studies have been reviewed in detail by the Attending Physician  Scheduled Meds:  Scheduled Meds: . [START ON 07/11/2014] alvimopan  12 mg Oral BID  . insulin aspart  0-15 Units Subcutaneous Q4H  . insulin glargine  10 Units Subcutaneous Daily  . levothyroxine  44 mcg Intravenous Daily  . morphine   Intravenous 6 times per day  . sodium chloride  500 mL Intravenous Once  . sodium chloride  3 mL Intravenous Q12H   Continuous Infusions: . sodium chloride 100 mL/hr at 07/10/14 1358    Time spent on care of this patient: 35 min   [place], MD 07/10/2014, 2:26 PM  LOS: 5 days   [facility] Office  [phone] Pager - Text Page per [email or web address]  If 7PM-7AM, please contact night-coverage [email or web address]

## 2014-07-10 NOTE — Progress Notes (Signed)
Patient ID: Teresa Flowers, female   DOB: 15-Sep-1940, 74 y.o.   MRN: [id number] 1 Day Post-Op  Subjective: Pt is hungry this morning.  Otherwise pain controled and feeling well.  Denies dizziness or fatigue, but hasn't been up yet either  Objective: Vital signs in last 24 hours: Temp:  [98 F (36.7 C)-99.5 F (37.5 C)] 99.5 F (37.5 C) (03/17 0700) Pulse Rate:  [56-98] 92 (03/17 0400) Resp:  [11-18] 13 (03/17 0400) BP: (115-204)/(47-87) 115/47 mmHg (03/17 0400) SpO2:  [97 %-100 %] 97 % (03/17 0400) Arterial Line BP: (122-212)/(43-147) 123/43 mmHg (03/17 0400) Last BM Date: 07/09/14  Intake/Output from previous day: 03/16 0701 - 03/17 0700 In: 3248 [I.V.:3248] Out: 1275 [Urine:1240; Blood:35] Intake/Output this shift:    PE: Abd: soft, minimally tender, incision c/d/di with staples, old bloody drainage noted on bandage, but nothing acute.  Hypoactive BS, but ND Heart: regular  Lab Results:   Recent Labs  07/09/14 1315 07/10/14 0406  WBC 12.1* 13.1*  HGB 7.0* 4.9*  HCT 21.8* 15.2*  PLT 308 245   BMET  Recent Labs  07/08/14 0618 07/09/14 0528 07/09/14 0905  NA 144 144 142  K 4.1 3.9 3.7  CL 116* 117*  --   CO2 25 24  --   GLUCOSE 120* 99  --   BUN 24* 13  --   CREATININE 1.78* 1.48*  --   CALCIUM 8.7 8.8  --    PT/INR  Recent Labs  07/09/14 1940  LABPROT 15.3*  INR 1.19   CMP     Component Value Date/Time   NA 142 07/09/2014 0905   K 3.7 07/09/2014 0905   CL 117* 07/09/2014 0528   CO2 24 07/09/2014 0528   GLUCOSE 99 07/09/2014 0528   BUN 13 07/09/2014 0528   CREATININE 1.48* 07/09/2014 0528   CALCIUM 8.8 07/09/2014 0528   PROT 6.7 07/05/2014 1152   ALBUMIN 3.3* 07/05/2014 1152   AST 18 07/05/2014 1152   ALT 12 07/05/2014 1152   ALKPHOS 77 07/05/2014 1152   BILITOT 0.3 07/05/2014 1152   GFRNONAA 34* 07/09/2014 0528   GFRAA 39* 07/09/2014 0528   Lipase  No results found for: LIPASE     Studies/Results: Ct Abdomen Pelvis Wo  Contrast  07/08/2014   CLINICAL DATA:  Colon cancer  EXAM: CT CHEST, ABDOMEN, AND PELVIS WITHOUT CONTRAST  TECHNIQUE: Multidetector CT imaging of the chest, abdomen, and pelvis was performed following the standard protocol without IV contrast.  COMPARISON:  None.  FINDINGS: Lack of intravenous contrast. Oral contrast is only present in the colon an a few small bowel loops. These factors severely limits this examination. Metastatic disease can be missed by this study.  Small mediastinal nodes. Three vessel coronary artery calcifications.  No pneumothorax.  No pleural effusion.  Sub solid 10 mm opacity at the right apex. Spiculated and cavitary irregular lesion in the superior segment of the right lower lobe measures 1.6 x 2.8 cm on image 23. Vague ground-glass opacity in the right lower lobe on image 27. Left lung is clear.  The unenhanced liver, gallbladder, spleen, pancreas, adrenal glands are grossly within normal limits. Chronic changes of the kidneys which are lobulated. No obvious mass.  There is irregular wall thickening in this cecum. See image 80 of series 2. This may correspond to the patient's known mass in the cecum. The appendix is unremarkable. Terminal ileum is unremarkable. No obvious mesenteric adenopathy. No obvious retroperitoneal adenopathy by measurement criteria.  Calcified  uterine fibroids. Bladder and adnexa are within normal limits.  No destructive bone lesion. Left hemi laminectomy at L5-S1. Spinal stenosis at L4-5. Facet arthropathy throughout the lumbar spine. Severe degenerative disc disease at L5-S1 with vacuum.  IMPRESSION: Limited study as delineated above.  1.6 x 2.8 cm cavitary and spiculated lung mass in the superior segment of the right lower lobe. PET-CT is recommended.  There are ground-glass opacities and sub solid nodules in the right lung as described.  No obvious mediastinal adenopathy.  Wall thickening in the cecum which may correspond to the patient's known lesion.    Electronically Signed   By: Marybelle Killings M.D.   On: 07/08/2014 15:36   Ct Chest Wo Contrast  07/08/2014   CLINICAL DATA:  Colon cancer  EXAM: CT CHEST, ABDOMEN, AND PELVIS WITHOUT CONTRAST  TECHNIQUE: Multidetector CT imaging of the chest, abdomen, and pelvis was performed following the standard protocol without IV contrast.  COMPARISON:  None.  FINDINGS: Lack of intravenous contrast. Oral contrast is only present in the colon an a few small bowel loops. These factors severely limits this examination. Metastatic disease can be missed by this study.  Small mediastinal nodes. Three vessel coronary artery calcifications.  No pneumothorax.  No pleural effusion.  Sub solid 10 mm opacity at the right apex. Spiculated and cavitary irregular lesion in the superior segment of the right lower lobe measures 1.6 x 2.8 cm on image 23. Vague ground-glass opacity in the right lower lobe on image 27. Left lung is clear.  The unenhanced liver, gallbladder, spleen, pancreas, adrenal glands are grossly within normal limits. Chronic changes of the kidneys which are lobulated. No obvious mass.  There is irregular wall thickening in this cecum. See image 80 of series 2. This may correspond to the patient's known mass in the cecum. The appendix is unremarkable. Terminal ileum is unremarkable. No obvious mesenteric adenopathy. No obvious retroperitoneal adenopathy by measurement criteria.  Calcified uterine fibroids. Bladder and adnexa are within normal limits.  No destructive bone lesion. Left hemi laminectomy at L5-S1. Spinal stenosis at L4-5. Facet arthropathy throughout the lumbar spine. Severe degenerative disc disease at L5-S1 with vacuum.  IMPRESSION: Limited study as delineated above.  1.6 x 2.8 cm cavitary and spiculated lung mass in the superior segment of the right lower lobe. PET-CT is recommended.  There are ground-glass opacities and sub solid nodules in the right lung as described.  No obvious mediastinal adenopathy.   Wall thickening in the cecum which may correspond to the patient's known lesion.   Electronically Signed   By: Marybelle Killings M.D.   On: 07/08/2014 15:36    Anti-infectives: Anti-infectives    Start     Dose/Rate Route Frequency Ordered Stop   07/09/14 0600  cefoTEtan (CEFOTAN) 2 g in dextrose 5 % 50 mL IVPB     2 g 100 mL/hr over 30 Minutes Intravenous On call to O.R. 07/08/14 1533 07/09/14 0850       Assessment/Plan  POD 1, s/p ex lap with right colectomy for adenocarcinoma -advance to clear liquids -cont entereg -cont PCA today, if diet is advanced tomorrow will DC and switch to orals -mobilize and pulm toilet  -hgb is 4.9 this morning.  Patient still needs to try and mobilize as this is not going to necessarily get better as she won't accept blood transfusions. ABL anemia -hgb 4.9.   -decrease IVFs to not continue to dilute her out and since she will start clear liquids today, she  does not need as much. DVT Prophylaxis -SCD/ no chemical given anemia  LOS: 5 days    Cyprian Gongaware E 07/10/2014, 8:49 AM Pager: [phone]

## 2014-07-10 NOTE — Anesthesia Postprocedure Evaluation (Signed)
Anesthesia Post Note  Patient: Teresa Flowers  Procedure(s) Performed: Procedure(s) (LRB): LAPAROSCOPIC ASSISTED PARTIAL COLECTOMY (N/A)  Anesthesia type: General  Patient location: PACU  Post pain: Pain level controlled  Post assessment: Post-op Vital signs reviewed  Last Vitals: BP 152/50 mmHg  Pulse 113  Temp(Src) 37.2 C (Oral)  Resp 15  Ht 5\' 5"  (1.651 m)  Wt 223 lb 12.3 oz (101.5 kg)  BMI 37.24 kg/m2  SpO2 100%  Post vital signs: Reviewed  Level of consciousness: sedated  Complications: No apparent anesthesia complications

## 2014-07-10 NOTE — Progress Notes (Signed)
Physical Therapy Treatment Patient Details Name: Teresa Flowers MRN: [id number] DOB: 08-26-1940 Today's Date: 07/10/2014    History of Present Illness Pt admit with GIB.      PT Comments    Pt with hemoglobin of 4.9 this am, however spoke with RN who indicates MD wants pt seen by PT as pt declining blood transfusions.  Throughout session pt only c/o feeling tired and no c/o dizziness or feeling lightheaded.  Mobility limited to OOB to chair this session.  Will continue to follow.    Follow Up Recommendations  Home health PT;Supervision/Assistance - 24 hour (and HHOT)     Equipment Recommendations  None recommended by PT    Recommendations for Other Services       Precautions / Restrictions Precautions Precautions: Fall Restrictions Weight Bearing Restrictions: No    Mobility  Bed Mobility Overal bed mobility: Needs Assistance Bed Mobility: Supine to Sit     Supine to sit: Min assist     General bed mobility comments: A with bringing trunk up to sitting.    Transfers Overall transfer level: Needs assistance Equipment used: 1 person hand held assist Transfers: Sit to/from [facility] Sit to Stand: Min assist Stand pivot transfers: Min guard       General transfer comment: pt needs minimal A to power up to standing, but demos good balance with pivot to chair.    Ambulation/Gait                 Stairs            Wheelchair Mobility    Modified Rankin (Stroke Patients Only)       Balance Overall balance assessment: Needs assistance Sitting-balance support: No upper extremity supported;Feet supported Sitting balance-Leahy Scale: Fair     Standing balance support: No upper extremity supported;During functional activity Standing balance-Leahy Scale: Fair                      Cognition Arousal/Alertness: Awake/alert Behavior During Therapy: WFL for tasks assessed/performed Overall Cognitive Status: Within  Functional Limits for tasks assessed                      Exercises      General Comments        Pertinent Vitals/Pain Pain Assessment: No/denies pain    Home Living                      Prior Function            PT Goals (current goals can now be found in the care plan section) Acute Rehab PT Goals Patient Stated Goal: to go home PT Goal Formulation: With patient Time For Goal Achievement: 07/13/14 Potential to Achieve Goals: Good Progress towards PT goals: Progressing toward goals    Frequency  Min 3X/week    PT Plan Current plan remains appropriate    Co-evaluation             End of Session   Activity Tolerance: Patient limited by fatigue Patient left: in chair;with call bell/phone within reach     Time: 6256-3893 PT Time Calculation (min) (ACUTE ONLY): 15 min  Charges:  $Therapeutic Activity: 8-22 mins                    G CodesCatarina Hartshorn, [place] [phone] 07/10/2014, 4:04 PM

## 2014-07-10 NOTE — Progress Notes (Signed)
Patient on clear liquid diet and vomitted x1.  Diet discontinued and patient on ice chips. Zofran given and will continue to monitor patient.

## 2014-07-10 NOTE — Progress Notes (Signed)
Inpatient Diabetes Program Recommendations  AACE/ADA: New Consensus Statement on Inpatient Glycemic Control (2013)  Target Ranges:  Prepandial:   less than 140 mg/dL      Peak postprandial:   less than 180 mg/dL (1-2 hours)      Critically ill patients:  140 - 180 mg/dL   Reason for Assessment:  Results for KINYA, MEINE (MRN [id number]) as of 07/10/2014 13:47  Ref. Range 07/09/2014 20:34 07/09/2014 23:29 07/10/2014 04:03 07/10/2014 07:46 07/10/2014 11:32  Glucose-Capillary Latest Range: 70-99 mg/dL 270 (H) 266 (H) 352 (H) 294 (H) 214 (H)   Note that CBG's greater than goal.  Please consider increasing Lantus to home dose of 20 units daily.    Thanks, Adah Perl, RN, BC-ADM Inpatient Diabetes Coordinator Pager [phone]

## 2014-07-10 NOTE — Progress Notes (Signed)
PT Cancellation Note  Patient Details Name: TANAYSIA BHARDWAJ MRN: [id number] DOB: 17-May-1940   Cancelled Treatment:    Reason Eval/Treat Not Completed: Patient not medically ready.  Pt with hemoglobin 4.9 this morning.  Will hold PT and mobility at this time and f/u as appropriate.     Loanne Emery, Thornton Papas 07/10/2014, 8:19 AM

## 2014-07-11 LAB — CBC
HEMATOCRIT: 13.8 % — AB (ref 36.0–46.0)
HEMOGLOBIN: 4.3 g/dL — AB (ref 12.0–15.0)
MCH: 28.3 pg (ref 26.0–34.0)
MCHC: 31.2 g/dL (ref 30.0–36.0)
MCV: 90.8 fL (ref 78.0–100.0)
Platelets: 259 10*3/uL (ref 150–400)
RBC: 1.52 MIL/uL — ABNORMAL LOW (ref 3.87–5.11)
RDW: 15.8 % — AB (ref 11.5–15.5)
WBC: 15.7 10*3/uL — ABNORMAL HIGH (ref 4.0–10.5)

## 2014-07-11 LAB — GLUCOSE, CAPILLARY
GLUCOSE-CAPILLARY: 166 mg/dL — AB (ref 70–99)
GLUCOSE-CAPILLARY: 170 mg/dL — AB (ref 70–99)
GLUCOSE-CAPILLARY: 172 mg/dL — AB (ref 70–99)
GLUCOSE-CAPILLARY: 178 mg/dL — AB (ref 70–99)
Glucose-Capillary: 135 mg/dL — ABNORMAL HIGH (ref 70–99)
Glucose-Capillary: 138 mg/dL — ABNORMAL HIGH (ref 70–99)

## 2014-07-11 MED ORDER — INSULIN GLARGINE 100 UNIT/ML ~~LOC~~ SOLN
8.0000 [IU] | Freq: Every day | SUBCUTANEOUS | Status: DC
  Administered 2014-07-11 – 2014-07-14 (×4): 8 [IU] via SUBCUTANEOUS
  Filled 2014-07-11 (×5): qty 0.08

## 2014-07-11 MED ORDER — SODIUM CHLORIDE 0.9 % IV SOLN: Freq: Once | INTRAVENOUS | Status: DC

## 2014-07-11 MED ORDER — PROMETHAZINE HCL 25 MG/ML IJ SOLN
12.5000 mg | Freq: Four times a day (QID) | INTRAMUSCULAR | Status: DC | PRN
  Administered 2014-07-11 – 2014-07-12 (×2): 12.5 mg via INTRAVENOUS
  Filled 2014-07-11 (×2): qty 1

## 2014-07-11 MED ORDER — WHITE PETROLATUM GEL
Status: AC
  Administered 2014-07-11: 16:00:00
  Filled 2014-07-11: qty 1

## 2014-07-11 NOTE — Progress Notes (Deleted)
Wasted 80ml of Morphine from PCA in sharps. Merlene Laughter second RN to verify. Will continue to monitor pt.

## 2014-07-11 NOTE — Progress Notes (Signed)
Made MD Rizwan aware of family's wishes :for pt to receive blood. Per the chart pt has a signed and witnessed refusal of all blood and/or blood products.

## 2014-07-11 NOTE — Progress Notes (Signed)
Patient ID: DANETTE WEINFELD, female   DOB: Apr 02, 1941, 74 y.o.   MRN: [id number] 2 Days Post-Op  Subjective: Pt more lethargic today per RN and seems more sleepy when I'm in the room.  Patient wants to know why I can't give her something to make her blood levels go up.  Objective: Vital signs in last 24 hours: Temp:  [98.8 F (37.1 C)-100.5 F (38.1 C)] 99 F (37.2 C) (03/18 0746) Pulse Rate:  [97-113] 105 (03/18 0724) Resp:  [13-23] 15 (03/18 0746) BP: (110-152)/(46-70) 132/65 mmHg (03/18 0724) SpO2:  [91 %-100 %] 96 % (03/18 0746) Last BM Date: 07/09/14  Intake/Output from previous day: 03/17 0701 - 03/18 0700 In: 2275.8 [I.V.:2015.8; IV Piggyback:260] Out: 600 [Urine:600] Intake/Output this shift:    PE: Abd: soft, hypoactive BS, appropriately tender, ND, incision c/d/i Heart: tachy, but regular Lungs: CTAB  Lab Results:   Recent Labs  07/10/14 0406 07/11/14 0324  WBC 13.1* 15.7*  HGB 4.9* 4.3*  HCT 15.2* 13.8*  PLT 245 259   BMET  Recent Labs  07/09/14 0528 07/09/14 0905  NA 144 142  K 3.9 3.7  CL 117*  --   CO2 24  --   GLUCOSE 99  --   BUN 13  --   CREATININE 1.48*  --   CALCIUM 8.8  --    PT/INR  Recent Labs  07/09/14 1940  LABPROT 15.3*  INR 1.19   CMP     Component Value Date/Time   NA 142 07/09/2014 0905   K 3.7 07/09/2014 0905   CL 117* 07/09/2014 0528   CO2 24 07/09/2014 0528   GLUCOSE 99 07/09/2014 0528   BUN 13 07/09/2014 0528   CREATININE 1.48* 07/09/2014 0528   CALCIUM 8.8 07/09/2014 0528   PROT 6.7 07/05/2014 1152   ALBUMIN 3.3* 07/05/2014 1152   AST 18 07/05/2014 1152   ALT 12 07/05/2014 1152   ALKPHOS 77 07/05/2014 1152   BILITOT 0.3 07/05/2014 1152   GFRNONAA 34* 07/09/2014 0528   GFRAA 39* 07/09/2014 0528   Lipase  No results found for: LIPASE     Studies/Results: No results found.  Anti-infectives: Anti-infectives    Start     Dose/Rate Route Frequency Ordered Stop   07/09/14 0600  cefoTEtan (CEFOTAN)  2 g in dextrose 5 % 50 mL IVPB     2 g 100 mL/hr over 30 Minutes Intravenous On call to O.R. 07/08/14 1533 07/09/14 0850       Assessment/Plan  POD 2, s/p ex lap with right colectomy for adenocarcinoma -advance back to clear liquids today and see how she does, would not advance past this yet -cont entereg until she has a post op BM -cont PCA while on clears -mobilize and pulm toilet  -hgb is 4.3 this morning. Patient wants to know why I can't give her something to make her hgb go up.  I informed her that blood is the only acute thing that can make her levels go up and her feel better.  She asks about beets.  I explained that this is a long-term enhancer and will not acutely help her.  I also explained why she can't have solid food yet.  I had a d/w her and her husband about the risks of her hgb being so low such as SOB, lethargy, heart attack, and even death. ABL anemia -hgb 4.3.  - see above DVT Prophylaxis -SCD/ no chemical given anemia   LOS: 6 days  Evin Loiseau E 07/11/2014, 8:37 AM Pager: [phone]

## 2014-07-11 NOTE — Progress Notes (Signed)
Pt nauseous and vomiting. Vomit was red tinged (possibly from jello with breakfast). MD paged, and notified. Antiemetic given.

## 2014-07-11 NOTE — Progress Notes (Signed)
Called earlier today regarding the patients decision not to receive blood products.  The patient signed a refusal for blood products on 07/09/14.  Today, 07/11/14, she signed a POA to refuse blood products in the event she cannot make decisions.  Dr. Wynelle Cleveland stated on her assessment this morning the patient was oriented and capable of signing the document.  I explained to the patients husband and family we will be honoring her wishes to not transfuse.  Loree Fee, International aid/development worker present during the conversation with family.  Dr. Ninfa Linden and Dr. Wynelle Cleveland have been updated.

## 2014-07-11 NOTE — Progress Notes (Signed)
Deleted note in error. Note was entered 3/18 at 1802 by Zigmund Gottron, RN. Copy and pasted the note:   Wasted 35ml of Morphine from PCA in sharps. Merlene Laughter second RN to verify. Will continue to monitor pt

## 2014-07-11 NOTE — Progress Notes (Addendum)
Pt is alert and oriented X4. She states, "I will take blood, now". NP notified and came to bedside at 2305 to speak with patient and her daughter, Otila Kluver. AC also made aware. New orders received. Will carry out orders and continue to monitor.

## 2014-07-11 NOTE — Progress Notes (Signed)
CRITICAL VALUE ALERT  Critical value received:  Hgb 4.3  Date of notification:  07/11/14  Time of notification:  2993  Critical value read back:Yes.    Nurse who received alert:  Toney Sang  MD notified (1st page):  Chaney Malling, NP  Time of first page:  0430  MD notified (2nd page):  Time of second page:  Responding MD:  Awaiting response   Time MD responded:  Awaiting response

## 2014-07-11 NOTE — Progress Notes (Addendum)
[facility] Progress Note   Teresa Flowers UDJ:[id number] DOB: February 04, 1941 DOA: 07/05/2014 PCP: Wallene Dales, MD  Brief narrative: Teresa Flowers is a 74 y.o. 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   Subjective: Vomiting up clears again toay   Assessment/Plan: Principal Problem:   GI bleed/ melena/ cecal mass and 7 polyps  - EGD unrevealing- Colonoscopy shows a 4 cm mass in right colon- GI has done biopsies and are recommended a gen surgery consult  - s/p colectomy  - has had 3 blood stools since surgery- none in the past 24 hrs - continues to vomit clears- have added Phenergan to Zofran- further management per surgery- - note: staging CTs done- spiculated lung mass seen -see below    Active Problems:   Acute blood loss anemia - declining blood as she is a Jehovah's wittness - Iron levels normal but low normal- Given IV Dextran 500 cc to boost stores on 3/16 - given another 500 cc of IV Iron Dextran and Epogen on 3/17 - Hb now 4.3  Low grade fevers post op - follow for now- will allow surgical team to manage this- if they feel that fevers are not related to recent surgery, they can order further work up  Tachycardia - due to blood loss vs pain- cont hydration  Spiculated lung mass - needs outpt PET- would hold off on further procedures ie. Biopsy until Hb recovers as any complications can result in death with her inadequate blood volume    Essential hypertension - Norvasc    Hypothyroid - Levothyroxine    Type 2 diabetes mellitus, uncontrolled, with renal complications -follow on ISS and low dose lantus for now-     ARF on CKD Stage III chronic kidney disease - prerenal- Cr improved from admission (2.39) - baseline is about 1.5-1.7  Appt with PCP: requested Code Status:  full code Family Communication: spoke with husband on 3/16 Disposition Plan: home when stable - with HHPT DVT prophylaxis: SCDs Consultants:GI Procedures:EGD pending  Antibiotics: Anti-infectives    Start     Dose/Rate Route Frequency Ordered Stop   07/09/14 0600  cefoTEtan (CEFOTAN) 2 g in dextrose 5 % 50 mL IVPB     2 g 100 mL/hr over 30 Minutes Intravenous On call to O.R. 07/08/14 1533 07/09/14 0850      Objective: Filed Weights   07/05/14 1138 07/05/14 1708  Weight: 102.059 kg (225 lb) 101.5 kg (223 lb 12.3 oz)    Intake/Output Summary (Last 24 hours) at 07/11/14 0750 Last data filed at 07/11/14 0500  Gross per 24 hour  Intake 2275.83 ml  Output    600 ml  Net 1675.83 ml     Vitals Filed Vitals:   07/10/14 2200 07/11/14 0003 07/11/14 0417 07/11/14 0746  BP: 142/63 142/58 138/70   Pulse: 109 102 112   Temp:  100.3 F (37.9 C) 100.5 F (38.1 C)   TempSrc:  Oral Oral   Resp: 14 23 15 15   Height:      Weight:      SpO2: 97% 92% 97% 96%    Exam:  General:  Pt is alert, not in acute distress  HEENT: No icterus, No thrush  Cardiovascular: regular rate and rhythm, S1/S2 No murmur  Respiratory: clear to auscultation bilaterally   Abdomen: Soft, +Bowel sounds,  non distended, no guarding  MSK: No LE edema, cyanosis or clubbing  Data Reviewed: Basic Metabolic Panel:  Recent Labs Lab 07/05/14 1152 07/06/14 0625 07/07/14 0744 07/08/14 0618 07/09/14 0528 07/09/14 0905  NA 139 143 144 144 144 142  K 4.3 3.4* 4.1 4.1 3.9 3.7  CL 108 112 117* 116* 117*  --   CO2 21 24 20 25 24   --   GLUCOSE 283* 96 135* 120* 99  --   BUN 63* 54* 33* 24* 13  --   CREATININE 2.39* 1.87* 1.66* 1.78* 1.48*  --   CALCIUM 9.0 8.7 9.0 8.7 8.8  --    Liver Function Tests:  Recent Labs Lab 07/05/14 1152  AST 18  ALT 12  ALKPHOS 77  BILITOT 0.3  PROT 6.7  ALBUMIN 3.3*   No results for input(s): LIPASE, AMYLASE in the last 168 hours. No results for input(s): AMMONIA  in the last 168 hours. CBC:  Recent Labs Lab 07/07/14 0744 07/08/14 0618 07/09/14 0905 07/09/14 1315 07/10/14 0406 07/11/14 0324  WBC 6.7 6.1  --  12.1* 13.1* 15.7*  HGB 7.7* 6.9* 5.4* 7.0* 4.9* 4.3*  HCT 24.2* 21.4* 16.0* 21.8* 15.2* 13.8*  MCV 87.7 88.8  --  89.0 89.4 90.8  PLT 303 279  --  308 245 259   Cardiac Enzymes: No results for input(s): CKTOTAL, CKMB, CKMBINDEX, TROPONINI in the last 168 hours. BNP (last 3 results) No results for input(s): BNP in the last 8760 hours.  ProBNP (last 3 results) No results for input(s): PROBNP in the last 8760 hours.  CBG:  Recent Labs Lab 07/10/14 1132 07/10/14 1551 07/10/14 1755 07/10/14 2001 07/10/14 2121  GLUCAP 214* 69* 131* 196* 162*    Recent Results (from the past 240 hour(s))  Surgical pcr screen     Status: None   Collection Time: 07/08/14  8:16 PM  Result Value Ref Range Status   MRSA, PCR NEGATIVE NEGATIVE Final   Staphylococcus aureus NEGATIVE NEGATIVE Final    Comment:        The Xpert SA Assay (FDA approved for NASAL specimens in patients over 15 years of age), is one component of a comprehensive surveillance program.  Test performance has been validated by [facility] for patients greater than or equal to 80 year old. It is not intended to diagnose infection nor to guide or monitor treatment.      Studies:  Recent x-ray studies have been reviewed in detail by the Attending Physician  Scheduled Meds:  Scheduled Meds: . alvimopan  12 mg Oral BID  . insulin aspart  0-5 Units Subcutaneous QHS  . insulin aspart  0-9 Units Subcutaneous TID WC  . levothyroxine  44 mcg Intravenous Daily  . morphine   Intravenous 6 times per day  . sodium chloride  500 mL Intravenous Once  . sodium chloride  3 mL Intravenous Q12H   Continuous Infusions: . sodium chloride 100 mL/hr at 07/10/14 1358    Time spent on care of this patient: 35 min   Sylvarena, MD 07/11/2014, 7:50 AM  LOS: 6 days   [facility] Office  [phone] Pager - Text Page per [email or web address]  If 7PM-7AM, please contact night-coverage [email or web address]

## 2014-07-12 DIAGNOSIS — E038 Other specified hypothyroidism: Secondary | ICD-10-CM

## 2014-07-12 LAB — URINALYSIS, ROUTINE W REFLEX MICROSCOPIC
BILIRUBIN URINE: NEGATIVE
GLUCOSE, UA: NEGATIVE mg/dL
HGB URINE DIPSTICK: NEGATIVE
Ketones, ur: NEGATIVE mg/dL
LEUKOCYTES UA: NEGATIVE
Nitrite: NEGATIVE
PH: 5 (ref 5.0–8.0)
Protein, ur: 100 mg/dL — AB
SPECIFIC GRAVITY, URINE: 1.013 (ref 1.005–1.030)
Urobilinogen, UA: 0.2 mg/dL (ref 0.0–1.0)

## 2014-07-12 LAB — URINE MICROSCOPIC-ADD ON

## 2014-07-12 LAB — ABO/RH: ABO/RH(D): O POS

## 2014-07-12 LAB — GLUCOSE, CAPILLARY
GLUCOSE-CAPILLARY: 144 mg/dL — AB (ref 70–99)
GLUCOSE-CAPILLARY: 140 mg/dL — AB (ref 70–99)
GLUCOSE-CAPILLARY: 162 mg/dL — AB (ref 70–99)
Glucose-Capillary: 163 mg/dL — ABNORMAL HIGH (ref 70–99)

## 2014-07-12 LAB — CBC
HCT: 23.2 % — ABNORMAL LOW (ref 36.0–46.0)
HEMOGLOBIN: 7.8 g/dL — AB (ref 12.0–15.0)
MCH: 29.4 pg (ref 26.0–34.0)
MCHC: 33.6 g/dL (ref 30.0–36.0)
MCV: 87.5 fL (ref 78.0–100.0)
PLATELETS: 280 10*3/uL (ref 150–400)
RBC: 2.65 MIL/uL — AB (ref 3.87–5.11)
RDW: 14.9 % (ref 11.5–15.5)
WBC: 13.9 10*3/uL — AB (ref 4.0–10.5)

## 2014-07-12 LAB — PREPARE RBC (CROSSMATCH)

## 2014-07-12 MED ORDER — METOPROLOL TARTRATE 1 MG/ML IV SOLN
2.5000 mg | Freq: Four times a day (QID) | INTRAVENOUS | Status: DC
  Administered 2014-07-12 (×2): 2.5 mg via INTRAVENOUS
  Filled 2014-07-12 (×2): qty 5

## 2014-07-12 MED ORDER — INSULIN ASPART 100 UNIT/ML ~~LOC~~ SOLN
0.0000 [IU] | SUBCUTANEOUS | Status: DC
  Administered 2014-07-13: 2 [IU] via SUBCUTANEOUS
  Administered 2014-07-13: 1 [IU] via SUBCUTANEOUS
  Administered 2014-07-13: 2 [IU] via SUBCUTANEOUS
  Administered 2014-07-14: 3 [IU] via SUBCUTANEOUS
  Administered 2014-07-14: 1 [IU] via SUBCUTANEOUS
  Administered 2014-07-14 (×3): 2 [IU] via SUBCUTANEOUS
  Administered 2014-07-14: 1 [IU] via SUBCUTANEOUS
  Administered 2014-07-15 (×2): 2 [IU] via SUBCUTANEOUS
  Administered 2014-07-15: 3 [IU] via SUBCUTANEOUS
  Administered 2014-07-15: 2 [IU] via SUBCUTANEOUS

## 2014-07-12 MED ORDER — METOPROLOL TARTRATE 1 MG/ML IV SOLN
5.0000 mg | Freq: Four times a day (QID) | INTRAVENOUS | Status: DC
  Administered 2014-07-12 – 2014-07-14 (×7): 5 mg via INTRAVENOUS
  Filled 2014-07-12 (×11): qty 5

## 2014-07-12 NOTE — Progress Notes (Signed)
Pt had a small liquid bloody stool that was bright red and brown in color. Paged surgery PA to inform them of BM. Awaiting call back.

## 2014-07-12 NOTE — Progress Notes (Signed)
Patient ID: Teresa Flowers, female   DOB: 02-13-1941, 74 y.o.   MRN: [id number] 3 Days Post-Op  Subjective: Pt more alert and responsive this morning.  Had a small quarter sized smear of bloody stool overnight.  Has 2 episodes of emesis, but the patient states this was all phlegm.  She denies flatus.  Objective: Vital signs in last 24 hours: Temp:  [99.2 F (37.3 C)-100.3 F (37.9 C)] 99.8 F (37.7 C) (03/19 0620) Pulse Rate:  [99-140] 109 (03/19 0623) Resp:  [15-21] 15 (03/19 0623) BP: (137-182)/(56-78) 176/76 mmHg (03/19 0625) SpO2:  [90 %-96 %] 96 % (03/19 0600) Last BM Date: 07/09/14  Intake/Output from previous day: 03/18 0701 - 03/19 0700 In: 2120 [P.O.:170; I.V.:1585; Blood:365] Out: 1425 [IYMEB:5830] Intake/Output this shift:    PE: Abd: soft, absent BS, minimally tender, ND, incision c/d/i with staples Heart: tachy Lungs: CTAB  Lab Results:   Recent Labs  07/10/14 0406 07/11/14 0324  WBC 13.1* 15.7*  HGB 4.9* 4.3*  HCT 15.2* 13.8*  PLT 245 259   BMET  Recent Labs  07/09/14 0905  NA 142  K 3.7   PT/INR  Recent Labs  07/09/14 1940  LABPROT 15.3*  INR 1.19   CMP     Component Value Date/Time   NA 142 07/09/2014 0905   K 3.7 07/09/2014 0905   CL 117* 07/09/2014 0528   CO2 24 07/09/2014 0528   GLUCOSE 99 07/09/2014 0528   BUN 13 07/09/2014 0528   CREATININE 1.48* 07/09/2014 0528   CALCIUM 8.8 07/09/2014 0528   PROT 6.7 07/05/2014 1152   ALBUMIN 3.3* 07/05/2014 1152   AST 18 07/05/2014 1152   ALT 12 07/05/2014 1152   ALKPHOS 77 07/05/2014 1152   BILITOT 0.3 07/05/2014 1152   GFRNONAA 34* 07/09/2014 0528   GFRAA 39* 07/09/2014 0528   Lipase  No results found for: LIPASE     Studies/Results: No results found.  Anti-infectives: Anti-infectives    Start     Dose/Rate Route Frequency Ordered Stop   07/09/14 0600  cefoTEtan (CEFOTAN) 2 g in dextrose 5 % 50 mL IVPB     2 g 100 mL/hr over 30 Minutes Intravenous On call to O.R.  07/08/14 1533 07/09/14 0850       Assessment/Plan   POD 3, s/p ex lap with right colectomy for adenocarcinoma -cont NPO due to post op ileus. -cont entereg until she has a post op BM -mobilize and pulm toilet (PT ordered) -patient currently getting some blood.  hgb pending until after transfusion. (pt is a Jehovah's Witness, but apparently agreed overnight to receive blood) ABL anemia - see above DVT Prophylaxis -SCD/ no chemical given due to anemia   LOS: 7 days    Duvan Mousel E 07/12/2014, 8:03 AM Pager: [phone]

## 2014-07-12 NOTE — Progress Notes (Signed)
[facility] Progress Note   Teresa Flowers CBJ:[id number] DOB: [birth year] DOA: 07/05/2014 PCP: Wallene Dales, MD  Brief narrative: Teresa Flowers is a 74 y.o. 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   Subjective: Vomiting x2 last night, smear of stool with bright red bloodx2 per RN report. Patient is able to stand up with 2person assist, but report feeling weak, will keep foley in for another 24hr, then reassess. bp elevated/sinus tachycardia, start iv lopressor due to npo. hgb better after transfusion, needs serial hgb   Assessment/Plan: Principal Problem:   GI bleed/ melena/ cecal mass and 7 polyps  - EGD unrevealing- Colonoscopy shows a 4 cm mass in right colon- GI has done biopsies and are recommended a gen surgery consult ,note: staging CTs done- spiculated lung mass seen -see below  - s/p colectomy  - intermittent  blood stools/smears with bright blood per RN since surgery-  - continues to vomit clears- have added Phenergan to Zofran - post op ileus, npo, on ivf, monitor fluids status.  Active Problems:   Acute blood loss anemia - declining blood initially as she is a Jehovah's wittness - Iron levels normal but low normal- Given IV Dextran 500 cc to boost stores on 3/16 - given another 500 cc of IV Iron Dextran and Epogen on 3/17 - agreed with prbc transfusion on 3/19.  Low grade fevers post op, tmax 100 -check ua for now, encourage incentive spirometer  Sinus Tachycardia/htn: --iv lopressor started from 3/19 due to npo   Spiculated lung mass - needs outpt PET- would hold off on further procedures ie. Biopsy until Hb recovers as any complications can result in death with her inadequate blood volume    Essential hypertension - Norvasc on hold due to npo, on iv lopressor  from 3/19    Hypothyroid - Levothyroxine -check tsh, consider iv synthroid if continue not able to take po    Type 2 diabetes mellitus, uncontrolled, with renal complications -follow on ISS and low dose lantus for now-  -a1c pending    ARF on CKD Stage III chronic kidney disease - prerenal- Cr improved from admission (2.39) - baseline is about 1.5-1.7 -now stable at  Baseline from 3/16  Appt with PCP: requested Code Status: full code Family Communication: patient and female relative in room Disposition Plan: home when stable - with HHPT DVT prophylaxis: SCDs Consultants:GI/surgery/pccm Procedures: EGD/colonoscopy 3/13, 3/14 Laparoscopic-assisted ileocolectomy 3/16  Antibiotics: Anti-infectives    Start     Dose/Rate Route Frequency Ordered Stop   07/09/14 0600  cefoTEtan (CEFOTAN) 2 g in dextrose 5 % 50 mL IVPB     2 g 100 mL/hr over 30 Minutes Intravenous On call to O.R. 07/08/14 1533 07/09/14 0850      Objective: Filed Weights   07/05/14 1138 07/05/14 1708  Weight: 102.059 kg (225 lb) 101.5 kg (223 lb 12.3 oz)    Intake/Output Summary (Last 24 hours) at 07/12/14 1229 Last data filed at 07/12/14 1100  Gross per 24 hour  Intake   2200 ml  Output   1750 ml  Net    450 ml     Vitals Filed Vitals:   07/12/14 0625 07/12/14 0811 07/12/14 0856 07/12/14 1100  BP: 176/76  183/77   Pulse:   112   Temp:  99.9 F (37.7 C)  100.2 F (37.9 C) 99.4 F (37.4 C)  TempSrc:  Oral Oral Oral  Resp:   15   Height:      Weight:      SpO2:   93%     Exam:  General:  Pt is alert, not in acute distress  HEENT: No icterus, No thrush  Cardiovascular: sinus tachycardia, S1/S2 No murmur  Respiratory: clear to auscultation bilaterally   Abdomen: Soft, diminished Bowel sounds,  Postop changes, no obvious tender.  MSK: No LE edema, cyanosis or clubbing  Data Reviewed: Basic Metabolic Panel:  Recent Labs Lab 07/06/14 0625 07/07/14 0744 07/08/14 0618 07/09/14 0528  07/09/14 0905  NA 143 144 144 144 142  K 3.4* 4.1 4.1 3.9 3.7  CL 112 117* 116* 117*  --   CO2 24 20 25 24   --   GLUCOSE 96 135* 120* 99  --   BUN 54* 33* 24* 13  --   CREATININE 1.87* 1.66* 1.78* 1.48*  --   CALCIUM 8.7 9.0 8.7 8.8  --    Liver Function Tests: No results for input(s): AST, ALT, ALKPHOS, BILITOT, PROT, ALBUMIN in the last 168 hours. No results for input(s): LIPASE, AMYLASE in the last 168 hours. No results for input(s): AMMONIA in the last 168 hours. CBC:  Recent Labs Lab 07/08/14 0618 07/09/14 0905 07/09/14 1315 07/10/14 0406 07/11/14 0324 07/12/14 1145  WBC 6.1  --  12.1* 13.1* 15.7* 13.9*  HGB 6.9* 5.4* 7.0* 4.9* 4.3* 7.8*  HCT 21.4* 16.0* 21.8* 15.2* 13.8* 23.2*  MCV 88.8  --  89.0 89.4 90.8 87.5  PLT 279  --  308 245 259 280   Cardiac Enzymes: No results for input(s): CKTOTAL, CKMB, CKMBINDEX, TROPONINI in the last 168 hours. BNP (last 3 results) No results for input(s): BNP in the last 8760 hours.  ProBNP (last 3 results) No results for input(s): PROBNP in the last 8760 hours.  CBG:  Recent Labs Lab 07/11/14 2003 07/11/14 2316 07/12/14 0440 07/12/14 0809 07/12/14 1207  GLUCAP 138* 144* 140* 163* 162*    Recent Results (from the past 240 hour(s))  Surgical pcr screen     Status: None   Collection Time: 07/08/14  8:16 PM  Result Value Ref Range Status   MRSA, PCR NEGATIVE NEGATIVE Final   Staphylococcus aureus NEGATIVE NEGATIVE Final    Comment:        The Xpert SA Assay (FDA approved for NASAL specimens in patients over 51 years of age), is one component of a comprehensive surveillance program.  Test performance has been validated by [facility] for patients greater than or equal to 4 year old. It is not intended to diagnose infection nor to guide or monitor treatment.      Studies:  Recent x-ray studies have been reviewed in detail by the Attending Physician  Scheduled Meds:  Scheduled Meds: . sodium chloride    Intravenous Once  . alvimopan  12 mg Oral BID  . insulin aspart  0-5 Units Subcutaneous QHS  . insulin aspart  0-9 Units Subcutaneous TID WC  . insulin glargine  8 Units Subcutaneous QHS  . levothyroxine  44 mcg Intravenous Daily  . metoprolol  2.5 mg Intravenous 4 times per day  . sodium chloride  500 mL Intravenous Once  . sodium chloride  3 mL Intravenous Q12H   Continuous Infusions: . sodium chloride 100 mL/hr (07/11/14 1051)       Adine Heimann, MD 07/12/2014, 12:29 PM  LOS: 7  days   [facility] Office  [phone] Pager - Text Page per [email or web address]  If 7PM-7AM, please contact night-coverage [email or web address]

## 2014-07-12 NOTE — Progress Notes (Signed)
Spoke with Dr. Erlinda Hong regarding pts foley and received order to keep foley in and reassess tomorrow. Leave in for strict I's and O's. Pt is too weak to get up to [facility] d/t hemoglobin 4.1. Received 2 units PRBCs this AM. Will continue to monitor pt.

## 2014-07-12 NOTE — Progress Notes (Signed)
CRITICAL VALUE ALERT  Critical value received:  Hgb 7.8  Date of notification:  07/12/14  Time of notification: 1200  Critical value read back:Yes.    Nurse who received alert:  Maggie  MD notified (1st page):  Dr. Erlinda Hong  Time of first page:  1215  MD notified (2nd page):  Time of second page:  Responding MD:  Dr. Erlinda Hong  Time MD responded:  1230

## 2014-07-13 DIAGNOSIS — K913 Postprocedural intestinal obstruction: Secondary | ICD-10-CM

## 2014-07-13 LAB — TYPE AND SCREEN
ABO/RH(D): O POS
Antibody Screen: NEGATIVE
Unit division: 0
Unit division: 0

## 2014-07-13 LAB — GLUCOSE, CAPILLARY
GLUCOSE-CAPILLARY: 179 mg/dL — AB (ref 70–99)
Glucose-Capillary: 110 mg/dL — ABNORMAL HIGH (ref 70–99)
Glucose-Capillary: 113 mg/dL — ABNORMAL HIGH (ref 70–99)
Glucose-Capillary: 118 mg/dL — ABNORMAL HIGH (ref 70–99)
Glucose-Capillary: 124 mg/dL — ABNORMAL HIGH (ref 70–99)
Glucose-Capillary: 157 mg/dL — ABNORMAL HIGH (ref 70–99)

## 2014-07-13 LAB — CBC
HEMATOCRIT: 21.9 % — AB (ref 36.0–46.0)
Hemoglobin: 7.3 g/dL — ABNORMAL LOW (ref 12.0–15.0)
MCH: 29.4 pg (ref 26.0–34.0)
MCHC: 33.3 g/dL (ref 30.0–36.0)
MCV: 88.3 fL (ref 78.0–100.0)
Platelets: 291 10*3/uL (ref 150–400)
RBC: 2.48 MIL/uL — ABNORMAL LOW (ref 3.87–5.11)
RDW: 15.5 % (ref 11.5–15.5)
WBC: 13.6 10*3/uL — AB (ref 4.0–10.5)

## 2014-07-13 LAB — BASIC METABOLIC PANEL
Anion gap: 5 (ref 5–15)
BUN: 17 mg/dL (ref 6–23)
CHLORIDE: 114 mmol/L — AB (ref 96–112)
CO2: 23 mmol/L (ref 19–32)
Calcium: 8.7 mg/dL (ref 8.4–10.5)
Creatinine, Ser: 1.6 mg/dL — ABNORMAL HIGH (ref 0.50–1.10)
GFR calc Af Amer: 36 mL/min — ABNORMAL LOW (ref >90)
GFR calc non Af Amer: 31 mL/min — ABNORMAL LOW (ref >90)
GLUCOSE: 111 mg/dL — AB (ref 70–99)
Potassium: 3.6 mmol/L (ref 3.5–5.1)
SODIUM: 142 mmol/L (ref 135–145)

## 2014-07-13 LAB — TSH: TSH: 1.581 u[IU]/mL (ref 0.350–4.500)

## 2014-07-13 MED ORDER — FUROSEMIDE 10 MG/ML IJ SOLN
80.0000 mg | Freq: Once | INTRAMUSCULAR | Status: AC
  Administered 2014-07-13: 80 mg via INTRAVENOUS
  Filled 2014-07-13: qty 8

## 2014-07-13 MED ORDER — POTASSIUM CHLORIDE CRYS ER 20 MEQ PO TBCR
40.0000 meq | EXTENDED_RELEASE_TABLET | Freq: Once | ORAL | Status: AC
  Administered 2014-07-13: 40 meq via ORAL
  Filled 2014-07-13: qty 2

## 2014-07-13 NOTE — Progress Notes (Signed)
Patient ID: Teresa Flowers, female   DOB: 1941/01/31, 74 y.o.   MRN: [id number] 4 Days Post-Op  Subjective: Pt hungry and wants to eat.  Had several small BMs overnight that were mixed with old blood and clot.  Wanting to get up and move around.  Daughter has a lot of question  Objective: Vital signs in last 24 hours: Temp:  [98.4 F (36.9 C)-100.2 F (37.9 C)] 98.4 F (36.9 C) (03/20 0806) Pulse Rate:  [94-112] 94 (03/20 0422) Resp:  [10-17] 16 (03/20 0422) BP: (151-183)/(67-84) 157/81 mmHg (03/20 0422) SpO2:  [93 %-97 %] 95 % (03/20 0422) Last BM Date: 07/09/14  Intake/Output from previous day: 03/19 0701 - 03/20 0700 In: 2235 [I.V.:1930; Blood:305] Out: 1400 [Urine:1400] Intake/Output this shift: Total I/O In: -  Out: 500 [Urine:500]  PE: Abd: soft, some BS, NT, midline incision c/d/i with staples Heart: regular, but still some tachy  Lab Results:   Recent Labs  07/12/14 1145 07/13/14 0310  WBC 13.9* 13.6*  HGB 7.8* 7.3*  HCT 23.2* 21.9*  PLT 280 291   BMET  Recent Labs  07/13/14 0310  NA 142  K 3.6  CL 114*  CO2 23  GLUCOSE 111*  BUN 17  CREATININE 1.60*  CALCIUM 8.7   PT/INR No results for input(s): LABPROT, INR in the last 72 hours. CMP     Component Value Date/Time   NA 142 07/13/2014 0310   K 3.6 07/13/2014 0310   CL 114* 07/13/2014 0310   CO2 23 07/13/2014 0310   GLUCOSE 111* 07/13/2014 0310   BUN 17 07/13/2014 0310   CREATININE 1.60* 07/13/2014 0310   CALCIUM 8.7 07/13/2014 0310   PROT 6.7 07/05/2014 1152   ALBUMIN 3.3* 07/05/2014 1152   AST 18 07/05/2014 1152   ALT 12 07/05/2014 1152   ALKPHOS 77 07/05/2014 1152   BILITOT 0.3 07/05/2014 1152   GFRNONAA 31* 07/13/2014 0310   GFRAA 36* 07/13/2014 0310   Lipase  No results found for: LIPASE     Studies/Results: No results found.  Anti-infectives: Anti-infectives    Start     Dose/Rate Route Frequency Ordered Stop   07/09/14 0600  cefoTEtan (CEFOTAN) 2 g in dextrose 5  % 50 mL IVPB     2 g 100 mL/hr over 30 Minutes Intravenous On call to O.R. 07/08/14 1533 07/09/14 0850       Assessment/Plan   POD 4, s/p ex lap with right colectomy for adenocarcinoma -patient with some bowel function, will give clear liquids today -cont entereg until she has a post op BM -mobilize and pulm toilet (PT ordered) -having some bloody smears still.  Generally anastomotic oozing is self-limited ABL anemia - hgb 7.3. DVT Prophylaxis -SCD/ no chemical given due to anemia   LOS: 8 days    Teresa Flowers E 07/13/2014, 8:33 AM Pager: [phone]

## 2014-07-13 NOTE — Progress Notes (Signed)
Foley D\C'd. Emptied 1425 mL from foley. Removed 43mL from balloon. Pt tolerated well. Awaiting pt to void.

## 2014-07-13 NOTE — Progress Notes (Addendum)
[facility] Progress Note   [name]:[id number] DOB: Oct 16, 1940 DOA: 07/05/2014 PCP: Wallene Dales, MD  Brief narrative: Teresa Flowers is a 74 y.o. 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   Subjective: Sitting in chair , reported  No n/v in the last 12hr, feeling stronger, wanting to eat.  C/o swelling, reported take lasix at home. Per RN report still some intermittent smear of stool with  Blood. Daughter at bedside has many questions.Want to have piccline placed in to prevent frequent sticks. Explained to her will try transition patient to po meds, avoid longterm IV for now. Daughter expressed understanding.   Assessment/Plan: Principal Problem:   GI bleed/ melena/ cecal mass and 7 polyps  - EGD unrevealing- Colonoscopy shows a 4 cm mass in right colon- GI has done biopsies and  gen surgery consulted ,note: staging CTs done- spiculated lung mass seen -see below  - s/p colectomy  - intermittent  blood stools/smears with bright blood per RN since surgery-  - continues to vomit clears- have added Phenergan to Zofran - post op ileus, npo, on ivf, monitor fluids status. -no n/v last 12hrs, passing gas, 3/20 cleared by surgery to have clears, will d/c ivf  Active Problems:   Acute blood loss anemia - declining blood initially as she is a Jehovah's wittness - Iron levels normal but low normal- Given IV Dextran 500 cc to boost stores on 3/16 - given another 500 cc of IV Iron Dextran and Epogen on 3/17 - agreed with prbc transfusion on 3/19. -3/20 h/h stable, no need of transfusion  Low grade fevers post op, tmax 99.5 last 24hrs  -ua unremarkable, encourage incentive spirometer  Sinus Tachycardia/htn: --iv lopressor started from 3/19 due to npo --3/20, nsr, bp still elevated,  continue lopressor, add lasix, d/c ivf.  Spiculated lung mass - needs outpt PET- would hold off on further procedures ie. Biopsy until Hb recovers as any complications can result in death with her inadequate blood volume    Essential hypertension - Norvasc on hold due to npo, on iv lopressor from 3/19, prn hydralazine. Add lasix, replace k/mag prn.    Hypothyroid - Levothyroxine - tsh 1.5,  iv synthroid     Type 2 diabetes mellitus, uncontrolled, with renal complications -follow on ISS and low dose lantus for now-  -a1c pending    ARF on CKD Stage III chronic kidney disease - prerenal- Cr improved from admission (2.39) - baseline is about 1.5-1.7 -now stable at  Baseline from 3/16  Deconditioning: feels stronger, agree to remove foley and try to use BSC. PT eval.    Appt with PCP: requested Code Status: full code Family Communication: patient and daughter in room Disposition Plan: pending DVT prophylaxis: SCDs Consultants:GI/surgery/pccm Procedures: EGD/colonoscopy 3/13, 3/14 Laparoscopic-assisted ileocolectomy 3/16  Antibiotics: Anti-infectives    Start     Dose/Rate Route Frequency Ordered Stop   07/09/14 0600  cefoTEtan (CEFOTAN) 2 g in dextrose 5 % 50 mL IVPB     2 g 100 mL/hr over 30 Minutes Intravenous On call to O.R. 07/08/14 1533 07/09/14 0850      Objective: Filed Weights   07/05/14 1138 07/05/14 1708  Weight: 102.059 kg (225 lb) 101.5 kg (223 lb 12.3 oz)    Intake/Output Summary (Last 24 hours) at 07/13/14 8119 Last data filed  at 07/13/14 0827  Gross per 24 hour  Intake   2200 ml  Output   1650 ml  Net    550 ml     Vitals Filed Vitals:   07/12/14 2333 07/13/14 0422 07/13/14 0806 07/13/14 0810  BP: 151/67 157/81  171/111  Pulse: 97 94  93  Temp: 99.5 F (37.5 C) 99.5 F (37.5 C) 98.4 F (36.9 C)   TempSrc: Oral Oral Oral   Resp: 16 16  14   Height:      Weight:      SpO2: 95% 95%  97%    Exam:  General:  Pt is alert, not in acute  distress  HEENT: No icterus, No thrush  Cardiovascular: NRS, S1/S2 No murmur  Respiratory: clear to auscultation bilaterally   Abdomen: Soft, improved Bowel sounds, but still hypoactive, Postop changes, no obvious tender.  MSK: No LE edema, cyanosis or clubbing  Data Reviewed: Basic Metabolic Panel:  Recent Labs Lab 07/07/14 0744 07/08/14 0618 07/09/14 0528 07/09/14 0905 07/13/14 0310  NA 144 144 144 142 142  K 4.1 4.1 3.9 3.7 3.6  CL 117* 116* 117*  --  114*  CO2 20 25 24   --  23  GLUCOSE 135* 120* 99  --  111*  BUN 33* 24* 13  --  17  CREATININE 1.66* 1.78* 1.48*  --  1.60*  CALCIUM 9.0 8.7 8.8  --  8.7   Liver Function Tests: No results for input(s): AST, ALT, ALKPHOS, BILITOT, PROT, ALBUMIN in the last 168 hours. No results for input(s): LIPASE, AMYLASE in the last 168 hours. No results for input(s): AMMONIA in the last 168 hours. CBC:  Recent Labs Lab 07/09/14 1315 07/10/14 0406 07/11/14 0324 07/12/14 1145 07/13/14 0310  WBC 12.1* 13.1* 15.7* 13.9* 13.6*  HGB 7.0* 4.9* 4.3* 7.8* 7.3*  HCT 21.8* 15.2* 13.8* 23.2* 21.9*  MCV 89.0 89.4 90.8 87.5 88.3  PLT 308 245 259 280 291   Cardiac Enzymes: No results for input(s): CKTOTAL, CKMB, CKMBINDEX, TROPONINI in the last 168 hours. BNP (last 3 results) No results for input(s): BNP in the last 8760 hours.  ProBNP (last 3 results) No results for input(s): PROBNP in the last 8760 hours.  CBG:  Recent Labs Lab 07/12/14 0809 07/12/14 1207 07/12/14 2339 07/13/14 0418 07/13/14 0827  GLUCAP 163* 162* 113* 118* 110*    Recent Results (from the past 240 hour(s))  Surgical pcr screen     Status: None   Collection Time: 07/08/14  8:16 PM  Result Value Ref Range Status   MRSA, PCR NEGATIVE NEGATIVE Final   Staphylococcus aureus NEGATIVE NEGATIVE Final    Comment:        The Xpert SA Assay (FDA approved for NASAL specimens in patients over 17 years of age), is one component of a comprehensive  surveillance program.  Test performance has been validated by [facility] for patients greater than or equal to 64 year old. It is not intended to diagnose infection nor to guide or monitor treatment.      Studies:  Recent x-ray studies have been reviewed in detail by the Attending Physician  Scheduled Meds:  Scheduled Meds: . sodium chloride   Intravenous Once  . alvimopan  12 mg Oral BID  . furosemide  80 mg Intravenous Once  . insulin aspart  0-9 Units Subcutaneous Q4H  . insulin glargine  8 Units Subcutaneous QHS  . levothyroxine  44 mcg Intravenous Daily  . metoprolol  5  mg Intravenous 4 times per day  . potassium chloride  40 mEq Oral Once  . sodium chloride  500 mL Intravenous Once  . sodium chloride  3 mL Intravenous Q12H   Continuous Infusions:       Asley Baskerville, MD 07/13/2014, 9:56 AM  LOS: 8 days   [facility] Office  [phone] Pager - Text Page per [email or web address]  If 7PM-7AM, please contact night-coverage [email or web address]

## 2014-07-13 NOTE — Progress Notes (Signed)
Pt has had 3 smear of dark maroon stool with clots noted.  Pt positive for flatus.

## 2014-07-14 DIAGNOSIS — E780 Pure hypercholesterolemia: Secondary | ICD-10-CM

## 2014-07-14 LAB — BASIC METABOLIC PANEL
ANION GAP: 9 (ref 5–15)
BUN: 17 mg/dL (ref 6–23)
CHLORIDE: 107 mmol/L (ref 96–112)
CO2: 21 mmol/L (ref 19–32)
Calcium: 8.5 mg/dL (ref 8.4–10.5)
Creatinine, Ser: 1.68 mg/dL — ABNORMAL HIGH (ref 0.50–1.10)
GFR calc non Af Amer: 29 mL/min — ABNORMAL LOW (ref >90)
GFR, EST AFRICAN AMERICAN: 33 mL/min — AB (ref >90)
Glucose, Bld: 140 mg/dL — ABNORMAL HIGH (ref 70–99)
Potassium: 3.7 mmol/L (ref 3.5–5.1)
SODIUM: 137 mmol/L (ref 135–145)

## 2014-07-14 LAB — CBC
HCT: 23 % — ABNORMAL LOW (ref 36.0–46.0)
Hemoglobin: 7.7 g/dL — ABNORMAL LOW (ref 12.0–15.0)
MCH: 29.7 pg (ref 26.0–34.0)
MCHC: 33.5 g/dL (ref 30.0–36.0)
MCV: 88.8 fL (ref 78.0–100.0)
PLATELETS: 225 10*3/uL (ref 150–400)
RBC: 2.59 MIL/uL — ABNORMAL LOW (ref 3.87–5.11)
RDW: 15.2 % (ref 11.5–15.5)
WBC: 10.8 10*3/uL — AB (ref 4.0–10.5)

## 2014-07-14 LAB — GLUCOSE, CAPILLARY
GLUCOSE-CAPILLARY: 150 mg/dL — AB (ref 70–99)
GLUCOSE-CAPILLARY: 153 mg/dL — AB (ref 70–99)
Glucose-Capillary: 110 mg/dL — ABNORMAL HIGH (ref 70–99)
Glucose-Capillary: 149 mg/dL — ABNORMAL HIGH (ref 70–99)
Glucose-Capillary: 139 mg/dL — ABNORMAL HIGH (ref 70–99)
Glucose-Capillary: 166 mg/dL — ABNORMAL HIGH (ref 70–99)
Glucose-Capillary: 213 mg/dL — ABNORMAL HIGH (ref 70–99)
Glucose-Capillary: 159 mg/dL — ABNORMAL HIGH (ref 70–99)

## 2014-07-14 LAB — MAGNESIUM: Magnesium: 1.6 mg/dL (ref 1.5–2.5)

## 2014-07-14 MED ORDER — ISOSORBIDE MONONITRATE ER 30 MG PO TB24
30.0000 mg | ORAL_TABLET | Freq: Every day | ORAL | Status: DC
  Administered 2014-07-14 – 2014-07-17 (×4): 30 mg via ORAL
  Filled 2014-07-14 (×5): qty 1

## 2014-07-14 MED ORDER — CARVEDILOL 25 MG PO TABS
25.0000 mg | ORAL_TABLET | Freq: Two times a day (BID) | ORAL | Status: DC
  Administered 2014-07-14 – 2014-07-17 (×6): 25 mg via ORAL
  Filled 2014-07-14 (×10): qty 1

## 2014-07-14 MED ORDER — LEVOTHYROXINE SODIUM 88 MCG PO TABS
88.0000 ug | ORAL_TABLET | Freq: Every day | ORAL | Status: DC
  Administered 2014-07-15 – 2014-07-17 (×3): 88 ug via ORAL
  Filled 2014-07-14 (×5): qty 1

## 2014-07-14 MED ORDER — HYDRALAZINE HCL 20 MG/ML IJ SOLN: 10.0000 mg | Freq: Three times a day (TID) | INTRAMUSCULAR | Status: DC | PRN

## 2014-07-14 MED ORDER — MAGNESIUM SULFATE 2 GM/50ML IV SOLN
2.0000 g | Freq: Once | INTRAVENOUS | Status: AC
  Administered 2014-07-14: 2 g via INTRAVENOUS
  Filled 2014-07-14: qty 50

## 2014-07-14 MED ORDER — METOPROLOL TARTRATE 1 MG/ML IV SOLN
2.5000 mg | Freq: Four times a day (QID) | INTRAVENOUS | Status: DC | PRN
  Filled 2014-07-14: qty 5

## 2014-07-14 MED ORDER — POTASSIUM CHLORIDE CRYS ER 20 MEQ PO TBCR
40.0000 meq | EXTENDED_RELEASE_TABLET | Freq: Once | ORAL | Status: AC
  Administered 2014-07-14: 40 meq via ORAL
  Filled 2014-07-14 (×2): qty 2

## 2014-07-14 MED ORDER — FUROSEMIDE 40 MG PO TABS
40.0000 mg | ORAL_TABLET | Freq: Every day | ORAL | Status: DC
  Administered 2014-07-14 – 2014-07-15 (×2): 40 mg via ORAL
  Filled 2014-07-14 (×2): qty 1

## 2014-07-14 MED ORDER — FUROSEMIDE 40 MG PO TABS: 40.0000 mg | ORAL_TABLET | Freq: Once | ORAL | Status: DC

## 2014-07-14 NOTE — Progress Notes (Signed)
Patient ID: Teresa Flowers, female   DOB: December 07, 1940, 74 y.o.   MRN: [id number] 5 Days Post-Op  Subjective: Pt looks great this morning.  No more bloody BMs per patient.  Tolerating clear liquids well with no nausea.  Objective: Vital signs in last 24 hours: Temp:  [97.2 F (36.2 C)-98.7 F (37.1 C)] 97.2 F (36.2 C) (03/21 0415) Pulse Rate:  [79-100] 87 (03/21 0830) Resp:  [12-16] 14 (03/21 0830) BP: (149-197)/(68-91) 168/73 mmHg (03/21 0908) SpO2:  [94 %-98 %] 98 % (03/21 0830) Last BM Date: 07/09/14  Intake/Output from previous day: 03/20 0701 - 03/21 0700 In: 300 [I.V.:300] Out: 3300 [Urine:3300] Intake/Output this shift: Total I/O In: 120 [P.O.:120] Out: -   PE: Abd: soft, +BS, ND, obese, incision c/d/i.  Removed honeycomb dressing.  No staples.  Running subQ stitch  Lab Results:   Recent Labs  07/13/14 0310 07/14/14 0340  WBC 13.6* 10.8*  HGB 7.3* 7.7*  HCT 21.9* 23.0*  PLT 291 225   BMET  Recent Labs  07/13/14 0310 07/14/14 0340  NA 142 137  K 3.6 3.7  CL 114* 107  CO2 23 21  GLUCOSE 111* 140*  BUN 17 17  CREATININE 1.60* 1.68*  CALCIUM 8.7 8.5   PT/INR No results for input(s): LABPROT, INR in the last 72 hours. CMP     Component Value Date/Time   NA 137 07/14/2014 0340   K 3.7 07/14/2014 0340   CL 107 07/14/2014 0340   CO2 21 07/14/2014 0340   GLUCOSE 140* 07/14/2014 0340   BUN 17 07/14/2014 0340   CREATININE 1.68* 07/14/2014 0340   CALCIUM 8.5 07/14/2014 0340   PROT 6.7 07/05/2014 1152   ALBUMIN 3.3* 07/05/2014 1152   AST 18 07/05/2014 1152   ALT 12 07/05/2014 1152   ALKPHOS 77 07/05/2014 1152   BILITOT 0.3 07/05/2014 1152   GFRNONAA 29* 07/14/2014 0340   GFRAA 33* 07/14/2014 0340   Lipase  No results found for: LIPASE     Studies/Results: No results found.  Anti-infectives: Anti-infectives    Start     Dose/Rate Route Frequency Ordered Stop   07/09/14 0600  cefoTEtan (CEFOTAN) 2 g in dextrose 5 % 50 mL IVPB     2  g 100 mL/hr over 30 Minutes Intravenous On call to O.R. 07/08/14 1533 07/09/14 0850       Assessment/Plan  POD 5, s/p ex lap with right colectomy for adenocarcinoma -advance to soft diet -mobilize and pulm toilet (PT ordered) -having some bloody smears still. Generally anastomotic oozing is self-limited -stable for transfer to floor from our standpoint ABL anemia - hgb 7.7, stable DVT Prophylaxis -SCD/ no chemical given due to anemia    LOS: 9 days    Palyn Scrima E 07/14/2014, 9:30 AM Pager: [phone]

## 2014-07-14 NOTE — Progress Notes (Signed)
[facility] Progress Note   Teresa Flowers UXL:[id number] DOB: 29-Nov-1940 DOA: 07/05/2014 PCP: Teresa Dales, MD  Brief narrative: Teresa Flowers Xxxbyndloss is a 74 y.o. 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   Subjective: Sitting in chair , reported  No n/v in the last two days, passing gas, smear of stool with less blood, feeling stronger, want to advance diet. Report less edema after lasix.  husband in room  Assessment/Plan: Principal Problem:   s/p ex lap with right colectomy for adenocarcinoma Presented with GI bleed/ melena/ cecal mass and 7 polyps  - EGD unrevealing- Colonoscopy shows a 4 cm mass in right colon-  GI has done biopsies/ gen surgery right colectomy , -note: staging CTs done- spiculated lung mass seen -see below  - post op ileus, resolving, started diet and advanced as tolerated. -improving , to med/tele floor.  Active Problems:   Acute blood loss anemia - declining blood initially as she is a Jehovah's wittness - received IV Dextran 500 cc to boost stores on 3/16 - another 500 cc of IV Iron Dextran and Epogen on 3/17 - agreed with prbc transfusion on 3/19. -3/20 h/h stable, no need of further transfusion  Low grade fevers post op, tmax 99.7 last 24hrs  -ua unremarkable, encourage incentive spirometer  HTN: --iv lopressor started from 3/19 due to npo --3/20, nsr, bp still elevated, continue lopressor, add lasix, d/c ivf. -3/21 able to advance diet, change med to po, start coreg 25mg  bid/imdur 30mg  qd/lasix 40mg  qd. D/c norvasc due to edema. Consider restart acei.  Spiculated lung mass - needs outpt PET/biopsy/oncology follow up       Hypothyroid - Levothyroxine - tsh 1.5,  iv synthroid due to npo -restart oral meds on 3/21    Type 2 diabetes mellitus,  uncontrolled, with renal complications -follow on ISS and low dose lantus for now-  -a1c 7.3    ARF on CKD Stage III chronic kidney disease - prerenal- Cr improved from admission (2.39) - baseline is about 1.5-1.7 -now stable at  Baseline from 3/16  Deconditioning: feels stronger, agree to remove foley and try to use BSC. PT eval. placement   Appt with PCP: requested Code Status: full code Family Communication: patient and husband in room Disposition Plan: pending, likely need placement DVT prophylaxis: SCDs Consultants:GI/surgery/pccm Procedures: EGD/colonoscopy 3/13, 3/14 Laparoscopic-assisted ileocolectomy 3/16 prbc transfusion 3/19  Antibiotics: Anti-infectives    Start     Dose/Rate Route Frequency Ordered Stop   07/09/14 0600  cefoTEtan (CEFOTAN) 2 g in dextrose 5 % 50 mL IVPB     2 g 100 mL/hr over 30 Minutes Intravenous On call to O.R. 07/08/14 1533 07/09/14 0850      Objective: Filed Weights   07/05/14 1138 07/05/14 1708  Weight: 102.059 kg (225 lb) 101.5 kg (223 lb 12.3 oz)    Intake/Output Summary (Last 24 hours) at 07/14/14 1803 Last data filed at 07/14/14 1500  Gross per 24 hour  Intake    410 ml  Output   1425 ml  Net  -1015 ml     Vitals Filed Vitals:   07/14/14 1030 07/14/14 1045 07/14/14 1130 07/14/14 1544  BP: 159/143 167/84 170/75 152/63  Pulse: 92 106 100 92  Temp:   98.9 F (37.2 C) 98 F (36.7 C)  TempSrc:   Oral  Oral  Resp: 14 19 15 17   Height:      Weight:      SpO2: 100% 100% 100% 98%    Exam:  General:  Pt is alert, not in acute distress  HEENT: No icterus, No thrush  Cardiovascular: NRS, S1/S2 No murmur  Respiratory: clear to auscultation bilaterally   Abdomen: Soft, improved Bowel sounds,  Postop changes, no obvious tender.  MSK: No LE edema, cyanosis or clubbing  Data Reviewed: Basic Metabolic Panel:  Recent Labs Lab 07/08/14 0618 07/09/14 0528 07/09/14 0905 07/13/14 0310 07/14/14 0340  NA 144 144 142 142  137  K 4.1 3.9 3.7 3.6 3.7  CL 116* 117*  --  114* 107  CO2 25 24  --  23 21  GLUCOSE 120* 99  --  111* 140*  BUN 24* 13  --  17 17  CREATININE 1.78* 1.48*  --  1.60* 1.68*  CALCIUM 8.7 8.8  --  8.7 8.5  MG  --   --   --   --  1.6   Liver Function Tests: No results for input(s): AST, ALT, ALKPHOS, BILITOT, PROT, ALBUMIN in the last 168 hours. No results for input(s): LIPASE, AMYLASE in the last 168 hours. No results for input(s): AMMONIA in the last 168 hours. CBC:  Recent Labs Lab 07/10/14 0406 07/11/14 0324 07/12/14 1145 07/13/14 0310 07/14/14 0340  WBC 13.1* 15.7* 13.9* 13.6* 10.8*  HGB 4.9* 4.3* 7.8* 7.3* 7.7*  HCT 15.2* 13.8* 23.2* 21.9* 23.0*  MCV 89.4 90.8 87.5 88.3 88.8  PLT 245 259 280 291 225   Cardiac Enzymes: No results for input(s): CKTOTAL, CKMB, CKMBINDEX, TROPONINI in the last 168 hours. BNP (last 3 results) No results for input(s): BNP in the last 8760 hours.  ProBNP (last 3 results) No results for input(s): PROBNP in the last 8760 hours.  CBG:  Recent Labs Lab 07/14/14 0005 07/14/14 0414 07/14/14 0746 07/14/14 1135 07/14/14 1537  GLUCAP 153* 149* 139* 166* 213*    Recent Results (from the past 240 hour(s))  Surgical pcr screen     Status: None   Collection Time: 07/08/14  8:16 PM  Result Value Ref Range Status   MRSA, PCR NEGATIVE NEGATIVE Final   Staphylococcus aureus NEGATIVE NEGATIVE Final    Comment:        The Xpert SA Assay (FDA approved for NASAL specimens in patients over 71 years of age), is one component of a comprehensive surveillance program.  Test performance has been validated by [facility] for patients greater than or equal to 22 year old. It is not intended to diagnose infection nor to guide or monitor treatment.      Studies:  Recent x-ray studies have been reviewed in detail by the Attending Physician  Scheduled Meds:  Scheduled Meds: . sodium chloride   Intravenous Once  . alvimopan  12 mg Oral BID  .  carvedilol  25 mg Oral BID WC  . furosemide  40 mg Oral Daily  . insulin aspart  0-9 Units Subcutaneous Q4H  . insulin glargine  8 Units Subcutaneous QHS  . isosorbide mononitrate  30 mg Oral Daily  . [START ON 07/15/2014] levothyroxine  88 mcg Oral QAC breakfast  . sodium chloride  500 mL Intravenous Once  . sodium chloride  3 mL Intravenous Q12H   Continuous Infusions:     Teresa Hobbins, MD/PhD 07/14/2014, 6:03 PM  LOS: 9 days   [facility] Office  [phone] Pager - Text Page  per [email or web address]  If 7PM-7AM, please contact night-coverage [email or web address]

## 2014-07-14 NOTE — Progress Notes (Signed)
Medicare Important Message given? YES (If response is "NO", the following Medicare IM given date fields will be blank) Date Medicare IM given:07/14/2014 Medicare IM given by: Whitman Hero

## 2014-07-14 NOTE — Progress Notes (Signed)
PT Cancellation Note  Patient Details Name: Teresa Flowers MRN: [id number] DOB: October 06, 1940   Cancelled Treatment:    Reason Eval/Treat Not Completed: Patient not medically ready.  Pt just returned to bed and declined mobility at this time.  Will f/u tomorrow.     Malynn Lucy, [place] Papas 07/14/2014, 3:41 PM

## 2014-07-15 LAB — CBC
HEMATOCRIT: 22.5 % — AB (ref 36.0–46.0)
HEMOGLOBIN: 7.4 g/dL — AB (ref 12.0–15.0)
MCH: 29.6 pg (ref 26.0–34.0)
MCHC: 32.9 g/dL (ref 30.0–36.0)
MCV: 90 fL (ref 78.0–100.0)
Platelets: 273 10*3/uL (ref 150–400)
RBC: 2.5 MIL/uL — AB (ref 3.87–5.11)
RDW: 15.7 % — ABNORMAL HIGH (ref 11.5–15.5)
WBC: 9.8 10*3/uL (ref 4.0–10.5)

## 2014-07-15 LAB — GLUCOSE, CAPILLARY
Glucose-Capillary: 153 mg/dL — ABNORMAL HIGH (ref 70–99)
Glucose-Capillary: 119 mg/dL — ABNORMAL HIGH (ref 70–99)
Glucose-Capillary: 102 mg/dL — ABNORMAL HIGH (ref 70–99)
Glucose-Capillary: 162 mg/dL — ABNORMAL HIGH (ref 70–99)
Glucose-Capillary: 154 mg/dL — ABNORMAL HIGH (ref 70–99)
Glucose-Capillary: 215 mg/dL — ABNORMAL HIGH (ref 70–99)

## 2014-07-15 LAB — BASIC METABOLIC PANEL
ANION GAP: 7 (ref 5–15)
BUN: 20 mg/dL (ref 6–23)
CHLORIDE: 105 mmol/L (ref 96–112)
CO2: 24 mmol/L (ref 19–32)
Calcium: 8.4 mg/dL (ref 8.4–10.5)
Creatinine, Ser: 1.93 mg/dL — ABNORMAL HIGH (ref 0.50–1.10)
GFR calc non Af Amer: 24 mL/min — ABNORMAL LOW (ref >90)
GFR, EST AFRICAN AMERICAN: 28 mL/min — AB (ref >90)
Glucose, Bld: 73 mg/dL (ref 70–99)
POTASSIUM: 4.7 mmol/L (ref 3.5–5.1)
SODIUM: 136 mmol/L (ref 135–145)

## 2014-07-15 LAB — MAGNESIUM: Magnesium: 2.1 mg/dL (ref 1.5–2.5)

## 2014-07-15 LAB — HEMOGLOBIN A1C
Hgb A1c MFr Bld: 7.3 % — ABNORMAL HIGH (ref 4.8–5.6)
Mean Plasma Glucose: 163 mg/dL

## 2014-07-15 MED ORDER — INSULIN GLARGINE 100 UNIT/ML ~~LOC~~ SOLN
5.0000 [IU] | Freq: Every day | SUBCUTANEOUS | Status: DC
  Administered 2014-07-15 – 2014-07-16 (×2): 5 [IU] via SUBCUTANEOUS
  Filled 2014-07-15 (×3): qty 0.05

## 2014-07-15 MED ORDER — POLYETHYLENE GLYCOL 3350 17 G PO PACK
17.0000 g | PACK | Freq: Every day | ORAL | Status: DC
  Administered 2014-07-15 – 2014-07-17 (×3): 17 g via ORAL
  Filled 2014-07-15 (×3): qty 1

## 2014-07-15 MED ORDER — DOCUSATE SODIUM 100 MG PO CAPS
100.0000 mg | ORAL_CAPSULE | Freq: Two times a day (BID) | ORAL | Status: DC
  Administered 2014-07-15 – 2014-07-17 (×5): 100 mg via ORAL
  Filled 2014-07-15 (×5): qty 1

## 2014-07-15 NOTE — Progress Notes (Signed)
Physical Therapy Treatment Patient Details Name: Teresa Flowers MRN: [id number] DOB: 12/21/1940 Today's Date: 07/15/2014    History of Present Illness Pt admit with GIB.      PT Comments    Pt progressing with mobility and continues to be very motivated to return to independence. Pt slightly staggering with ambulation; may require cane vs RW upon D/C home.   Follow Up Recommendations  Home health PT;Supervision/Assistance - 24 hour     Equipment Recommendations  Cane (cane vs RW pending )    Recommendations for Other Services       Precautions / Restrictions Precautions Precautions: Fall Restrictions Weight Bearing Restrictions: No    Mobility  Bed Mobility Overal bed mobility: Needs Assistance Bed Mobility: Supine to Sit     Supine to sit: Supervision     General bed mobility comments: pt able to sit EOB with use of handrails and cues  Transfers Overall transfer level: Needs assistance Equipment used: 1 person hand held assist Transfers: Sit to/from Stand Sit to Stand: Min guard         General transfer comment: handheld (A) to steady; min guard initially; no LOB noted sit intial sit to stand  Ambulation/Gait Ambulation/Gait assistance: Min guard Ambulation Distance (Feet): 220 Feet Assistive device: 1 person hand held assist Gait Pattern/deviations: Step-through pattern;Decreased stride length;Staggering left Gait velocity: decr/guarded Gait velocity interpretation: Below normal speed for age/gender General Gait Details: pt ambulating with handheld (A) for min guard due to generalized weakness; may benefit from RW vs cane upon D?c home; pt denied any dizziness with directional changes    Stairs            Wheelchair Mobility    Modified Rankin (Stroke Patients Only)       Balance Overall balance assessment: Needs assistance Sitting-balance support: Feet supported;No upper extremity supported Sitting balance-Leahy Scale: Good      Standing balance support: During functional activity;No upper extremity supported;Single extremity supported Standing balance-Leahy Scale: Fair Standing balance comment: briefly standing without HHA                     Cognition Arousal/Alertness: Awake/alert Behavior During Therapy: WFL for tasks assessed/performed Overall Cognitive Status: Within Functional Limits for tasks assessed                      Exercises      General Comments        Pertinent Vitals/Pain Pain Assessment: No/denies pain    Home Living                      Prior Function            PT Goals (current goals can now be found in the care plan section) Acute Rehab PT Goals Patient Stated Goal: pt very eager to ambulate  PT Goal Formulation: With patient Time For Goal Achievement: 07/13/14 Potential to Achieve Goals: Good Progress towards PT goals: Progressing toward goals    Frequency  Min 3X/week    PT Plan Current plan remains appropriate    Co-evaluation             End of Session Equipment Utilized During Treatment: Gait belt Activity Tolerance: Patient tolerated treatment well Patient left: in bed;with call bell/phone within reach;with family/visitor present     Time: 0809-0820 PT Time Calculation (min) (ACUTE ONLY): 11 min  Charges:  $Gait Training: 8-22 mins  G CodesGustavus Bryant, [place] 07/15/2014, 8:40 AM

## 2014-07-15 NOTE — Progress Notes (Signed)
Patient has ambulated in room and been up to chair majority of day. Tolerated well. No complaints voiced at this time.

## 2014-07-15 NOTE — Progress Notes (Signed)
[facility] Progress Note  6 Days Post-Op  Subjective: Pt feels good.  Not much abdominal pain or N/V, tolerating soft diet well.  Ambulating some OOB.  Urinating well.  No BM yet.  No complaints.  Husband at bedside.  Objective: Vital signs in last 24 hours: Temp:  [98 F (36.7 C)-100 F (37.8 C)] 99.3 F (37.4 C) (03/22 0517) Pulse Rate:  [76-106] 76 (03/22 0517) Resp:  [14-20] 18 (03/22 0517) BP: (146-182)/(61-143) 146/61 mmHg (03/22 0517) SpO2:  [98 %-100 %] 99 % (03/22 0517) Weight:  [108.5 kg (239 lb 3.2 oz)] 108.5 kg (239 lb 3.2 oz) (03/22 0517) Last BM Date: 07/09/14  Intake/Output from previous day: 03/21 0701 - 03/22 0700 In: 530 [P.O.:480; IV Piggyback:50] Out: 675 [Urine:675] Intake/Output this shift: Total I/O In: -  Out: 125 [Urine:125]  PE: Gen:  Alert, NAD, pleasant Abd: Soft, NT/ND, +BS, no HSM, incisions site C/D/I with dermabond and subq dissolvable sutures in place   Lab Results:   Recent Labs  07/14/14 0340 07/15/14 0535  WBC 10.8* 9.8  HGB 7.7* 7.4*  HCT 23.0* 22.5*  PLT 225 273   BMET  Recent Labs  07/14/14 0340 07/15/14 0535  NA 137 136  K 3.7 4.7  CL 107 105  CO2 21 24  GLUCOSE 140* 73  BUN 17 20  CREATININE 1.68* 1.93*  CALCIUM 8.5 8.4   PT/INR No results for input(s): LABPROT, INR in the last 72 hours. CMP     Component Value Date/Time   NA 136 07/15/2014 0535   K 4.7 07/15/2014 0535   CL 105 07/15/2014 0535   CO2 24 07/15/2014 0535   GLUCOSE 73 07/15/2014 0535   BUN 20 07/15/2014 0535   CREATININE 1.93* 07/15/2014 0535   CALCIUM 8.4 07/15/2014 0535   PROT 6.7 07/05/2014 1152   ALBUMIN 3.3* 07/05/2014 1152   AST 18 07/05/2014 1152   ALT 12 07/05/2014 1152   ALKPHOS 77 07/05/2014 1152   BILITOT 0.3 07/05/2014 1152   GFRNONAA 24* 07/15/2014 0535   GFRAA 28* 07/15/2014 0535   Lipase  No results found for: LIPASE     Studies/Results: No results found.  Anti-infectives: Anti-infectives    Start     Dose/Rate Route Frequency Ordered Stop   07/09/14 0600  cefoTEtan (CEFOTAN) 2 g in dextrose 5 % 50 mL IVPB     2 g 100 mL/hr over 30 Minutes Intravenous On call to O.R. 07/08/14 1533 07/09/14 0850       Assessment/Plan POD #6, s/p Laparoscopic assisted right ileocolectomy for invasive adenocarcinoma -Tolerating soft diet -Mobilize and pulm toilet (PT ordered) -On floor -Start bowel regimen - dulcolax/miralax ABL anemia -Pt is a Jehovah's Witness, but apparently agreed to receive blood on 07/12/14 -Required 2 units pRBC secondary to symptomatic anemia and Hgb 4.3 -Hgb now 7.4 and seems to be stabilizing DVT Prophylaxis -SCD/ no chemical given due to anemia  AKI -Cr. Is now 1.93 Disp -Home when medically stable and having BM's -Has dermabond - no staples or sutures need to be removed -Follow up appointment with Dr. Ninfa Linden in 2-3 weeks    LOS: 10 days    [place], [facility] 07/15/2014, 8:54 AM Pager: [phone]

## 2014-07-15 NOTE — Progress Notes (Signed)
Patient having some blood from rectum. No BM noted. Dr. Erlinda Hong paged to notify.

## 2014-07-15 NOTE — Progress Notes (Signed)
[facility] Progress Note   Teresa Flowers CNO:[id number] DOB: [birth year] DOA: 07/05/2014 PCP: Wallene Dales, MD  Brief narrative: Blaike Newburn Xxxbyndloss is a 74 y.o. 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   Subjective: No bm, passing gas, tolerating diet advancement, no n/v.  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 -improving , to med/tele floor.  Active Problems:   Acute blood loss anemia - declining blood initially as she is a Jehovah's wittness - received IV Dextran 500 cc to boost stores on 3/16 - another 500 cc of IV Iron Dextran and Epogen on 3/17 - agreed with prbc transfusion on 3/19. -3/20 h/h stable, no need of further transfusion  Low grade fevers post op, tmax 100 last 24hrs  -ua unremarkable, encourage incentive spirometer,  -fever from cancer?  HTN: --iv lopressor started from 3/19 due to npo --3/20, nsr, bp still elevated, continue lopressor, add lasix, d/c ivf. -3/21 able to advance diet, change med to po, start coreg 25mg  bid/imdur 30mg  qd/lasix 40mg  qd. D/c norvasc due to edema. Consider restart acei. -bp improved on coreg/imdur, consider restart lisinopril when renal function stabilized.  ARF on CKD Stage III chronic kidney disease - prerenal- Cr improved from admission (2.39) - baseline is about 1.5-1.7 -worsening cr on 3/22, d/c  lasix  Spiculated lung mass - needs outpt PET/biopsy/oncology follow up      Hypothyroid - Levothyroxine - tsh 1.5,  iv synthroid due to npo -restart oral meds on 3/21    Type 2 diabetes mellitus, uncontrolled, with renal complications -follow on ISS and low dose lantus for now-  -a1c 7.3    Deconditioning:  PT eval. placement   Appt with PCP: requested Code Status: full code Family Communication: patient Disposition Plan: pending, likely need placement DVT prophylaxis: SCDs Consultants:GI/surgery/pccm Procedures: EGD/colonoscopy 3/13, 3/14 Laparoscopic-assisted ileocolectomy 3/16 prbc transfusion 3/19  Antibiotics: Anti-infectives    Start     Dose/Rate Route Frequency Ordered Stop   07/09/14 0600  cefoTEtan (CEFOTAN) 2 g in dextrose 5 % 50 mL IVPB     2 g 100 mL/hr over 30 Minutes Intravenous On call to O.R. 07/08/14 1533 07/09/14 0850      Objective: Filed Weights   07/05/14 1138 07/05/14 1708 07/15/14 0517  Weight: 102.059 kg (225 lb) 101.5 kg (223 lb 12.3 oz) 108.5 kg (239 lb 3.2 oz)    Intake/Output Summary (Last 24 hours) at 07/15/14 1512 Last data filed at 07/15/14 1051  Gross per 24 hour  Intake    360 ml  Output    500 ml  Net   -140 ml     Vitals Filed Vitals:   07/14/14 1921 07/14/14 1955 07/15/14 0517 07/15/14 1039  BP:  182/72 146/61 144/58  Pulse:  95 76 79  Temp:  100 F (37.8 C) 99.3 F (37.4 C) 96.7 F (35.9 C)  TempSrc: Oral Oral Oral Oral  Resp:  20 18 16   Height:      Weight:   108.5 kg (239 lb 3.2 oz)   SpO2:  99% 99% 100%    Exam:  General:  Pt is alert, not in acute distress  HEENT: No icterus, No thrush  Cardiovascular: NRS, S1/S2 No murmur  Respiratory: clear to auscultation bilaterally   Abdomen: Soft, improved Bowel sounds,  Postop changes, no obvious tender.  MSK: No LE edema, cyanosis or clubbing  Data Reviewed: Basic Metabolic Panel:  Recent Labs Lab 07/09/14 0528 07/09/14 0905 07/13/14 0310  07/14/14 0340 07/15/14 0535  NA 144 142 142 137 136  K 3.9 3.7 3.6 3.7 4.7  CL 117*  --  114* 107 105  CO2 24  --  23 21 24   GLUCOSE 99  --  111* 140* 73  BUN 13  --  17 17 20   CREATININE 1.48*  --  1.60* 1.68* 1.93*  CALCIUM 8.8  --  8.7 8.5 8.4  MG  --   --   --  1.6 2.1   Liver Function Tests: No results for input(s): AST, ALT, ALKPHOS, BILITOT, PROT, ALBUMIN in the last 168 hours. No results for input(s): LIPASE, AMYLASE in the last 168 hours. No results for input(s): AMMONIA in the last 168 hours. CBC:  Recent Labs Lab 07/11/14 0324 07/12/14 1145 07/13/14 0310 07/14/14 0340 07/15/14 0535  WBC 15.7* 13.9* 13.6* 10.8* 9.8  HGB 4.3* 7.8* 7.3* 7.7* 7.4*  HCT 13.8* 23.2* 21.9* 23.0* 22.5*  MCV 90.8 87.5 88.3 88.8 90.0  PLT 259 280 291 225 273   Cardiac Enzymes: No results for input(s): CKTOTAL, CKMB, CKMBINDEX, TROPONINI in the last 168 hours. BNP (last 3 results) No results for input(s): BNP in the last 8760 hours.  ProBNP (last 3 results) No results for input(s): PROBNP in the last 8760 hours.  CBG:  Recent Labs Lab 07/14/14 2126 07/15/14 0106 07/15/14 0409 07/15/14 0743 07/15/14 1205  GLUCAP 159* 153* 119* 102* 162*    Recent Results (from the past 240 hour(s))  Surgical pcr screen     Status: None   Collection Time: 07/08/14  8:16 PM  Result Value Ref Range Status   MRSA, PCR NEGATIVE NEGATIVE Final   Staphylococcus aureus NEGATIVE NEGATIVE Final    Comment:        The Xpert SA Assay (FDA approved for NASAL specimens in patients over 1 years of age), is one component of a comprehensive surveillance program.  Test performance has been validated by [facility] for patients greater than or equal to 47 year old. It is not intended to diagnose infection nor to guide or monitor treatment.      Studies:  Recent x-ray studies have been reviewed in detail by the Attending Physician  Scheduled Meds:  Scheduled Meds: . sodium chloride    Intravenous Once  . alvimopan  12 mg Oral BID  . carvedilol  25 mg Oral BID WC  . docusate sodium  100 mg Oral BID  . insulin aspart  0-9 Units Subcutaneous Q4H  . insulin glargine  5 Units Subcutaneous QHS  . isosorbide mononitrate  30 mg Oral Daily  . levothyroxine  88 mcg Oral QAC breakfast  . polyethylene glycol  17 g Oral Daily  . sodium chloride  500 mL Intravenous Once  . sodium chloride  3 mL Intravenous Q12H   Continuous Infusions:     Laurisa Sahakian, MD/PhD 07/15/2014, 3:12 PM  LOS: 10 days   [facility] Office  [phone] Pager - Text Page per [email or web address]  If 7PM-7AM, please contact night-coverage [email or web address]

## 2014-07-15 NOTE — Progress Notes (Signed)
Patient up to chair. Tolerating well. No complaints voiced at this time.

## 2014-07-15 NOTE — Discharge Instructions (Signed)
[facility]      [facility] [phone]  OPEN ABDOMINAL SURGERY: POST OP INSTRUCTIONS  Always review your discharge instruction sheet given to you by the facility where your surgery was performed.  IF YOU HAVE DISABILITY OR FAMILY LEAVE FORMS, YOU MUST BRING THEM TO THE OFFICE FOR PROCESSING.  PLEASE DO NOT GIVE THEM TO YOUR DOCTOR.  1. A prescription for pain medication may be given to you upon discharge.  Take your pain medication as prescribed, if needed.  If narcotic pain medicine is not needed, then you may take acetaminophen (Tylenol) or ibuprofen (Advil) as needed. 2. Take your usually prescribed medications unless otherwise directed. 3. If you need a refill on your pain medication, please contact your pharmacy. They will contact our office to request authorization.  Prescriptions will not be filled after 5pm or on week-ends. 4. You should follow a light diet the first few days after arrival home, such as soup and crackers, pudding, etc.unless your doctor has advised otherwise. A high-fiber, low fat diet can be resumed as tolerated.   Be sure to include lots of fluids daily. Most patients will experience some swelling and bruising on the chest and neck area.  Ice packs will help.  Swelling and bruising can take several days to resolve 5. Most patients will experience some swelling and bruising in the area of the incision. Ice pack will help. Swelling and bruising can take several days to resolve..  6. It is common to experience some constipation if taking pain medication after surgery.  Increasing fluid intake and taking a stool softener will usually help or prevent this problem from occurring.  A mild laxative (Milk of Magnesia or Miralax) should be taken according to package directions if there are no bowel movements after 48 hours. 7.  You may have steri-strips (small skin tapes) in place directly over the incision.  These strips should be left on the skin for 7-10 days.  If your  surgeon used skin glue on the incision, you may shower in 24 hours.  The glue will flake off over the next 2-3 weeks.  Any sutures or staples will be removed at the office during your follow-up visit. You may find that a light gauze bandage over your incision may keep your staples from being rubbed or pulled. You may shower and replace the bandage daily. 8. ACTIVITIES:  You may resume regular (light) daily activities beginning the next day--such as daily self-care, walking, climbing stairs--gradually increasing activities as tolerated.  You may have sexual intercourse when it is comfortable.  Refrain from any heavy lifting or straining until approved by your doctor. a. You may drive when you no longer are taking prescription pain medication, you can comfortably wear a seatbelt, and you can safely maneuver your car and apply brakes b. Return to Work: ___________________________________ 9. You should see your doctor in the office for a follow-up appointment approximately two weeks after your surgery.  Make sure that you call for this appointment within a day or two after you arrive home to insure a convenient appointment time. OTHER INSTRUCTIONS:  _____________________________________________________________ _____________________________________________________________  WHEN TO CALL YOUR DOCTOR: 1. Fever over 101.0 2. Inability to urinate 3. Nausea and/or vomiting 4. Extreme swelling or bruising 5. Continued bleeding from incision. 6. Increased pain, redness, or drainage from the incision. 7. Difficulty swallowing or breathing 8. Muscle cramping or spasms. 9. Numbness or tingling in hands or feet or around lips.  The clinic staff is available to   answer your questions during regular business hours.  Please don't hesitate to call and ask to speak to one of the nurses if you have concerns.  For further questions, please visit [email or web address]   

## 2014-07-16 LAB — CBC
HEMATOCRIT: 19.9 % — AB (ref 36.0–46.0)
HEMOGLOBIN: 6.6 g/dL — AB (ref 12.0–15.0)
MCH: 30.3 pg (ref 26.0–34.0)
MCHC: 33.2 g/dL (ref 30.0–36.0)
MCV: 91.3 fL (ref 78.0–100.0)
Platelets: 230 10*3/uL (ref 150–400)
RBC: 2.18 MIL/uL — AB (ref 3.87–5.11)
RDW: 15.6 % — ABNORMAL HIGH (ref 11.5–15.5)
WBC: 10.4 10*3/uL (ref 4.0–10.5)

## 2014-07-16 LAB — BASIC METABOLIC PANEL
Anion gap: 3 — ABNORMAL LOW (ref 5–15)
BUN: 21 mg/dL (ref 6–23)
CHLORIDE: 103 mmol/L (ref 96–112)
CO2: 26 mmol/L (ref 19–32)
CREATININE: 1.9 mg/dL — AB (ref 0.50–1.10)
Calcium: 8.1 mg/dL — ABNORMAL LOW (ref 8.4–10.5)
GFR calc non Af Amer: 25 mL/min — ABNORMAL LOW (ref >90)
GFR, EST AFRICAN AMERICAN: 29 mL/min — AB (ref >90)
GLUCOSE: 138 mg/dL — AB (ref 70–99)
POTASSIUM: 3.7 mmol/L (ref 3.5–5.1)
Sodium: 132 mmol/L — ABNORMAL LOW (ref 135–145)

## 2014-07-16 LAB — GLUCOSE, CAPILLARY
GLUCOSE-CAPILLARY: 151 mg/dL — AB (ref 70–99)
GLUCOSE-CAPILLARY: 117 mg/dL — AB (ref 70–99)
Glucose-Capillary: 263 mg/dL — ABNORMAL HIGH (ref 70–99)
Glucose-Capillary: 204 mg/dL — ABNORMAL HIGH (ref 70–99)

## 2014-07-16 LAB — PREPARE RBC (CROSSMATCH)

## 2014-07-16 MED ORDER — INSULIN ASPART 100 UNIT/ML ~~LOC~~ SOLN
0.0000 [IU] | Freq: Three times a day (TID) | SUBCUTANEOUS | Status: DC
  Administered 2014-07-16: 2 [IU] via SUBCUTANEOUS
  Administered 2014-07-16: 5 [IU] via SUBCUTANEOUS
  Administered 2014-07-17: 3 [IU] via SUBCUTANEOUS
  Administered 2014-07-17: 1 [IU] via SUBCUTANEOUS

## 2014-07-16 MED ORDER — SODIUM CHLORIDE 0.9 % IV SOLN: Freq: Once | INTRAVENOUS | Status: DC

## 2014-07-16 NOTE — Progress Notes (Signed)
[facility] Progress Note   JADWIGA FAIDLEY IRJ:[id number] DOB: [birth year] DOA: 07/05/2014 PCP: Wallene Dales, MD  Brief narrative: Teresa Flowers is a 74 y.o. 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 She was seen by GI and was found to have a cecal mass. She was subsequently seen by general surgery and underwent exploratory laparotomy with right hemi-colectomy for adenocarcinoma.  Subjective: Did have some blood per rectum yesterday. None overnight. None this morning. No bowel movements yet. Her daughter is at the bedside.   Assessment/Plan:  Adenocarcinoma Colon s/p ex lap with right colectomy Presented with GI bleed/ melena/ cecal mass and 7 polyps. EGD unrevealing. Colonoscopy shows a 4 cm mass in right colon. Patient was seen by general surgery and underwent right colectomy. Staging CTs done- spiculated lung mass seen -see below. Post op ileus, resolving, started diet and advanced as tolerated. Some blood per rectum was noted yesterday. General surgery is aware. No BM yet. She is on a bowel regimen.   Acute blood loss anemia Patient is a Jehovah's Witness. Her hemoglobin dropped to 4.3 during this admission. Initially, she was declining blood transfusion. After multiple discussions, she subsequently agreed. She was transfused 2 units on 3/19. Hemoglobin has dropped some more today. We will transfuse her additional 2 units. Received IV Dextran 500 cc to boost stores on 3/16. Another 500 cc of IV Iron Dextran and Epogen on 3/17.   Low grade fevers post op No fever noted in the last 24 hours. Continue to monitor   Essential HTN Blood pressure is reasonably well controlled. Continue current medications. She is on Coreg, isosorbide, mononitrate. Hydralazine as needed.  ARF on CKD Stage III chronic kidney disease Cr improved from  admission (2.39). Baseline is about 1.5-1.7. Her creatinine started climbing once her Lasix was resumed. This was held yesterday.  Spiculated lung mass Needs outpt follow up for same. Preferably with pulmonology.  Hypothyroidism Continue Levothyroxine.   Type 2 diabetes mellitus, uncontrolled, with renal complications Continue SSI and low dose lantus for now. Hba1c 7.3   Deconditioning PT/OT following. . Mobilize daily. Will need home health at discharge.  Code Status: full code Family Communication: Discussed with patient and her daughter Disposition Plan: PT recommends Home health DVT prophylaxis: SCDs  Consultants: GI/surgery/pccm  Procedures:  EGD/colonoscopy 3/13, 3/14 Laparoscopic-assisted ileocolectomy 3/16 prbc transfusion 3/19  Objective: Filed Weights   07/05/14 1708 07/15/14 0517 07/15/14 2124  Weight: 101.5 kg (223 lb 12.3 oz) 108.5 kg (239 lb 3.2 oz) 110.3 kg (243 lb 2.7 oz)    Intake/Output Summary (Last 24 hours) at 07/16/14 1043 Last data filed at 07/16/14 0855  Gross per 24 hour  Intake    600 ml  Output   1100 ml  Net   -500 ml     Vitals Filed Vitals:   07/15/14 1039 07/15/14 1745 07/15/14 2124 07/16/14 0521  BP: 144/58 137/87 127/51 103/48  Pulse: 79 70 73 73  Temp: 96.7 F (35.9 C) 98.6 F (37 C) 99.1 F (37.3 C) 99 F (37.2 C)  TempSrc: Oral Oral Oral Oral  Resp: 16 17 18 18   Height:      Weight:   110.3 kg (243 lb 2.7 oz)   SpO2: 100% 97% 98% 98%    Exam:  General:  Pt is alert, not in acute distress  Cardiovascular:  S1/S2 is normal, regular. No murmur  Respiratory: clear to auscultation bilaterally   Abdomen: Soft, improved Bowel sounds,  Postop changes, no obvious tender.  MSK: No LE edema, cyanosis or clubbing  Data: Basic Metabolic Panel:  Recent Labs Lab 07/13/14 0310 07/14/14 0340 07/15/14 0535 07/16/14 0505  NA 142 137 136 132*  K 3.6 3.7 4.7 3.7  CL 114* 107 105 103  CO2 23 21 24 26   GLUCOSE 111* 140* 73  138*  BUN 17 17 20 21   CREATININE 1.60* 1.68* 1.93* 1.90*  CALCIUM 8.7 8.5 8.4 8.1*  MG  --  1.6 2.1  --    CBC:  Recent Labs Lab 07/12/14 1145 07/13/14 0310 07/14/14 0340 07/15/14 0535 07/16/14 0505  WBC 13.9* 13.6* 10.8* 9.8 10.4  HGB 7.8* 7.3* 7.7* 7.4* 6.6*  HCT 23.2* 21.9* 23.0* 22.5* 19.9*  MCV 87.5 88.3 88.8 90.0 91.3  PLT 280 291 225 273 230   CBG:  Recent Labs Lab 07/15/14 0743 07/15/14 1205 07/15/14 1648 07/15/14 2122 07/16/14 0737  GLUCAP 102* 162* 154* 215* 151*    Recent Results (from the past 240 hour(s))  Surgical pcr screen     Status: None   Collection Time: 07/08/14  8:16 PM  Result Value Ref Range Status   MRSA, PCR NEGATIVE NEGATIVE Final   Staphylococcus aureus NEGATIVE NEGATIVE Final    Comment:        The Xpert SA Assay (FDA approved for NASAL specimens in patients over 51 years of age), is one component of a comprehensive surveillance program.  Test performance has been validated by [facility] for patients greater than or equal to 89 year old. It is not intended to diagnose infection nor to guide or monitor treatment.      Scheduled Meds:  Scheduled Meds: . sodium chloride   Intravenous Once  . sodium chloride   Intravenous Once  . alvimopan  12 mg Oral BID  . carvedilol  25 mg Oral BID WC  . docusate sodium  100 mg Oral BID  . insulin aspart  0-9 Units Subcutaneous TID WC  . insulin glargine  5 Units Subcutaneous QHS  . isosorbide mononitrate  30 mg Oral Daily  . levothyroxine  88 mcg Oral QAC breakfast  . polyethylene glycol  17 g Oral Daily  . sodium chloride  500 mL Intravenous Once  . sodium chloride  3 mL Intravenous Q12H   Continuous Infusions:     Bonnielee Haff, MD Pager: [phone]  07/16/2014, 10:43 AM  LOS: 11 days     [facility] Office  [phone] Pager - Text Page per [email or web address]  If 7PM-7AM, please contact night-coverage [email or web address]

## 2014-07-16 NOTE — Progress Notes (Signed)
[facility] Progress Note  7 Days Post-Op  Subjective: Pt says she's doing well, tolerating diet well.  Having flatus, no BM yet.  Ambulating well OOB.  Had some blood pass per rectum, but has since stopped.  No other complaints, but concerned about her Hgb.  Objective: Vital signs in last 24 hours: Temp:  [96.7 F (35.9 C)-99.1 F (37.3 C)] 99 F (37.2 C) (03/23 0521) Pulse Rate:  [70-79] 73 (03/23 0521) Resp:  [16-18] 18 (03/23 0521) BP: (103-144)/(48-87) 103/48 mmHg (03/23 0521) SpO2:  [97 %-100 %] 98 % (03/23 0521) Weight:  [110.3 kg (243 lb 2.7 oz)] 110.3 kg (243 lb 2.7 oz) (03/22 2124) Last BM Date: 07/09/14  Intake/Output from previous day: 03/22 0701 - 03/23 0700 In: 600 [P.O.:600] Out: 725 [Urine:725] Intake/Output this shift: Total I/O In: 240 [P.O.:240] Out: 500 [Urine:500]  PE: Gen:  Alert, NAD, pleasant Abd: Soft, NT/ND, +BS, no HSM, midline incision C/D/I (SQ suture, dermabond)   Lab Results:   Recent Labs  07/15/14 0535 07/16/14 0505  WBC 9.8 10.4  HGB 7.4* 6.6*  HCT 22.5* 19.9*  PLT 273 230   BMET  Recent Labs  07/15/14 0535 07/16/14 0505  NA 136 132*  K 4.7 3.7  CL 105 103  CO2 24 26  GLUCOSE 73 138*  BUN 20 21  CREATININE 1.93* 1.90*  CALCIUM 8.4 8.1*   PT/INR No results for input(s): LABPROT, INR in the last 72 hours. CMP     Component Value Date/Time   NA 132* 07/16/2014 0505   K 3.7 07/16/2014 0505   CL 103 07/16/2014 0505   CO2 26 07/16/2014 0505   GLUCOSE 138* 07/16/2014 0505   BUN 21 07/16/2014 0505   CREATININE 1.90* 07/16/2014 0505   CALCIUM 8.1* 07/16/2014 0505   PROT 6.7 07/05/2014 1152   ALBUMIN 3.3* 07/05/2014 1152   AST 18 07/05/2014 1152   ALT 12 07/05/2014 1152   ALKPHOS 77 07/05/2014 1152   BILITOT 0.3 07/05/2014 1152   GFRNONAA 25* 07/16/2014 0505   GFRAA 29* 07/16/2014 0505   Lipase  No results found for: LIPASE     Studies/Results: No results  found.  Anti-infectives: Anti-infectives    Start     Dose/Rate Route Frequency Ordered Stop   07/09/14 0600  cefoTEtan (CEFOTAN) 2 g in dextrose 5 % 50 mL IVPB     2 g 100 mL/hr over 30 Minutes Intravenous On call to O.R. 07/08/14 1533 07/09/14 0850       Assessment/Plan POD #7, s/p Laparoscopic assisted right ileocolectomy for invasive adenocarcinoma -Tolerating soft diet -Mobilize and pulm toilet (PT ordered) -On floor -Start bowel regimen - dulcolax/miralax ABL anemia -Pt is a Jehovah's Witness, but apparently agreed to receive blood on 07/12/14 -Required 2 units pRBC secondary to symptomatic anemia and Hgb 4.3 -Hgb today 6.6 and she's having some bleeding per rectum DVT Prophylaxis -SCD/ no chemical given due to anemia  AKI -Cr. Is now 1.90 Disp -Home when medically stable and having BM's -Has NO staples or sutures requiring removal -Follow up appointment with Dr. Ninfa Linden listed in epic    LOS: 11 days    DORT, [facility] 07/16/2014, 9:35 AM Pager: [phone]

## 2014-07-16 NOTE — Progress Notes (Signed)
Type and screen has not yet been collected. Lab was called and made aware.   Teresa Flowers

## 2014-07-16 NOTE — Progress Notes (Signed)
Offered bath, Fam/Vistor stated she will do Pt bath. Tech gave Pt bath supplies.

## 2014-07-16 NOTE — Progress Notes (Signed)
CRITICAL VALUE ALERT  Critical value received:  Hgb=6.6  Date of notification:  07/16/14  Time of notification:  0636  Critical value read back:Yes.    Nurse who received alert:  Talbot Grumbling  MD notified (1st page):  Lamar Blinks NP  Time of first page:  [phone]

## 2014-07-17 LAB — CBC
HEMATOCRIT: 26.7 % — AB (ref 36.0–46.0)
Hemoglobin: 9 g/dL — ABNORMAL LOW (ref 12.0–15.0)
MCH: 29.5 pg (ref 26.0–34.0)
MCHC: 33.7 g/dL (ref 30.0–36.0)
MCV: 87.5 fL (ref 78.0–100.0)
Platelets: 254 10*3/uL (ref 150–400)
RBC: 3.05 MIL/uL — ABNORMAL LOW (ref 3.87–5.11)
RDW: 16 % — ABNORMAL HIGH (ref 11.5–15.5)
WBC: 10.4 10*3/uL (ref 4.0–10.5)

## 2014-07-17 LAB — TYPE AND SCREEN
ABO/RH(D): O POS
ANTIBODY SCREEN: NEGATIVE
UNIT DIVISION: 0
Unit division: 0

## 2014-07-17 LAB — BASIC METABOLIC PANEL
ANION GAP: 6 (ref 5–15)
BUN: 24 mg/dL — ABNORMAL HIGH (ref 6–23)
CHLORIDE: 107 mmol/L (ref 96–112)
CO2: 25 mmol/L (ref 19–32)
Calcium: 8.4 mg/dL (ref 8.4–10.5)
Creatinine, Ser: 1.75 mg/dL — ABNORMAL HIGH (ref 0.50–1.10)
GFR calc non Af Amer: 28 mL/min — ABNORMAL LOW (ref >90)
GFR, EST AFRICAN AMERICAN: 32 mL/min — AB (ref >90)
Glucose, Bld: 145 mg/dL — ABNORMAL HIGH (ref 70–99)
Potassium: 4.2 mmol/L (ref 3.5–5.1)
SODIUM: 138 mmol/L (ref 135–145)

## 2014-07-17 LAB — GLUCOSE, CAPILLARY
Glucose-Capillary: 142 mg/dL — ABNORMAL HIGH (ref 70–99)
Glucose-Capillary: 232 mg/dL — ABNORMAL HIGH (ref 70–99)

## 2014-07-17 MED ORDER — DOCUSATE SODIUM 100 MG PO CAPS: 100.0000 mg | ORAL_CAPSULE | Freq: Two times a day (BID) | ORAL | Status: AC

## 2014-07-17 MED ORDER — POLYETHYLENE GLYCOL 3350 17 G PO PACK: 17.0000 g | PACK | Freq: Every day | ORAL | Status: AC | PRN

## 2014-07-17 MED ORDER — ISOSORBIDE MONONITRATE ER 30 MG PO TB24: 30.0000 mg | ORAL_TABLET | Freq: Every day | ORAL | Status: AC

## 2014-07-17 MED ORDER — INSULIN GLARGINE 100 UNIT/ML ~~LOC~~ SOLN: 8.0000 [IU] | Freq: Every day | SUBCUTANEOUS | Status: AC

## 2014-07-17 MED ORDER — CARVEDILOL 25 MG PO TABS: 25.0000 mg | ORAL_TABLET | Freq: Two times a day (BID) | ORAL | Status: AC

## 2014-07-17 NOTE — Care Management Note (Signed)
CARE MANAGEMENT NOTE 07/17/2014  Patient:  Teresa Flowers, Teresa Flowers   Account Number:  [id number]  Date Initiated:  07/06/2014  Documentation initiated by:  [facility]  Subjective/Objective Assessment:   adm: presenting with acute GI bleed with some drop in H&P     Action/Plan:   discharge planning   Anticipated DC Date:  07/17/2014   Anticipated DC Plan:  [place]  CM consult      [facility] Choice  HOME HEALTH   Choice offered to / List presented to:  C-1 Patient   DME arranged  Vassie Moselle      DME agency  [place] arranged  [place].   Status of service:  Completed, signed off Medicare Important Message given?  YES (If response is "NO", the following Medicare IM given date fields will be blank) Date Medicare IM given:  07/10/2014 Medicare IM given by:  Whitman Hero Date Additional Medicare IM given:  07/17/2014 Additional Medicare IM given by:  [facility]  Discharge Disposition:  [place]  Per UR Regulation:  Reviewed for med. necessity/level of care/duration of stay  If discussed at [place] of Stay Meetings, dates discussed:    Comments:   07/17/2014 Met with pt, IM given. Rolling walker ordered for home use, and [facility] notified of plan to d/c pt to home today, [facility] services to begin 48-72 hr post d/c.   CRoyal RN MPH, case manager, [phone]  07/06/14 14:55 Cm spoke with pt to offer choice of home health agency.  Pt chooses [facility] to render HHPT/OT.  Address and contact information verified by pt.  Referral called to [facility] rep, Emma.  No other CM needs were comunicated.  Mariane Masters, BSN, CM [phone].

## 2014-07-17 NOTE — Progress Notes (Signed)
Patient Discharge: Disposition:  Patient discharged to home with home health and walker.  Education: Educated about medications, prescriptions, discharge instructions, and follow-up appointments. IV: Discontinued before discharge. Telemetry: Discontinued before discharge, CCMD notified. Transportation: Patient transported via w/c with the staff and family accompanying. Belongings: Patient took all her belongings with her.

## 2014-07-17 NOTE — Progress Notes (Signed)
Physical Therapy Treatment Patient Details Name: Teresa Flowers MRN: [id number] DOB: 1940-09-03 Today's Date: 07/17/2014    History of Present Illness Pt admit with GIB.      PT Comments    Pt. Did request that she use a RW this am for stability but says she does not believe she will need one at home.  She is progressing toward goal of consistent modified independent ambulation.  May need only one more session and hopefully will reach all goals.    Follow Up Recommendations  Home health PT;Supervision/Assistance - 24 hour     Equipment Recommendations  Rolling walker with 5" wheels    Recommendations for Other Services       Precautions / Restrictions Precautions Precautions: Fall Restrictions Weight Bearing Restrictions: No    Mobility  Bed Mobility Overal bed mobility: Needs Assistance Bed Mobility: Supine to Sit     Supine to sit: Supervision     General bed mobility comments: pt. needed use of bedrails to move to EOB  Transfers Overall transfer level: Needs assistance Equipment used: Rolling walker (2 wheeled) Transfers: Sit to/from Stand Sit to Stand: Supervision         General transfer comment: supervision for safety in sit to stand, some slight unsteadiness  Ambulation/Gait Ambulation/Gait assistance: Supervision;Modified independent (Device/Increase time) Ambulation Distance (Feet): 200 Feet Assistive device: Rolling walker (2 wheeled) Gait Pattern/deviations: Step-through pattern;Decreased stride length Gait velocity: decreased   General Gait Details: pt. requested to use RW today and says she feels like she needs the stability it offers.  SHe has initial unsteadiness requiring closer supervision, then advanced to mod I level for completion of her walk.     Stairs            Wheelchair Mobility    Modified Rankin (Stroke Patients Only)       Balance                                    Cognition  Arousal/Alertness: Awake/alert Behavior During Therapy: WFL for tasks assessed/performed Overall Cognitive Status: Within Functional Limits for tasks assessed                      Exercises      General Comments        Pertinent Vitals/Pain Pain Assessment: No/denies pain    Home Living                      Prior Function            PT Goals (current goals can now be found in the care plan section) Progress towards PT goals: Progressing toward goals    Frequency  Min 3X/week    PT Plan Current plan remains appropriate    Co-evaluation             End of Session Equipment Utilized During Treatment: Gait belt Activity Tolerance: Patient tolerated treatment well Patient left: in chair;with call bell/phone within reach;with family/visitor present     Time: 7048-8891 PT Time Calculation (min) (ACUTE ONLY): 15 min  Charges:  $Gait Training: 8-22 mins                    G Codes:      Ladona Ridgel 07/17/2014, 10:31 AM Gerlean Ren PT Acute Rehab Services [phone] [place] [phone]

## 2014-07-17 NOTE — Progress Notes (Signed)
[facility] Progress Note  8 Days Post-Op  Subjective: Pt doing great.  Tolerating diet, ambulating well, having good BM's without blood.  Wants to go home.  No other complaints/concerns.  Objective: Vital signs in last 24 hours: Temp:  [98.1 F (36.7 C)-99.6 F (37.6 C)] 98.1 F (36.7 C) (03/24 1004) Pulse Rate:  [66-77] 69 (03/24 1004) Resp:  [18] 18 (03/24 1004) BP: (108-171)/(42-85) 108/70 mmHg (03/24 1004) SpO2:  [98 %-100 %] 100 % (03/24 1004) Weight:  [110.4 kg (243 lb 6.2 oz)] 110.4 kg (243 lb 6.2 oz) (03/23 1940) Last BM Date: 07/16/14  Intake/Output from previous day: 03/23 0701 - 03/24 0700 In: 1510 [P.O.:840; Blood:670] Out: 2300 [Urine:2300] Intake/Output this shift: Total I/O In: 236 [P.O.:236] Out: 650 [Urine:650]  PE: Gen:  Alert, NAD, pleasant Abd: Soft, NT/ND, +BS, no HSM, midline incisions C/D/I   Lab Results:   Recent Labs  07/16/14 0505 07/17/14 0615  WBC 10.4 10.4  HGB 6.6* 9.0*  HCT 19.9* 26.7*  PLT 230 254   BMET  Recent Labs  07/16/14 0505 07/17/14 0615  NA 132* 138  K 3.7 4.2  CL 103 107  CO2 26 25  GLUCOSE 138* 145*  BUN 21 24*  CREATININE 1.90* 1.75*  CALCIUM 8.1* 8.4   PT/INR No results for input(s): LABPROT, INR in the last 72 hours. CMP     Component Value Date/Time   NA 138 07/17/2014 0615   K 4.2 07/17/2014 0615   CL 107 07/17/2014 0615   CO2 25 07/17/2014 0615   GLUCOSE 145* 07/17/2014 0615   BUN 24* 07/17/2014 0615   CREATININE 1.75* 07/17/2014 0615   CALCIUM 8.4 07/17/2014 0615   PROT 6.7 07/05/2014 1152   ALBUMIN 3.3* 07/05/2014 1152   AST 18 07/05/2014 1152   ALT 12 07/05/2014 1152   ALKPHOS 77 07/05/2014 1152   BILITOT 0.3 07/05/2014 1152   GFRNONAA 28* 07/17/2014 0615   GFRAA 32* 07/17/2014 0615   Lipase  No results found for: LIPASE     Studies/Results: No results found.  Anti-infectives: Anti-infectives    Start     Dose/Rate Route Frequency Ordered Stop   07/09/14 0600   cefoTEtan (CEFOTAN) 2 g in dextrose 5 % 50 mL IVPB     2 g 100 mL/hr over 30 Minutes Intravenous On call to O.R. 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 Improved now 1.75 Disp -Home today per medical service -Has NO staples or sutures requiring removal -Follow up appointment with Dr. Ninfa Linden listed in epic    LOS: 12 days    DORT, [facility] 07/17/2014, 10:22 AM Pager: [phone]

## 2014-07-17 NOTE — Discharge Summary (Signed)
[facility]  Physician Discharge Summary   Patient ID: Teresa Flowers MRN: [id number] DOB/AGE: 08-15-1940 74 y.o.  Admit date: 07/05/2014 Discharge date: 07/17/2014  PCP: ASRES,ALEHEGN, MD  DISCHARGE DIAGNOSES:  Principal Problem:   GI bleed Active Problems:   Essential hypertension   Hypothyroid   High cholesterol   Type 2 diabetes mellitus, uncontrolled, with renal complications   Stage III chronic kidney disease   Intermediate coronary syndrome   Acute blood loss anemia   Acute on chronic kidney failure   Acute GI bleeding   Melena   Cecum mass   Benign neoplasm of sigmoid colon   Benign neoplasm of ascending colon   Colon cancer   RECOMMENDATIONS FOR OUTPATIENT FOLLOW UP: 1. Home health has been arranged. 2. Patient to follow-up with pulmonology regarding her right lung mass. Appointment has been made. 3. Close monitoring of hemoglobin as outpatient.   DISCHARGE CONDITION: fair  Diet recommendation: Modified carbohydrate  Filed Weights   07/15/14 0517 07/15/14 2124 07/16/14 1940  Weight: 108.5 kg (239 lb 3.2 oz) 110.3 kg (243 lb 2.7 oz) 110.4 kg (243 lb 6.2 oz)    INITIAL HISTORY: Teresa Flowers is a 74 y.o. 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 She was seen by GI and was found to have a cecal mass. She was subsequently seen by general surgery and underwent exploratory laparotomy with right hemi-colectomy for adenocarcinoma.  Consultants: GI/surgery/pccm  Procedures:  EGD/colonoscopy 3/13, 3/14 Laparoscopic-assisted ileocolectomy 3/16 prbc transfusion 3/19  HOSPITAL COURSE:   Adenocarcinoma Colon s/p ex lap with right colectomy Presented with GI bleed/ melena. EGD was unrevealing. Colonoscopy showed polyps and a 4 cm mass in right colon. Patient was seen by general surgery and underwent  right colectomy. Staging CTs done- spiculated lung mass seen -see below. Post op she developed ileus. Patient was mobilized. She was placed on a bowel regimen. She is finally had bowel movements.she was tolerating her diet. She did notice some blood per rectum 2 days ago but none since then. She was cleared by general surgery.  Acute blood loss anemia Patient is a Jehovah's Witness. Her hemoglobin dropped to 4.3 during this admission. Initially, she was declining blood transfusion. After multiple discussions, she subsequently agreed. She was transfused 2 units on 3/19. Hemoglobin has dropped again to 6.6 on 3/23. She was transfused additional 2 units. She also received IV Dextran 500 cc to boost stores on 3/16. Another 500 cc of IV Iron Dextran and Epogen on 3/17. Hemoglobin is 9.0 today. Will need close monitoring as outpatient.  Low grade fevers post op No fever noted in the last 24 hours.   Essential HTN Blood pressure is reasonably well controlled. Continue current medications. She is on Coreg, isosorbide, mononitrate.  ARF on CKD Stage III chronic kidney disease Cr improved from admission (2.39). Baseline is about 1.5-1.7. Her creatinine started climbing once her Lasix was resumed. This was held and her creatinine has improved. We'll keep her off of Lasix for now.  Spiculated lung mass This was detected on CT of the chest. Outpatient appointment with pulmonology has been made for further workup of the same.  Hypothyroidism Continue Levothyroxine.   Type 2 diabetes mellitus, uncontrolled, with renal complications He may continue her home medication regimen. Lantus at a lower dose.  Overall, stable. Patient is keen on going home. She can be discharged today.  PERTINENT LABS:  The results of significant diagnostics from this hospitalization (including imaging, microbiology, ancillary and laboratory) are listed below for reference.    Microbiology: Recent Results (from the past 240  hour(s))  Surgical pcr screen     Status: None   Collection Time: 07/08/14  8:16 PM  Result Value Ref Range Status   MRSA, PCR NEGATIVE NEGATIVE Final   Staphylococcus aureus NEGATIVE NEGATIVE Final    Comment:        The Xpert SA Assay (FDA approved for NASAL specimens in patients over 51 years of age), is one component of a comprehensive surveillance program.  Test performance has been validated by [facility] for patients greater than or equal to 45 year old. It is not intended to diagnose infection nor to guide or monitor treatment.      Labs: Basic Metabolic Panel:  Recent Labs Lab 07/13/14 0310 07/14/14 0340 07/15/14 0535 07/16/14 0505 07/17/14 0615  NA 142 137 136 132* 138  K 3.6 3.7 4.7 3.7 4.2  CL 114* 107 105 103 107  CO2 23 21 24 26 25   GLUCOSE 111* 140* 73 138* 145*  BUN 17 17 20 21  24*  CREATININE 1.60* 1.68* 1.93* 1.90* 1.75*  CALCIUM 8.7 8.5 8.4 8.1* 8.4  MG  --  1.6 2.1  --   --    CBC:  Recent Labs Lab 07/13/14 0310 07/14/14 0340 07/15/14 0535 07/16/14 0505 07/17/14 0615  WBC 13.6* 10.8* 9.8 10.4 10.4  HGB 7.3* 7.7* 7.4* 6.6* 9.0*  HCT 21.9* 23.0* 22.5* 19.9* 26.7*  MCV 88.3 88.8 90.0 91.3 87.5  PLT 291 225 273 230 254   CBG:  Recent Labs Lab 07/16/14 1130 07/16/14 1643 07/16/14 2101 07/17/14 0750 07/17/14 1152  GLUCAP 117* 263* 204* 142* 232*     IMAGING STUDIES Ct Abdomen Pelvis Wo Contrast  07/08/2014   CLINICAL DATA:  Colon cancer  EXAM: CT CHEST, ABDOMEN, AND PELVIS WITHOUT CONTRAST  TECHNIQUE: Multidetector CT imaging of the chest, abdomen, and pelvis was performed following the standard protocol without IV contrast.  COMPARISON:  None.  FINDINGS: Lack of intravenous contrast. Oral contrast is only present in the colon an a few small bowel loops. These factors severely limits this examination. Metastatic disease can be missed by this study.  Small mediastinal nodes. Three vessel coronary artery calcifications.  No  pneumothorax.  No pleural effusion.  Sub solid 10 mm opacity at the right apex. Spiculated and cavitary irregular lesion in the superior segment of the right lower lobe measures 1.6 x 2.8 cm on image 23. Vague ground-glass opacity in the right lower lobe on image 27. Left lung is clear.  The unenhanced liver, gallbladder, spleen, pancreas, adrenal glands are grossly within normal limits. Chronic changes of the kidneys which are lobulated. No obvious mass.  There is irregular wall thickening in this cecum. See image 80 of series 2. This may correspond to the patient's known mass in the cecum. The appendix is unremarkable. Terminal ileum is unremarkable. No obvious mesenteric adenopathy. No obvious retroperitoneal adenopathy by measurement criteria.  Calcified uterine fibroids. Bladder and adnexa are within normal limits.  No destructive bone lesion. Left hemi laminectomy at L5-S1. Spinal stenosis at L4-5. Facet arthropathy throughout the lumbar spine. Severe degenerative disc disease at L5-S1 with vacuum.  IMPRESSION: Limited study as delineated above.  1.6 x 2.8 cm cavitary and spiculated lung mass in the superior segment of the right lower lobe. PET-CT is recommended.  There are ground-glass  opacities and sub solid nodules in the right lung as described.  No obvious mediastinal adenopathy.  Wall thickening in the cecum which may correspond to the patient's known lesion.   Electronically Signed   By: Marybelle Killings M.D.   On: 07/08/2014 15:36   Ct Chest Wo Contrast  07/08/2014   CLINICAL DATA:  Colon cancer  EXAM: CT CHEST, ABDOMEN, AND PELVIS WITHOUT CONTRAST  TECHNIQUE: Multidetector CT imaging of the chest, abdomen, and pelvis was performed following the standard protocol without IV contrast.  COMPARISON:  None.  FINDINGS: Lack of intravenous contrast. Oral contrast is only present in the colon an a few small bowel loops. These factors severely limits this examination. Metastatic disease can be missed by this  study.  Small mediastinal nodes. Three vessel coronary artery calcifications.  No pneumothorax.  No pleural effusion.  Sub solid 10 mm opacity at the right apex. Spiculated and cavitary irregular lesion in the superior segment of the right lower lobe measures 1.6 x 2.8 cm on image 23. Vague ground-glass opacity in the right lower lobe on image 27. Left lung is clear.  The unenhanced liver, gallbladder, spleen, pancreas, adrenal glands are grossly within normal limits. Chronic changes of the kidneys which are lobulated. No obvious mass.  There is irregular wall thickening in this cecum. See image 80 of series 2. This may correspond to the patient's known mass in the cecum. The appendix is unremarkable. Terminal ileum is unremarkable. No obvious mesenteric adenopathy. No obvious retroperitoneal adenopathy by measurement criteria.  Calcified uterine fibroids. Bladder and adnexa are within normal limits.  No destructive bone lesion. Left hemi laminectomy at L5-S1. Spinal stenosis at L4-5. Facet arthropathy throughout the lumbar spine. Severe degenerative disc disease at L5-S1 with vacuum.  IMPRESSION: Limited study as delineated above.  1.6 x 2.8 cm cavitary and spiculated lung mass in the superior segment of the right lower lobe. PET-CT is recommended.  There are ground-glass opacities and sub solid nodules in the right lung as described.  No obvious mediastinal adenopathy.  Wall thickening in the cecum which may correspond to the patient's known lesion.   Electronically Signed   By: Marybelle Killings M.D.   On: 07/08/2014 15:36    DISCHARGE EXAMINATION: Filed Vitals:   07/16/14 1850 07/16/14 1940 07/16/14 2147 07/17/14 1004  BP: 132/42 129/52 171/85 108/70  Pulse: 74 76 71 69  Temp: 98.9 F (37.2 C) 98.7 F (37.1 C) 98.7 F (37.1 C) 98.1 F (36.7 C)  TempSrc: Oral Oral Oral Oral  Resp: 18 18 18 18   Height:      Weight:  110.4 kg (243 lb 6.2 oz)    SpO2: 99% 99% 98% 100%   General appearance: alert,  cooperative, appears stated age and no distress Resp: clear to auscultation bilaterally Cardio: regular rate and rhythm, S1, S2 normal, no murmur, click, rub or gallop GI: soft, non-tender; bowel sounds normal; no masses,  no organomegaly and Incision site looks clean. Extremities: extremities normal, atraumatic, no cyanosis or edema  DISPOSITION: Home with family  Discharge Instructions    Call MD for:  extreme fatigue    Complete by:  As directed      Call MD for:  persistant dizziness or light-headedness    Complete by:  As directed      Call MD for:  persistant nausea and vomiting    Complete by:  As directed      Call MD for:  redness, tenderness, or signs of infection (  pain, swelling, redness, odor or green/yellow discharge around incision site)    Complete by:  As directed      Call MD for:  severe uncontrolled pain    Complete by:  As directed      Call MD for:  temperature >100.4    Complete by:  As directed      Diet - low sodium heart healthy    Complete by:  As directed      Discharge instructions    Complete by:  As directed   Please follow up with the [facility] for the lung spot noted on CT scan. Keep your other appointments.     Increase activity slowly    Complete by:  As directed            ALLERGIES:  Allergies  Allergen Reactions  . Other Other (See Comments)    All acid containing foods such as citrus, tomatoes "make me break out inside"     Current Discharge Medication List    START taking these medications   Details  carvedilol (COREG) 25 MG tablet Take 1 tablet (25 mg total) by mouth 2 (two) times daily with a meal. Qty: 60 tablet, Refills: 0    docusate sodium (COLACE) 100 MG capsule Take 1 capsule (100 mg total) by mouth 2 (two) times daily. Qty: 60 capsule, Refills: 0    isosorbide mononitrate (IMDUR) 30 MG 24 hr tablet Take 1 tablet (30 mg total) by mouth daily. Qty: 30 tablet, Refills: 0    polyethylene glycol (MIRALAX / GLYCOLAX)  packet Take 17 g by mouth daily as needed for mild constipation. Qty: 14 each, Refills: 0      CONTINUE these medications which have CHANGED   Details  insulin glargine (LANTUS) 100 UNIT/ML injection Inject 0.08 mLs (8 Units total) into the skin at bedtime. Qty: 10 mL, Refills: 11      CONTINUE these medications which have NOT CHANGED   Details  insulin lispro (HUMALOG) 100 UNIT/ML injection Inject 2-6 Units into the skin 3 (three) times daily before meals.     levothyroxine (SYNTHROID, LEVOTHROID) 88 MCG tablet Take 88 mcg by mouth daily before breakfast.    rosuvastatin (CRESTOR) 10 MG tablet Take 10 mg by mouth at bedtime.     B-D ULTRAFINE III SHORT PEN 31G X 8 MM MISC Refills: 3    meclizine (ANTIVERT) 25 MG tablet Take 25 mg by mouth 3 (three) times daily as needed for dizziness.    ONETOUCH VERIO test strip Refills: 6    Vitamin D, Ergocalciferol, (DRISDOL) 50000 UNITS CAPS capsule Take 50,000 Units by mouth once a week. Refills: 0      STOP taking these medications     amLODipine (NORVASC) 5 MG tablet      lisinopril (PRINIVIL,ZESTRIL) 20 MG tablet        Follow-up Information    Follow up with [facility].   Why:  home health physical and occupational therapy   Contact information:   [address] [place] [address] [phone]       Follow up with Harl Bowie, MD. Go on 08/06/2014.   Specialty:  General Surgery   Why:  For post-operation check at 11:30am with Dr. Ninfa Linden, but please arrive by 11:00am to check in and fill out paperwork.     Contact information:   [address] [place]  [address] [phone]       Follow up  with ASRES,ALEHEGN, MD. Schedule an appointment as soon as possible for a visit in 1 week.   Specialty:  Family Medicine   Why:  post hospitalization follow up   Contact information:   [address] [place] [address] [phone]       Follow up with  Christinia Gully, MD On 08/05/2014.   Specialty:  Pulmonary Disease   Why:  08/05/14 at 12PM. For the spot on the lung. Please bring all your home medications with you when you come for the appointment.   Contact information:   [address] [address] [phone]       TOTAL DISCHARGE TIME: 35 mins  [facility] Pager [phone]  07/17/2014, 1:30 PM

## 2014-07-17 NOTE — Progress Notes (Signed)
Offered Pt a bath, Patient stated she will do bath later. Tech will retry later.

## 2014-07-17 NOTE — Progress Notes (Signed)
2nd attempt to do bath, Pt continues to refused.

## 2014-07-23 ENCOUNTER — Encounter (HOSPITAL_COMMUNITY): Payer: Self-pay

## 2014-08-05 ENCOUNTER — Ambulatory Visit (INDEPENDENT_AMBULATORY_CARE_PROVIDER_SITE_OTHER): Payer: Medicare Other | Admitting: Internal Medicine

## 2014-08-05 ENCOUNTER — Ambulatory Visit (INDEPENDENT_AMBULATORY_CARE_PROVIDER_SITE_OTHER)
Admission: RE | Admit: 2014-08-05 | Discharge: 2014-08-05 | Disposition: A | Discharge status: Home / Self Care | Payer: Medicare Other | Source: Ambulatory Visit | Attending: Internal Medicine | Admitting: Internal Medicine

## 2014-08-05 ENCOUNTER — Encounter: Payer: Self-pay | Admitting: Internal Medicine

## 2014-08-05 VITALS — BP 130/80 | HR 60 | Ht 65.0 in | Wt 226.0 lb

## 2014-08-05 DIAGNOSIS — R0600 Dyspnea, unspecified: Secondary | ICD-10-CM | POA: Diagnosis not present

## 2014-08-05 DIAGNOSIS — R911 Solitary pulmonary nodule: Secondary | ICD-10-CM | POA: Diagnosis not present

## 2014-08-05 NOTE — Patient Instructions (Addendum)
Please remember to go to the  x-ray department downstairs for your tests - we will call you with the results when they are available and decide on the next step

## 2014-08-05 NOTE — Progress Notes (Signed)
Subjective:    Patient ID: Teresa Flowers, female    DOB: 1940/10/08,    MRN: [id number]  HPI   78 yobf Jehovah's witness  quit smoking 1995 no resp problems referred back to pulmonary service post admit for LGIB from colon Ca stage II   Admit date: 07/05/2014 Discharge date: 07/17/2014     DISCHARGE DIAGNOSES:  Principal Problem:  GI bleed Active Problems:  Essential hypertension  Hypothyroid  High cholesterol  Type 2 diabetes mellitus, uncontrolled, with renal complications  Stage III chronic kidney disease  Intermediate coronary syndrome  Acute blood loss anemia  Acute on chronic kidney failure  Acute GI bleeding  Melena  Cecum mass  Benign neoplasm of sigmoid colon  Benign neoplasm of ascending colon  Colon cancer > partial colectomy 07/09/14  Into muscularis but neg nodes so II stage    08/05/2014 1st [facility] office visit/ Demara Lover   Chief Complaint  Patient presents with  . Pulmonary Consult    Referred by Dr. Fredrich Birks for eval of lung mass.      Not limited by breathing from desired activities    No   chronic cough or cp or chest tightness, subjective wheeze overt sinus or hb symptoms. No unusual exp hx or h/o childhood pna/ asthma or knowledge of premature birth.  Sleeping ok without nocturnal  or early am exacerbation  of respiratory  c/o's or need for noct saba. Also denies any obvious fluctuation of symptoms with weather or environmental changes or other aggravating or alleviating factors except as outlined above   Current Medications, Allergies, Complete Past Medical History, Past Surgical History, Family History, and Social History were reviewed in [employer] record.  ROS  The following are not active complaints unless bolded sore throat, dysphagia, dental problems, itching, sneezing,  nasal congestion or excess/ purulent secretions, ear ache,   fever, chills, sweats, unintended wt loss, pleuritic or exertional  cp, hemoptysis,  orthopnea pnd or leg swelling, presyncope, palpitations, heartburn, abdominal pain, anorexia, nausea, vomiting, diarrhea  or change in bowel or urinary habits, change in stools or urine, dysuria,hematuria,  rash, arthralgias, visual complaints, headache, numbness weakness or ataxia or problems with walking or coordination,  change in mood/affect or memory.            Review of Systems  Constitutional: Positive for appetite change. Negative for fever, chills and unexpected weight change.  HENT: Negative for congestion, dental problem, ear pain, nosebleeds, postnasal drip, rhinorrhea, sinus pressure, sneezing, sore throat, trouble swallowing and voice change.   Eyes: Negative for visual disturbance.  Respiratory: Negative for cough, choking and shortness of breath.   Cardiovascular: Negative for chest pain and leg swelling.  Gastrointestinal: Negative for vomiting, abdominal pain and diarrhea.  Genitourinary: Negative for difficulty urinating.  Musculoskeletal: Negative for arthralgias.  Skin: Negative for rash.  Neurological: Negative for tremors, syncope and headaches.  Hematological: Does not bruise/bleed easily.       Objective:   Physical Exam  amb bf nad walks with 2 pronged rolling walker  Wt Readings from Last 3 Encounters:  08/05/14 226 lb (102.513 kg)  07/16/14 243 lb 6.2 oz (110.4 kg)  12/17/13 224 lb 13.9 oz (102 kg)    Vital signs reviewed   HEENT: edentulous, nl turbinates, and orophanx. Nl external ear canals without cough reflex   NECK :  without JVD/Nodes/TM/ nl carotid upstrokes bilaterally   LUNGS: no acc muscle use, clear to A and P bilaterally without cough  on insp or exp maneuvers   CV:  RRR  no s3 or murmur or increase in P2, no edema   ABD:  soft and nontender with nl excursion in the supine position. No bruits or organomegaly, bowel sounds nl  MS:  warm without deformities, calf tenderness, cyanosis or clubbing  SKIN: warm and  dry without lesions    NEURO:  alert, approp, no deficits    CXR PA and Lateral:   08/05/2014 :     I personally reviewed images and agree with radiology impression as follows:    Probable revisualization of superior segment right lower lobe opacity,        Assessment & Plan:

## 2014-08-06 ENCOUNTER — Encounter: Payer: Self-pay | Admitting: Internal Medicine

## 2014-08-06 ENCOUNTER — Telehealth: Payer: Self-pay | Admitting: Internal Medicine

## 2014-08-06 NOTE — Progress Notes (Signed)
Quick Note:  LMTCB ______ 

## 2014-08-06 NOTE — Assessment & Plan Note (Signed)
-   See CT chest  07/08/14  - Spirometry 08/05/2014  FEV1  1.27 (69%)  Ratio 71    I had an extended discussion with the patient and daughters  reviewing all relevant studies completed to date and  Lasting 25 minutes of a 40  minute visit on the following ongoing concerns:   1)  The area of concern can probably be seen on cxr from 04/08/14 but is definitely bigger on today's cxr (making it unlikely it was related to her acute admit  2) she is a candidate for sure for a R sup segmentectomy though the lesion is at the apex of this segment very close to the fissure/ RUL  3) unlikely to be related to Stage II colon ca and she has a smoking hx so this is a lung primary until proven otherwise  4) PET scan may shed more light on the lesion and it's staging and if looks like a stage I or II I would rec excisional bx rather than IR bx  5) JW status will need to be taken into consideration as one of her preop risks.

## 2014-08-06 NOTE — Telephone Encounter (Signed)
MW forwarding as Juluis Rainier since you were the one to call.  Thanks!

## 2014-08-07 ENCOUNTER — Other Ambulatory Visit: Payer: Self-pay | Admitting: Internal Medicine

## 2014-08-07 DIAGNOSIS — R911 Solitary pulmonary nodule: Secondary | ICD-10-CM

## 2014-08-08 NOTE — Telephone Encounter (Signed)
Dr. Melvyn Novas please advise if this message can be closed.  Thanks!

## 2014-08-19 ENCOUNTER — Encounter (HOSPITAL_COMMUNITY)
Admission: RE | Admit: 2014-08-19 | Discharge: 2014-08-19 | Disposition: A | Discharge status: Home / Self Care | Payer: Medicare Other | Source: Ambulatory Visit | Attending: Internal Medicine | Admitting: Internal Medicine

## 2014-08-19 ENCOUNTER — Other Ambulatory Visit: Payer: Self-pay | Admitting: Internal Medicine

## 2014-08-19 DIAGNOSIS — R911 Solitary pulmonary nodule: Secondary | ICD-10-CM | POA: Diagnosis present

## 2014-08-19 LAB — GLUCOSE, CAPILLARY: Glucose-Capillary: 119 mg/dL — ABNORMAL HIGH (ref 70–99)

## 2014-08-19 MED ORDER — FLUDEOXYGLUCOSE F - 18 (FDG) INJECTION
11.3200 | Freq: Once | INTRAVENOUS | Status: AC | PRN
  Administered 2014-08-19: 11.32 via INTRAVENOUS

## 2014-08-19 NOTE — Progress Notes (Signed)
Quick Note:  Order was sent to [facility] ______ 

## 2014-08-27 ENCOUNTER — Other Ambulatory Visit: Payer: Self-pay | Admitting: *Deleted

## 2014-08-27 ENCOUNTER — Institutional Professional Consult (permissible substitution) (INDEPENDENT_AMBULATORY_CARE_PROVIDER_SITE_OTHER): Payer: Medicare Other | Admitting: Thoracic Surgery (Cardiothoracic Vascular Surgery)

## 2014-08-27 ENCOUNTER — Encounter: Payer: Self-pay | Admitting: Thoracic Surgery (Cardiothoracic Vascular Surgery)

## 2014-08-27 VITALS — BP 177/93 | HR 63 | Resp 20 | Ht 65.0 in | Wt 226.0 lb

## 2014-08-27 DIAGNOSIS — C189 Malignant neoplasm of colon, unspecified: Secondary | ICD-10-CM

## 2014-08-27 DIAGNOSIS — R918 Other nonspecific abnormal finding of lung field: Secondary | ICD-10-CM | POA: Diagnosis not present

## 2014-08-27 DIAGNOSIS — R911 Solitary pulmonary nodule: Secondary | ICD-10-CM

## 2014-08-27 NOTE — Progress Notes (Signed)
PCP is ASRES,ALEHEGN, MD Referring Provider is Tanda Rockers, MD  Chief Complaint  Patient presents with  . Lung Mass    Surgical eval, Chest CT 07/08/14, PET Scan 08/19/14    HPI: 74 year old woman sent for consultation regarding a right lower lobe nodule.  Mrs. Torbeck is a 74 year old [place] with a past medical history significant for hypertension, hypothyroidism, hyperlipidemia, complicated type 2 diabetes, stage III chronic kidney disease, prior stroke, cerebrovascular disease, obesity, reflux, and colon cancer.   She was admitted in March with acute GI bleeding. She was diagnosed with colon cancer. She underwent a right hemicolectomy on 07/09/2014. She was discharged from the hospital on 07/17/2014. During her hospitalization she was found to have a nodule in the superior segment of her right lower lobe. This was confirmed with a CT scan which also showed other groundglass opacities, but the dominant lesion was in the right lower lobe. She recently saw Dr. Melvyn Novas. A PET CT showed the lesion to be mildly hypermetabolic with an SUV of 2.8. There is no evidence of regional or distant metastases.  She says that she is recovered well from her surgery. Her appetite is good. She's not sure she may have lost a few pounds, but denies significant weight loss. She's not having any chest pain, pressure, or tightness. She denies shortness of breath or wheezing. She has no occasional cough and sneezing due to allergies. She denies hemoptysis. Says she has not had any further GI bleeding since her surgery.  Of note she did consent to and have blood transfusions during her last admission.    Past Medical History  Diagnosis Date  . Essential hypertension   . Vertigo   . Hypothyroid   . High cholesterol   . Type 2 diabetes mellitus, uncontrolled, with renal complications   . Anemia in chronic kidney disease   . Stage III chronic kidney disease   . Cerebrovascular disease     prior  infarcts seen on head CT per patient  . Obesity   . Hypertensive urgency 12/16/2013  . Refusal of blood product   . TIA (transient ischemic attack)   . GERD (gastroesophageal reflux disease)   . Intermediate coronary syndrome     Past Surgical History  Procedure Laterality Date  . Cardiovascular stress test  10/29/13    mildly abnoraml adenosine myoview EF 53%  . Colonoscopy    . Cyst excision    . Left heart catheterization with coronary angiogram N/A 12/16/2013    Procedure: LEFT HEART CATHETERIZATION WITH CORONARY ANGIOGRAM;  Surgeon: Jettie Booze, MD;  Location: [facility] CATH LAB;  Service: Cardiovascular;  Laterality: N/A;  . Esophagogastroduodenoscopy N/A 07/06/2014    Procedure: ESOPHAGOGASTRODUODENOSCOPY (EGD);  Surgeon: Jerene Bears, MD;  Location: [facility] ENDOSCOPY;  Service: Endoscopy;  Laterality: N/A;  . Colonoscopy N/A 07/07/2014    Procedure: COLONOSCOPY;  Surgeon: Irene Shipper, MD;  Location: [facility];  Service: Endoscopy;  Laterality: N/A;  MAC also okay  . Laparoscopic partial colectomy N/A 07/09/2014    Procedure: LAPAROSCOPIC ASSISTED PARTIAL COLECTOMY;  Surgeon: Coralie Keens, MD;  Location: [facility] OR;  Service: General;  Laterality: N/A;    Family History  Problem Relation Age of Onset  . Heart disease Mother   . Transient ischemic attack Brother   . Heart attack Daughter     Social History History  Substance Use Topics  . Smoking status: Former Smoker -- 0.50 packs/day for 40 years    Types: Cigarettes  Quit date: 04/25/1993  . Smokeless tobacco: Never Used  . Alcohol Use: No    Current Outpatient Prescriptions  Medication Sig Dispense Refill  . B-D ULTRAFINE III SHORT PEN 31G X 8 MM MISC   3  . carvedilol (COREG) 25 MG tablet Take 1 tablet (25 mg total) by mouth 2 (two) times daily with a meal. 60 tablet 0  . docusate sodium (COLACE) 100 MG capsule Take 1 capsule (100 mg total) by mouth 2 (two) times daily. 60 capsule 0  . furosemide (LASIX) 20 MG  tablet Take 20 mg by mouth daily.    . insulin glargine (LANTUS) 100 UNIT/ML injection Inject 0.08 mLs (8 Units total) into the skin at bedtime. 10 mL 11  . insulin lispro (HUMALOG) 100 UNIT/ML injection Inject 2-6 Units into the skin 3 (three) times daily before meals.     . isosorbide mononitrate (IMDUR) 30 MG 24 hr tablet Take 1 tablet (30 mg total) by mouth daily. 30 tablet 0  . levothyroxine (SYNTHROID, LEVOTHROID) 88 MCG tablet Take 88 mcg by mouth daily before breakfast.    . ONETOUCH VERIO test strip   6  . polyethylene glycol (MIRALAX / GLYCOLAX) packet Take 17 g by mouth daily as needed for mild constipation. 14 each 0  . Vitamin D, Ergocalciferol, (DRISDOL) 50000 UNITS CAPS capsule Take 50,000 Units by mouth once a week.  0   No current facility-administered medications for this visit.    Allergies  Allergen Reactions  . Other Other (See Comments)    All acid containing foods such as citrus, tomatoes "make me break out inside"    Review of Systems  Constitutional: Negative for fever, appetite change and unexpected weight change (not sure).  Respiratory: Positive for cough. Negative for chest tightness, shortness of breath and wheezing.   Cardiovascular: Negative for chest pain.  Gastrointestinal: Positive for blood in stool. Negative for abdominal pain.       Recent hemi-colectomy  Endocrine: Negative.   Musculoskeletal: Positive for gait problem (uses cane).  Neurological: Negative.  Negative for seizures and headaches.  All other systems reviewed and are negative.   BP 177/93 mmHg  Pulse 63  Resp 20  Ht '5\' 5"'$  (1.651 m)  Wt 226 lb (102.513 kg)  BMI 37.61 kg/m2  SpO2 95% Physical Exam  Constitutional: She is oriented to person, place, and time. No distress.  Markedly obese  HENT:  Head: Normocephalic and atraumatic.  Eyes: EOM are normal. Pupils are equal, round, and reactive to light.  Neck: Neck supple.  Cardiovascular: Normal rate, regular rhythm and normal  heart sounds.   No murmur heard. Pulmonary/Chest: Effort normal. She has no wheezes. She has no rales.  Diminished breath sounds both bases  Abdominal: Soft. There is no tenderness.  Midline surgical scar healing well  Musculoskeletal: She exhibits no edema.  Lymphadenopathy:    She has no cervical adenopathy.  Neurological: She is alert and oriented to person, place, and time. No cranial nerve deficit.  Most slowly but no focal neurologic deficit  Skin: Skin is warm and dry.  Vitals reviewed.    Diagnostic Tests: CT CHEST, ABDOMEN, AND PELVIS WITHOUT CONTRAST  TECHNIQUE: Multidetector CT imaging of the chest, abdomen, and pelvis was performed following the standard protocol without IV contrast.  COMPARISON: None.  FINDINGS: Lack of intravenous contrast. Oral contrast is only present in the colon an a few small bowel loops. These factors severely limits this examination. Metastatic disease can be missed by  this study.  Small mediastinal nodes. Three vessel coronary artery calcifications.  No pneumothorax. No pleural effusion.  Sub solid 10 mm opacity at the right apex. Spiculated and cavitary irregular lesion in the superior segment of the right lower lobe measures 1.6 x 2.8 cm on image 23. Vague ground-glass opacity in the right lower lobe on image 27. Left lung is clear.  The unenhanced liver, gallbladder, spleen, pancreas, adrenal glands are grossly within normal limits. Chronic changes of the kidneys which are lobulated. No obvious mass.  There is irregular wall thickening in this cecum. See image 80 of series 2. This may correspond to the patient's known mass in the cecum. The appendix is unremarkable. Terminal ileum is unremarkable. No obvious mesenteric adenopathy. No obvious retroperitoneal adenopathy by measurement criteria.  Calcified uterine fibroids. Bladder and adnexa are within normal limits.  No destructive bone lesion. Left hemi  laminectomy at L5-S1. Spinal stenosis at L4-5. Facet arthropathy throughout the lumbar spine. Severe degenerative disc disease at L5-S1 with vacuum.  IMPRESSION: Limited study as delineated above.  1.6 x 2.8 cm cavitary and spiculated lung mass in the superior segment of the right lower lobe. PET-CT is recommended.  There are ground-glass opacities and sub solid nodules in the right lung as described.  No obvious mediastinal adenopathy.  Wall thickening in the cecum which may correspond to the patient's known lesion.   Electronically Signed  By: Marybelle Killings M.D.  On: 07/08/2014 15:36 NUCLEAR MEDICINE PET SKULL BASE TO THIGH  TECHNIQUE: 11.32 mCi F-18 FDG was injected intravenously. Full-ring PET imaging was performed from the skull base to thigh after the radiotracer. CT data was obtained and used for attenuation correction and anatomic localization.  FASTING BLOOD GLUCOSE: Value: 119 mg/dl  COMPARISON: CT chest abdomen pelvis dated 07/08/2014  FINDINGS: NECK  No hypermetabolic lymph nodes in the neck.  Mild hypermetabolism within the bilateral thyroid gland, nonspecific.  CHEST  2.4 x 2.6 cm subsolid pulmonary nodule in the superior segment right lower lobe (series 6/ image 26), unchanged from prior CT chest, max SUV 2.8. This appearance is worrisome for primary bronchogenic neoplasm, likely adenocarcinoma. Metastatic disease related to cecal adenocarcinoma is considered unlikely given the appearance/configuration.  No evidence of hypermetabolic thoracic lymphadenopathy.  ABDOMEN/PELVIS  No abnormal hypermetabolic activity within the liver, pancreas, adrenal glands, or spleen.  No hypermetabolic lymph nodes in the abdomen or pelvis.  Status post right hemicolectomy with ileocecal anastomosis in the right mid abdomen. Associated hypermetabolism along the surgical anastomosis, max SUV 5.7.  Postsurgical changes along the  anterior abdominal wall with associated mild hypermetabolism, max SUV 5.5.  SKELETON  No focal hypermetabolic activity to suggest skeletal metastasis.  IMPRESSION: Status post right hemicolectomy.  No findings specific for metastatic disease. Associated hypermetabolism along the ileocolic anastomosis and along the anterior abdominal wall, postsurgical.  2.4 x 2.6 cm hypermetabolic subsolid pulmonary nodule in the superior segment right lower lobe, worrisome for primary bronchogenic neoplasm, likely adenocarcinoma. Metastatic disease is considered unlikely given the appearance/configuration. Consider percutaneous biopsy and/or surgical excision.   Electronically Signed  By: Julian Hy M.D.  On: 08/19/2014 12:42  Pulmonary function testing FVC 1.79 (72%) FEV1 1.27 (70%)  Impression: 74 year old woman with multiple medical problems as detailed above. She recently was hospitalized with a gastrointestinal bleed and required a hemicolectomy for colon cancer. During her hospitalization she was found to have a spiculated some solid nodule in the superior segment of her right lower lobe. This was a 2.4 x 2.6 cm  lesion that is hypermetabolic with an SUV max of 2.8.  I personally reviewed the CT and PET images and concur with the findings noted above.  In addition to the dominant nodule there are other smaller groundglass opacities in the right lung including one in the right lower lobe as well as one at the right apex.  Differential diagnosis for this includes infectious, inflammatory, and malignant etiologies. This is highly concerning for a primary bronchogenic carcinoma. It does not appear to be metastatic colon cancer. It has to be considered a primary lung cancer and was secured be proven otherwise.  I discussed on multiple potential options for diagnosis and treatment with Mrs. Tieszen. We discussed attempted bronchoscopic biopsy, CT-guided biopsy, versus surgical  resection. We discussed possibly treating with radiation therapy versus surgery.  I recommended a right VATS, superior segmentectomy of right lower lobe for definitive diagnosis and treatment of the nodule. I described the general nature of the procedure, including the incisions to be used, the need for general anesthesia, the expected hospital stay, and overall recovery. I reviewed the indications, risks, benefits, and alternatives. She understands the risks include, but are not limited to, death, MI, DVT, PE, bleeding, possible need for transfusion, stroke, prolonged air leak, infection, cardiac arrhythmias, as well as the possibility of unforeseeable complications.  She does state that she would be willing to accept blood transfusions in a life or death situation.  She understands the risks of surgery and wishes to proceed.  Plan: Right VATS, superior segmentectomy of right lower lobe on Monday, 09/08/2014  Melrose Nakayama, MD [facility] [phone]

## 2014-09-04 ENCOUNTER — Encounter (HOSPITAL_COMMUNITY)
Admission: RE | Admit: 2014-09-04 | Discharge: 2014-09-04 | Disposition: A | Discharge status: Home / Self Care | Payer: Medicare HMO | Source: Ambulatory Visit | Attending: Thoracic Surgery (Cardiothoracic Vascular Surgery) | Admitting: Thoracic Surgery (Cardiothoracic Vascular Surgery)

## 2014-09-04 ENCOUNTER — Encounter (HOSPITAL_COMMUNITY): Payer: Self-pay

## 2014-09-04 VITALS — BP 148/77 | HR 60 | Temp 98.7°F | Resp 20 | Ht 65.0 in | Wt 224.5 lb

## 2014-09-04 DIAGNOSIS — R911 Solitary pulmonary nodule: Secondary | ICD-10-CM

## 2014-09-04 HISTORY — DX: Headache, unspecified: R51.9

## 2014-09-04 HISTORY — DX: Headache: R51

## 2014-09-04 HISTORY — DX: Cerebral infarction, unspecified: I63.9

## 2014-09-04 HISTORY — DX: Malignant (primary) neoplasm, unspecified: C80.1

## 2014-09-04 LAB — COMPREHENSIVE METABOLIC PANEL
ALT: 11 U/L — ABNORMAL LOW (ref 14–54)
AST: 16 U/L (ref 15–41)
Albumin: 3.3 g/dL — ABNORMAL LOW (ref 3.5–5.0)
Alkaline Phosphatase: 78 U/L (ref 38–126)
Anion gap: 10 (ref 5–15)
BUN: 40 mg/dL — AB (ref 6–20)
CO2: 20 mmol/L — AB (ref 22–32)
Calcium: 8.9 mg/dL (ref 8.9–10.3)
Chloride: 108 mmol/L (ref 101–111)
Creatinine, Ser: 2.38 mg/dL — ABNORMAL HIGH (ref 0.44–1.00)
GFR calc Af Amer: 22 mL/min — ABNORMAL LOW (ref >60)
GFR, EST NON AFRICAN AMERICAN: 19 mL/min — AB (ref >60)
GLUCOSE: 174 mg/dL — AB (ref 65–99)
Potassium: 4 mmol/L (ref 3.5–5.1)
Sodium: 138 mmol/L (ref 135–145)
Total Bilirubin: 0.5 mg/dL (ref 0.3–1.2)
Total Protein: 6.9 g/dL (ref 6.5–8.1)

## 2014-09-04 LAB — BLOOD GAS, ARTERIAL
Acid-base deficit: 2.2 mmol/L — ABNORMAL HIGH (ref 0.0–2.0)
BICARBONATE: 22 meq/L (ref 20.0–24.0)
Drawn by: 206361
FIO2: 0.21 %
O2 Saturation: 97 %
PCO2 ART: 37.2 mmHg (ref 35.0–45.0)
PH ART: 7.389 (ref 7.350–7.450)
Patient temperature: 98.6
TCO2: 23.1 mmol/L (ref 0–100)
pO2, Arterial: 84.6 mmHg (ref 80.0–100.0)

## 2014-09-04 LAB — URINALYSIS, ROUTINE W REFLEX MICROSCOPIC
Bilirubin Urine: NEGATIVE
Glucose, UA: NEGATIVE mg/dL
HGB URINE DIPSTICK: NEGATIVE
Ketones, ur: NEGATIVE mg/dL
NITRITE: NEGATIVE
SPECIFIC GRAVITY, URINE: 1.019 (ref 1.005–1.030)
UROBILINOGEN UA: 0.2 mg/dL (ref 0.0–1.0)
pH: 5 (ref 5.0–8.0)

## 2014-09-04 LAB — URINE MICROSCOPIC-ADD ON

## 2014-09-04 LAB — CBC
HEMATOCRIT: 29.2 % — AB (ref 36.0–46.0)
HEMOGLOBIN: 9.3 g/dL — AB (ref 12.0–15.0)
MCH: 28 pg (ref 26.0–34.0)
MCHC: 31.8 g/dL (ref 30.0–36.0)
MCV: 88 fL (ref 78.0–100.0)
PLATELETS: 225 10*3/uL (ref 150–400)
RBC: 3.32 MIL/uL — ABNORMAL LOW (ref 3.87–5.11)
RDW: 13.1 % (ref 11.5–15.5)
WBC: 5.3 10*3/uL (ref 4.0–10.5)

## 2014-09-04 LAB — APTT: aPTT: 31 seconds (ref 24–37)

## 2014-09-04 LAB — SURGICAL PCR SCREEN
MRSA, PCR: NEGATIVE
Staphylococcus aureus: NEGATIVE

## 2014-09-04 LAB — PROTIME-INR
INR: 1.08 (ref 0.00–1.49)
Prothrombin Time: 14.1 seconds (ref 11.6–15.2)

## 2014-09-04 NOTE — Pre-Procedure Instructions (Signed)
Teresa Flowers  09/04/2014   Your procedure is scheduled on:  May16  Report to [place] at 530AM.  Call this number if you have problems the morning of surgery: [phone]   Remember:   Do not eat food or drink liquids after midnight.   Take these medicines the morning of surgery with A SIP OF WATER: Carvedilol (Coreg), Isosorbide mononitrate (Imdur), Synthroid  Stop taking Aspirin, Ibuprofen, Aleve, BC's, Goody's, Herbal medication, and Fish Oil   Do not wear jewelry, make-up or nail polish.  Do not wear lotions, powders, or perfumes. You may wear deodorant.  Do not shave 48 hours prior to surgery. Men may shave face and neck.  Do not bring valuables to the hospital.  [facility] is not responsible for any belongings or valuables.               Contacts, dentures or bridgework may not be worn into surgery.  Leave suitcase in the car. After surgery it may be brought to your room.  For patients admitted to the hospital, discharge time is determined by your  treatment team.               Patients discharged the day of surgery will not be allowed to drive home.    Special Instructions:[place] - Preparing for Surgery  Before surgery, you can play an important role.  Because skin is not sterile, your skin needs to be as free of germs as possible.  You can reduce the number of germs on you skin by washing with CHG (chlorahexidine gluconate) soap before surgery.  CHG is an antiseptic cleaner which kills germs and bonds with the skin to continue killing germs even after washing.  Please DO NOT use if you have an allergy to CHG or antibacterial soaps.  If your skin becomes reddened/irritated stop using the CHG and inform your nurse when you arrive at Short Stay.  Do not shave (including legs and underarms) for at least 48 hours prior to the first CHG shower.  You may shave your face.  Please follow these instructions carefully:   1.  Shower with CHG Soap the  night before surgery and the   morning of Surgery.  2.  If you choose to wash your hair, wash your hair first as usual with your  normal shampoo.  3.  After you shampoo, rinse your hair and body thoroughly to remove the   Shampoo.  4.  Use CHG as you would any other liquid soap.  You can apply chg directly  to the skin and wash gently with scrungie or a clean washcloth.  5.  Apply the CHG Soap to your body ONLY FROM THE NECK DOWN.   Do not use on open wounds or open sores.  Avoid contact with your eyes,  ears, mouth and genitals (private parts).  Wash genitals (private parts)       with your normal soap.  6.  Wash thoroughly, paying special attention to the area where your surgery  will be performed.  7.  Thoroughly rinse your body with warm water from the neck down.  8.  DO NOT shower/wash with your normal soap after using and rinsing off       the CHG Soap.  9.  Pat yourself dry with a clean towel.            10.  Wear clean pajamas.  11.  Place clean sheets on your bed the night of your first shower and do not        sleep with pets.  Day of Surgery  Do not apply any lotions/deoderants the morning of surgery.  Please wear clean clothes to the hospital/surgery center.      Please read over the following fact sheets that you were given: Pain Booklet, Coughing and Deep Breathing, Blood Transfusion Information, MRSA Information and Surgical Site Infection Prevention

## 2014-09-04 NOTE — Progress Notes (Signed)
PCP is Dr  Joetta Manners  Cardiologist is Dr Burt Knack Card cath noted in epic from 12-17-14 Echo and Stress test noted in epic from 2015  Denies chest pain Pt will need cxr on day of surgery. Reports CBG's run 150-200 most mornings.

## 2014-09-04 NOTE — Progress Notes (Signed)
Blood consent faxed to blood bank per request.  Pt will also need a type and screen day of surgery due to having a transfusion back in April.

## 2014-09-04 NOTE — Progress Notes (Addendum)
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 Type and screen done as ordered. She signed blood consent. Voices understanding of consent with teach back.

## 2014-09-05 LAB — HEMOGLOBIN A1C
HEMOGLOBIN A1C: 8.4 % — AB (ref 4.8–5.6)
MEAN PLASMA GLUCOSE: 194 mg/dL

## 2014-09-05 NOTE — Progress Notes (Signed)
Anesthesia Chart Review:  Patient is a 74 year old female scheduled for right VATS, RLL superior segmentectomy on 09/08/14 by Dr. Roxan Hockey.  History includes HTN, hypothyroidism, mild CAD by 2015 cath, hypercholesterolemia, DM2, CKD stage III, CVA,  She was hospitalized from 07/05/14-07/17/14 for GI bleed and underwent right hemi-colectomy for adenocarcinoma.  She was found to have a spiculated lung mass at that time. PCP is Dr. Joetta Manners with [facility].  Cardiologist is Dr. Burt Knack. Pulmonologist is Dr. Melvyn Novas.  Meds include Coreg, Lasix, Lantus, Humalog, Imdur, levothyroxine, lisinopril.  09/04/14 EKG: SB, LAD, LVH with repolarization abnormality.  12/16/13 Cardiac cath (Dr. Irish Lack): IMPRESSIONS: 1. Normal left main coronary artery. 2. Mild disease in the left anterior descending artery and its branches. 3. Mild disease in the left circumflex artery and its branches. 4. Mild disease in the right coronary artery as noted above. 5. Left ventricular systolic function was not assessed. LVEDP 7 mmHg. RECOMMENDATION: Continue preventative therapy. She needs aggressive risk factor modification. Minimal contrast dye was used. We will hydrate her post procedure to help preserve renal function. Cardiology followup with Dr. Johnsie Cancel.  10/29/13 Nuclear stress test [facility]; scanned under Media tab):  Myocardial perfusion imaging is mildly abnormal showing moderate fixed inferior attenuation artifact versus transmural scar. Overall LV systolic function was normal without regional wall motion abnormalities. LVEF 53%.  12/15/13 Echo: - Left ventricle: Basal septal hypertrophy with sigmoid shaped septum. The cavity size was normal. Systolic function was normal. The estimated ejection fraction was in the range of 55% to 60%. Wall motion was normal; there were no regional wall motion abnormalities. - Mitral valve: There was mild regurgitation. - Atrial septum: No defect or patent foramen ovale was  identified. Impressions: Normal study.  08/05/14 Pulmonary function testing: Mild restriction. FVC 1.79 (72%) FEV1 1.27 (69%)  Pre-operative labs noted. H/H 9.3/29.2, up from 9.0/26.7 on 07/17/14. BUN 40, Cr 2.38, up from her previous Cr range of ~ 1.7-1.9 in 06/2014. Glucose 174. PT/PTT WNL. A1C 8.4.   She is for CXR and T&S on the day of surgery. Of note, she is a Sales promotion account executive Witness but signed blood consent if needed for a life or death situation.  She received a transfusion back in 06/2014 (for HGB 4.3). Dr. Roxan Hockey is off today, but TCTS RN Ryan reviewed labs with covering surgeon Dr. Cyndia Bent. Will plan to get an ISTAT8 on the day of surgery to ensure her renal function is felt overall stable.   Definitive plan following patient evaluation and review of same day labs by her surgeon and anesthesiologist on Monday.  [facility] Short Stay Center/Anesthesiology Phone [phone] 09/05/2014 11:55 AM

## 2014-09-07 MED ORDER — DEXTROSE 5 % IV SOLN
1.5000 g | INTRAVENOUS | Status: AC
  Administered 2014-09-08: 1.5 g via INTRAVENOUS
  Filled 2014-09-07: qty 1.5

## 2014-09-08 ENCOUNTER — Inpatient Hospital Stay (HOSPITAL_COMMUNITY): Payer: Medicare HMO | Admitting: Vascular Surgery

## 2014-09-08 ENCOUNTER — Encounter (HOSPITAL_COMMUNITY)
Admission: RE | Disposition: A | Discharge status: Home / Self Care | Payer: Self-pay | Source: Ambulatory Visit | Attending: Thoracic Surgery (Cardiothoracic Vascular Surgery)

## 2014-09-08 ENCOUNTER — Inpatient Hospital Stay (HOSPITAL_COMMUNITY)
Admission: RE | Admit: 2014-09-08 | Discharge: 2014-09-13 | DRG: 164 | Disposition: A | Discharge status: Home / Self Care | Payer: Medicare HMO | Source: Ambulatory Visit | Attending: Thoracic Surgery (Cardiothoracic Vascular Surgery) | Admitting: Thoracic Surgery (Cardiothoracic Vascular Surgery)

## 2014-09-08 ENCOUNTER — Inpatient Hospital Stay (HOSPITAL_COMMUNITY): Payer: Medicare HMO

## 2014-09-08 ENCOUNTER — Inpatient Hospital Stay (HOSPITAL_COMMUNITY): Payer: Medicare HMO | Admitting: Anesthesiology

## 2014-09-08 ENCOUNTER — Encounter (HOSPITAL_COMMUNITY): Payer: Self-pay | Admitting: *Deleted

## 2014-09-08 DIAGNOSIS — E11649 Type 2 diabetes mellitus with hypoglycemia without coma: Secondary | ICD-10-CM | POA: Diagnosis not present

## 2014-09-08 DIAGNOSIS — Z85038 Personal history of other malignant neoplasm of large intestine: Secondary | ICD-10-CM | POA: Diagnosis not present

## 2014-09-08 DIAGNOSIS — Z01812 Encounter for preprocedural laboratory examination: Secondary | ICD-10-CM | POA: Diagnosis not present

## 2014-09-08 DIAGNOSIS — E1165 Type 2 diabetes mellitus with hyperglycemia: Secondary | ICD-10-CM | POA: Diagnosis present

## 2014-09-08 DIAGNOSIS — Z794 Long term (current) use of insulin: Secondary | ICD-10-CM

## 2014-09-08 DIAGNOSIS — E1122 Type 2 diabetes mellitus with diabetic chronic kidney disease: Secondary | ICD-10-CM | POA: Diagnosis present

## 2014-09-08 DIAGNOSIS — Z9049 Acquired absence of other specified parts of digestive tract: Secondary | ICD-10-CM | POA: Diagnosis present

## 2014-09-08 DIAGNOSIS — Z79899 Other long term (current) drug therapy: Secondary | ICD-10-CM

## 2014-09-08 DIAGNOSIS — E785 Hyperlipidemia, unspecified: Secondary | ICD-10-CM | POA: Diagnosis present

## 2014-09-08 DIAGNOSIS — Z8673 Personal history of transient ischemic attack (TIA), and cerebral infarction without residual deficits: Secondary | ICD-10-CM

## 2014-09-08 DIAGNOSIS — I2510 Atherosclerotic heart disease of native coronary artery without angina pectoris: Secondary | ICD-10-CM | POA: Diagnosis present

## 2014-09-08 DIAGNOSIS — Z902 Acquired absence of lung [part of]: Secondary | ICD-10-CM

## 2014-09-08 DIAGNOSIS — Z9889 Other specified postprocedural states: Secondary | ICD-10-CM

## 2014-09-08 DIAGNOSIS — D62 Acute posthemorrhagic anemia: Secondary | ICD-10-CM | POA: Diagnosis not present

## 2014-09-08 DIAGNOSIS — N183 Chronic kidney disease, stage 3 (moderate): Secondary | ICD-10-CM | POA: Diagnosis present

## 2014-09-08 DIAGNOSIS — D631 Anemia in chronic kidney disease: Secondary | ICD-10-CM | POA: Diagnosis present

## 2014-09-08 DIAGNOSIS — E039 Hypothyroidism, unspecified: Secondary | ICD-10-CM | POA: Diagnosis present

## 2014-09-08 DIAGNOSIS — Z6838 Body mass index (BMI) 38.0-38.9, adult: Secondary | ICD-10-CM | POA: Diagnosis not present

## 2014-09-08 DIAGNOSIS — Z01810 Encounter for preprocedural cardiovascular examination: Secondary | ICD-10-CM | POA: Diagnosis not present

## 2014-09-08 DIAGNOSIS — Z87891 Personal history of nicotine dependence: Secondary | ICD-10-CM | POA: Diagnosis not present

## 2014-09-08 DIAGNOSIS — N390 Urinary tract infection, site not specified: Secondary | ICD-10-CM | POA: Diagnosis not present

## 2014-09-08 DIAGNOSIS — R911 Solitary pulmonary nodule: Secondary | ICD-10-CM

## 2014-09-08 DIAGNOSIS — Z9689 Presence of other specified functional implants: Secondary | ICD-10-CM

## 2014-09-08 DIAGNOSIS — R918 Other nonspecific abnormal finding of lung field: Secondary | ICD-10-CM | POA: Diagnosis present

## 2014-09-08 DIAGNOSIS — I129 Hypertensive chronic kidney disease with stage 1 through stage 4 chronic kidney disease, or unspecified chronic kidney disease: Secondary | ICD-10-CM | POA: Diagnosis present

## 2014-09-08 DIAGNOSIS — Z4682 Encounter for fitting and adjustment of non-vascular catheter: Secondary | ICD-10-CM

## 2014-09-08 DIAGNOSIS — C3431 Malignant neoplasm of lower lobe, right bronchus or lung: Principal | ICD-10-CM | POA: Diagnosis present

## 2014-09-08 DIAGNOSIS — C3490 Malignant neoplasm of unspecified part of unspecified bronchus or lung: Secondary | ICD-10-CM

## 2014-09-08 HISTORY — PX: VIDEO ASSISTED THORACOSCOPY: SHX5073

## 2014-09-08 HISTORY — PX: SEGMENTECOMY: SHX5076

## 2014-09-08 LAB — POCT I-STAT, CHEM 8
BUN: 31 mg/dL — ABNORMAL HIGH (ref 6–20)
Calcium, Ion: 1.19 mmol/L (ref 1.13–1.30)
Chloride: 109 mmol/L (ref 101–111)
Creatinine, Ser: 1.9 mg/dL — ABNORMAL HIGH (ref 0.44–1.00)
Glucose, Bld: 205 mg/dL — ABNORMAL HIGH (ref 65–99)
HCT: 33 % — ABNORMAL LOW (ref 36.0–46.0)
Hemoglobin: 11.2 g/dL — ABNORMAL LOW (ref 12.0–15.0)
Potassium: 4.2 mmol/L (ref 3.5–5.1)
SODIUM: 141 mmol/L (ref 135–145)
TCO2: 19 mmol/L (ref 0–100)

## 2014-09-08 LAB — GLUCOSE, CAPILLARY
GLUCOSE-CAPILLARY: 230 mg/dL — AB (ref 65–99)
Glucose-Capillary: 182 mg/dL — ABNORMAL HIGH (ref 65–99)
Glucose-Capillary: 216 mg/dL — ABNORMAL HIGH (ref 65–99)
Glucose-Capillary: 270 mg/dL — ABNORMAL HIGH (ref 65–99)
Glucose-Capillary: 85 mg/dL (ref 65–99)

## 2014-09-08 LAB — PREPARE RBC (CROSSMATCH)

## 2014-09-08 SURGERY — VIDEO ASSISTED THORACOSCOPY
Anesthesia: General | Site: Chest | Laterality: Right

## 2014-09-08 MED ORDER — SODIUM CHLORIDE 0.9 % IJ SOLN: 9.0000 mL | INTRAMUSCULAR | Status: DC | PRN

## 2014-09-08 MED ORDER — ROCURONIUM BROMIDE 100 MG/10ML IV SOLN
INTRAVENOUS | Status: DC | PRN
  Administered 2014-09-08: 50 mg via INTRAVENOUS

## 2014-09-08 MED ORDER — ACETAMINOPHEN 500 MG PO TABS
1000.0000 mg | ORAL_TABLET | Freq: Four times a day (QID) | ORAL | Status: DC
  Administered 2014-09-08 – 2014-09-13 (×13): 1000 mg via ORAL
  Filled 2014-09-08 (×20): qty 2

## 2014-09-08 MED ORDER — OXYCODONE HCL 5 MG/5ML PO SOLN: 5.0000 mg | Freq: Once | ORAL | Status: DC | PRN

## 2014-09-08 MED ORDER — HYDRALAZINE HCL 20 MG/ML IJ SOLN
INTRAMUSCULAR | Status: AC
Start: 2014-09-08 — End: 2014-09-09
  Administered 2014-09-09: 10 mg via INTRAVENOUS
  Filled 2014-09-08: qty 1

## 2014-09-08 MED ORDER — ONDANSETRON HCL 4 MG/2ML IJ SOLN: 4.0000 mg | Freq: Four times a day (QID) | INTRAMUSCULAR | Status: DC | PRN

## 2014-09-08 MED ORDER — BUPIVACAINE HCL (PF) 0.5 % IJ SOLN
INTRAMUSCULAR | Status: DC | PRN
  Administered 2014-09-08: 5 mL

## 2014-09-08 MED ORDER — NEOSTIGMINE METHYLSULFATE 10 MG/10ML IV SOLN
INTRAVENOUS | Status: DC | PRN
Start: 2014-09-08 — End: 2014-09-08
  Administered 2014-09-08: 5 mg via INTRAVENOUS

## 2014-09-08 MED ORDER — LEVOTHYROXINE SODIUM 88 MCG PO TABS
88.0000 ug | ORAL_TABLET | Freq: Every day | ORAL | Status: DC
  Administered 2014-09-09 – 2014-09-13 (×5): 88 ug via ORAL
  Filled 2014-09-08 (×6): qty 1

## 2014-09-08 MED ORDER — GLYCOPYRROLATE 0.2 MG/ML IJ SOLN
INTRAMUSCULAR | Status: AC
  Filled 2014-09-08: qty 1

## 2014-09-08 MED ORDER — SODIUM CHLORIDE 0.9 % IJ SOLN
INTRAMUSCULAR | Status: AC
  Filled 2014-09-08: qty 10

## 2014-09-08 MED ORDER — SUCCINYLCHOLINE CHLORIDE 20 MG/ML IJ SOLN
INTRAMUSCULAR | Status: AC
  Filled 2014-09-08: qty 1

## 2014-09-08 MED ORDER — NALOXONE HCL 0.4 MG/ML IJ SOLN: 0.4000 mg | INTRAMUSCULAR | Status: DC | PRN

## 2014-09-08 MED ORDER — FENTANYL CITRATE (PF) 250 MCG/5ML IJ SOLN
INTRAMUSCULAR | Status: AC
  Filled 2014-09-08: qty 5

## 2014-09-08 MED ORDER — LIDOCAINE HCL (CARDIAC) 20 MG/ML IV SOLN
INTRAVENOUS | Status: AC
  Filled 2014-09-08: qty 5

## 2014-09-08 MED ORDER — PROPOFOL 10 MG/ML IV BOLUS
INTRAVENOUS | Status: DC | PRN
  Administered 2014-09-08: 200 mg via INTRAVENOUS

## 2014-09-08 MED ORDER — ROCURONIUM BROMIDE 50 MG/5ML IV SOLN
INTRAVENOUS | Status: AC
  Filled 2014-09-08: qty 1

## 2014-09-08 MED ORDER — CARVEDILOL 25 MG PO TABS
25.0000 mg | ORAL_TABLET | Freq: Two times a day (BID) | ORAL | Status: DC
  Administered 2014-09-09 – 2014-09-13 (×9): 25 mg via ORAL
  Filled 2014-09-08 (×12): qty 1

## 2014-09-08 MED ORDER — DIPHENHYDRAMINE HCL 50 MG/ML IJ SOLN: 12.5000 mg | Freq: Four times a day (QID) | INTRAMUSCULAR | Status: DC | PRN

## 2014-09-08 MED ORDER — VECURONIUM BROMIDE 10 MG IV SOLR
INTRAVENOUS | Status: DC | PRN
  Administered 2014-09-08 (×2): 1 mg via INTRAVENOUS
  Administered 2014-09-08: 2 mg via INTRAVENOUS

## 2014-09-08 MED ORDER — GLYCOPYRROLATE 0.2 MG/ML IJ SOLN
INTRAMUSCULAR | Status: DC | PRN
  Administered 2014-09-08: .8 mg via INTRAVENOUS
  Administered 2014-09-08 (×2): 0.1 mg via INTRAVENOUS

## 2014-09-08 MED ORDER — TRAMADOL HCL 50 MG PO TABS: 50.0000 mg | ORAL_TABLET | Freq: Four times a day (QID) | ORAL | Status: DC | PRN

## 2014-09-08 MED ORDER — SODIUM CHLORIDE 0.9 % IV SOLN
INTRAVENOUS | Status: DC
  Administered 2014-09-08: 100 mL/h via INTRAVENOUS
  Administered 2014-09-09 – 2014-09-10 (×2): via INTRAVENOUS

## 2014-09-08 MED ORDER — SENNOSIDES-DOCUSATE SODIUM 8.6-50 MG PO TABS
1.0000 | ORAL_TABLET | Freq: Every day | ORAL | Status: DC
  Administered 2014-09-08 – 2014-09-12 (×4): 1 via ORAL
  Filled 2014-09-08 (×6): qty 1

## 2014-09-08 MED ORDER — CARVEDILOL 12.5 MG PO TABS: 25.0000 mg | ORAL_TABLET | Freq: Once | ORAL | Status: DC

## 2014-09-08 MED ORDER — 0.9 % SODIUM CHLORIDE (POUR BTL) OPTIME
TOPICAL | Status: DC | PRN
  Administered 2014-09-08: 2000 mL

## 2014-09-08 MED ORDER — ONDANSETRON HCL 4 MG/2ML IJ SOLN
INTRAMUSCULAR | Status: DC | PRN
  Administered 2014-09-08: 4 mg via INTRAVENOUS

## 2014-09-08 MED ORDER — NEOSTIGMINE METHYLSULFATE 10 MG/10ML IV SOLN
INTRAVENOUS | Status: AC
  Filled 2014-09-08: qty 1

## 2014-09-08 MED ORDER — POTASSIUM CHLORIDE 10 MEQ/50ML IV SOLN: 10.0000 meq | Freq: Every day | INTRAVENOUS | Status: DC | PRN

## 2014-09-08 MED ORDER — FENTANYL CITRATE (PF) 250 MCG/5ML IJ SOLN
INTRAMUSCULAR | Status: DC | PRN
  Administered 2014-09-08: 50 ug via INTRAVENOUS
  Administered 2014-09-08: 100 ug via INTRAVENOUS
  Administered 2014-09-08: 50 ug via INTRAVENOUS
  Administered 2014-09-08: 100 ug via INTRAVENOUS

## 2014-09-08 MED ORDER — ARTIFICIAL TEARS OP OINT
TOPICAL_OINTMENT | OPHTHALMIC | Status: AC
  Filled 2014-09-08: qty 3.5

## 2014-09-08 MED ORDER — PHENYLEPHRINE 40 MCG/ML (10ML) SYRINGE FOR IV PUSH (FOR BLOOD PRESSURE SUPPORT)
PREFILLED_SYRINGE | INTRAVENOUS | Status: AC
  Filled 2014-09-08: qty 10

## 2014-09-08 MED ORDER — DEXTROSE 5 % IV SOLN
1.5000 g | Freq: Two times a day (BID) | INTRAVENOUS | Status: AC
  Administered 2014-09-08 – 2014-09-09 (×2): 1.5 g via INTRAVENOUS
  Filled 2014-09-08 (×2): qty 1.5

## 2014-09-08 MED ORDER — BUPIVACAINE 0.5 % ON-Q PUMP SINGLE CATH 400 ML
400.0000 mL | INJECTION | Status: DC
  Filled 2014-09-08: qty 400

## 2014-09-08 MED ORDER — ONDANSETRON HCL 4 MG/2ML IJ SOLN
INTRAMUSCULAR | Status: AC
  Filled 2014-09-08: qty 2

## 2014-09-08 MED ORDER — VECURONIUM BROMIDE 10 MG IV SOLR
INTRAVENOUS | Status: AC
  Filled 2014-09-08: qty 10

## 2014-09-08 MED ORDER — HEMOSTATIC AGENTS (NO CHARGE) OPTIME
TOPICAL | Status: DC | PRN
  Administered 2014-09-08: 1 via TOPICAL

## 2014-09-08 MED ORDER — ACETAMINOPHEN 160 MG/5ML PO SOLN
1000.0000 mg | Freq: Four times a day (QID) | ORAL | Status: DC
  Filled 2014-09-08: qty 40

## 2014-09-08 MED ORDER — BUPIVACAINE 0.25 % ON-Q PUMP SINGLE CATH 400 ML
400.0000 mL | INJECTION | Status: DC
  Filled 2014-09-08: qty 400

## 2014-09-08 MED ORDER — LISINOPRIL 20 MG PO TABS
20.0000 mg | ORAL_TABLET | Freq: Every day | ORAL | Status: DC
  Administered 2014-09-09 – 2014-09-12 (×4): 20 mg via ORAL
  Filled 2014-09-08 (×6): qty 1

## 2014-09-08 MED ORDER — OXYCODONE HCL 5 MG PO TABS: 5.0000 mg | ORAL_TABLET | Freq: Once | ORAL | Status: DC | PRN

## 2014-09-08 MED ORDER — LACTATED RINGERS IV SOLN
INTRAVENOUS | Status: DC | PRN
  Administered 2014-09-08: 07:00:00 via INTRAVENOUS

## 2014-09-08 MED ORDER — FENTANYL 10 MCG/ML IV SOLN
INTRAVENOUS | Status: DC
  Administered 2014-09-08: 0 ug via INTRAVENOUS
  Administered 2014-09-08: 12:00:00 via INTRAVENOUS
  Administered 2014-09-09: 10 ug via INTRAVENOUS
  Administered 2014-09-09: 20 ug via INTRAVENOUS
  Administered 2014-09-09: 10 ug via INTRAVENOUS
  Administered 2014-09-10: 20 ug via INTRAVENOUS
  Administered 2014-09-13: 0 ug via INTRAVENOUS
  Filled 2014-09-08: qty 50

## 2014-09-08 MED ORDER — MIDAZOLAM HCL 5 MG/5ML IJ SOLN
INTRAMUSCULAR | Status: DC | PRN
  Administered 2014-09-08: 1 mg via INTRAVENOUS

## 2014-09-08 MED ORDER — DIPHENHYDRAMINE HCL 12.5 MG/5ML PO ELIX
12.5000 mg | ORAL_SOLUTION | Freq: Four times a day (QID) | ORAL | Status: DC | PRN
  Filled 2014-09-08: qty 5

## 2014-09-08 MED ORDER — LABETALOL HCL 5 MG/ML IV SOLN
10.0000 mg | INTRAVENOUS | Status: DC | PRN
  Administered 2014-09-09 – 2014-09-12 (×2): 10 mg via INTRAVENOUS
  Filled 2014-09-08 (×2): qty 4

## 2014-09-08 MED ORDER — HYDRALAZINE HCL 20 MG/ML IJ SOLN
INTRAMUSCULAR | Status: DC | PRN
  Administered 2014-09-08 (×2): 5 mg via INTRAVENOUS
  Administered 2014-09-08: 10 mg via INTRAVENOUS

## 2014-09-08 MED ORDER — HYDRALAZINE HCL 20 MG/ML IJ SOLN
10.0000 mg | Freq: Once | INTRAMUSCULAR | Status: AC
  Administered 2014-09-08: 10 mg via INTRAVENOUS

## 2014-09-08 MED ORDER — NITROGLYCERIN 0.2 MG/ML ON CALL CATH LAB
INTRAVENOUS | Status: DC | PRN
  Administered 2014-09-08: 80 ug via INTRAVENOUS

## 2014-09-08 MED ORDER — CARVEDILOL 12.5 MG PO TABS
ORAL_TABLET | ORAL | Status: AC
  Administered 2014-09-08: 25 mg
  Filled 2014-09-08: qty 2

## 2014-09-08 MED ORDER — INSULIN ASPART 100 UNIT/ML ~~LOC~~ SOLN
SUBCUTANEOUS | Status: AC
  Filled 2014-09-08: qty 1

## 2014-09-08 MED ORDER — OXYCODONE HCL 5 MG PO TABS
5.0000 mg | ORAL_TABLET | ORAL | Status: DC | PRN
  Administered 2014-09-12 (×2): 10 mg via ORAL
  Filled 2014-09-08 (×2): qty 2

## 2014-09-08 MED ORDER — INSULIN ASPART 100 UNIT/ML ~~LOC~~ SOLN
0.0000 [IU] | SUBCUTANEOUS | Status: DC
  Administered 2014-09-08: 12 [IU] via SUBCUTANEOUS
  Administered 2014-09-08: 8 [IU] via SUBCUTANEOUS
  Administered 2014-09-09: 2 [IU] via SUBCUTANEOUS

## 2014-09-08 MED ORDER — BUPIVACAINE ON-Q PAIN PUMP (FOR ORDER SET NO CHG)
INJECTION | Status: DC
  Filled 2014-09-08: qty 1

## 2014-09-08 MED ORDER — MIDAZOLAM HCL 2 MG/2ML IJ SOLN
INTRAMUSCULAR | Status: AC
  Filled 2014-09-08: qty 2

## 2014-09-08 MED ORDER — BUPIVACAINE 0.5 % ON-Q PUMP SINGLE CATH 400 ML
INJECTION | Status: DC | PRN
  Administered 2014-09-08: 400 mL

## 2014-09-08 MED ORDER — EPHEDRINE SULFATE 50 MG/ML IJ SOLN
INTRAMUSCULAR | Status: AC
  Filled 2014-09-08: qty 1

## 2014-09-08 MED ORDER — HYDROMORPHONE HCL 1 MG/ML IJ SOLN
INTRAMUSCULAR | Status: AC
  Filled 2014-09-08: qty 2

## 2014-09-08 MED ORDER — CETYLPYRIDINIUM CHLORIDE 0.05 % MT LIQD
7.0000 mL | Freq: Two times a day (BID) | OROMUCOSAL | Status: DC
  Administered 2014-09-08: 7 mL via OROMUCOSAL

## 2014-09-08 MED ORDER — BISACODYL 5 MG PO TBEC
10.0000 mg | DELAYED_RELEASE_TABLET | Freq: Every day | ORAL | Status: DC
  Administered 2014-09-09 – 2014-09-13 (×3): 10 mg via ORAL
  Filled 2014-09-08 (×3): qty 2

## 2014-09-08 MED ORDER — ARTIFICIAL TEARS OP OINT
TOPICAL_OINTMENT | OPHTHALMIC | Status: DC | PRN
  Administered 2014-09-08: 1 via OPHTHALMIC

## 2014-09-08 MED ORDER — GLYCOPYRROLATE 0.2 MG/ML IJ SOLN
INTRAMUSCULAR | Status: AC
  Filled 2014-09-08: qty 3

## 2014-09-08 MED ORDER — PROPOFOL 10 MG/ML IV BOLUS
INTRAVENOUS | Status: AC
  Filled 2014-09-08: qty 20

## 2014-09-08 MED ORDER — HYDROMORPHONE HCL 1 MG/ML IJ SOLN: 0.2500 mg | INTRAMUSCULAR | Status: DC | PRN

## 2014-09-08 MED ORDER — ISOSORBIDE MONONITRATE ER 30 MG PO TB24
30.0000 mg | ORAL_TABLET | Freq: Every day | ORAL | Status: DC
  Administered 2014-09-09 – 2014-09-12 (×4): 30 mg via ORAL
  Filled 2014-09-08 (×5): qty 1

## 2014-09-08 MED ORDER — DEXTROSE 50 % IV SOLN
INTRAVENOUS | Status: AC
  Administered 2014-09-08: 25 mL via INTRAVENOUS
  Filled 2014-09-08: qty 50

## 2014-09-08 MED ORDER — HYDROMORPHONE HCL 1 MG/ML IJ SOLN
0.2500 mg | INTRAMUSCULAR | Status: DC | PRN
  Administered 2014-09-08: 0.5 mg via INTRAVENOUS

## 2014-09-08 MED ORDER — HYDRALAZINE HCL 20 MG/ML IJ SOLN
10.0000 mg | INTRAMUSCULAR | Status: DC | PRN
  Administered 2014-09-08 – 2014-09-09 (×2): 10 mg via INTRAVENOUS
  Filled 2014-09-08 (×3): qty 1

## 2014-09-08 MED ORDER — DEXTROSE 50 % IV SOLN
25.0000 mL | INTRAVENOUS | Status: AC
  Administered 2014-09-08: 25 mL via INTRAVENOUS

## 2014-09-08 SURGICAL SUPPLY — 88 items
ADH SKN CLS APL DERMABOND .7 (GAUZE/BANDAGES/DRESSINGS)
APPLIER CLIP ROT 10 11.4 M/L (STAPLE)
APR CLP MED LRG 11.4X10 (STAPLE)
ATTRACTOMAT 16X20 MAGNETIC DRP (DRAPES) ×2 IMPLANT
BAG SPEC RTRVL LRG 6X4 10 (ENDOMECHANICALS) ×1
CANISTER SUCTION 2500CC (MISCELLANEOUS) ×3 IMPLANT
CATH KIT ON Q 5IN SLV (PAIN MANAGEMENT) IMPLANT
CATH KIT ON-Q SILVERSOAK 5 (CATHETERS) IMPLANT
CATH KIT ON-Q SILVERSOAK 5IN (CATHETERS) ×3 IMPLANT
CATH THORACIC 28FR (CATHETERS) ×2 IMPLANT
CATH THORACIC 28FR RT ANG (CATHETERS) IMPLANT
CATH THORACIC 36FR (CATHETERS) IMPLANT
CATH THORACIC 36FR RT ANG (CATHETERS) IMPLANT
CLIP APPLIE ROT 10 11.4 M/L (STAPLE) IMPLANT
CLIP TI MEDIUM 6 (CLIP) IMPLANT
CONN ST 1/4X3/8  BEN (MISCELLANEOUS) ×2
CONN ST 1/4X3/8 BEN (MISCELLANEOUS) IMPLANT
CONN Y 3/8X3/8X3/8  BEN (MISCELLANEOUS) ×2
CONN Y 3/8X3/8X3/8 BEN (MISCELLANEOUS) ×1 IMPLANT
CONT SPEC 4OZ CLIKSEAL STRL BL (MISCELLANEOUS) ×8 IMPLANT
COVER SURGICAL LIGHT HANDLE (MISCELLANEOUS) ×3 IMPLANT
CUTTER ECHEON FLEX ENDO 45 340 (ENDOMECHANICALS) ×2 IMPLANT
DERMABOND ADVANCED (GAUZE/BANDAGES/DRESSINGS)
DERMABOND ADVANCED .7 DNX12 (GAUZE/BANDAGES/DRESSINGS) IMPLANT
DRAIN CHANNEL 28F RND 3/8 FF (WOUND CARE) ×2 IMPLANT
DRAIN CHANNEL 32F RND 10.7 FF (WOUND CARE) IMPLANT
DRAPE LAPAROSCOPIC ABDOMINAL (DRAPES) ×3 IMPLANT
DRAPE WARM FLUID 44X44 (DRAPE) ×1 IMPLANT
ELECT REM PT RETURN 9FT ADLT (ELECTROSURGICAL) ×3
ELECTRODE REM PT RTRN 9FT ADLT (ELECTROSURGICAL) ×1 IMPLANT
GAUZE SPONGE 4X4 12PLY STRL (GAUZE/BANDAGES/DRESSINGS) ×3 IMPLANT
GLOVE BIO SURGEON STRL SZ7.5 (GLOVE) ×2 IMPLANT
GLOVE BIOGEL PI IND STRL 6.5 (GLOVE) IMPLANT
GLOVE BIOGEL PI INDICATOR 6.5 (GLOVE) ×4
GLOVE SURG SIGNA 7.5 PF LTX (GLOVE) ×6 IMPLANT
GLOVE SURG SS PI 7.0 STRL IVOR (GLOVE) ×4 IMPLANT
GOWN STRL REUS W/ TWL LRG LVL3 (GOWN DISPOSABLE) ×2 IMPLANT
GOWN STRL REUS W/ TWL XL LVL3 (GOWN DISPOSABLE) ×1 IMPLANT
GOWN STRL REUS W/TWL LRG LVL3 (GOWN DISPOSABLE) ×9
GOWN STRL REUS W/TWL XL LVL3 (GOWN DISPOSABLE) ×3
HEMOSTAT SURGICEL 2X14 (HEMOSTASIS) ×2 IMPLANT
KIT BASIN OR (CUSTOM PROCEDURE TRAY) ×3 IMPLANT
KIT ROOM TURNOVER OR (KITS) ×3 IMPLANT
KIT SUCTION CATH 14FR (SUCTIONS) ×1 IMPLANT
LIQUID BAND (GAUZE/BANDAGES/DRESSINGS) ×2 IMPLANT
NS IRRIG 1000ML POUR BTL (IV SOLUTION) ×6 IMPLANT
PACK CHEST (CUSTOM PROCEDURE TRAY) ×3 IMPLANT
PAD ARMBOARD 7.5X6 YLW CONV (MISCELLANEOUS) ×6 IMPLANT
POUCH ENDO CATCH II 15MM (MISCELLANEOUS) IMPLANT
POUCH SPECIMEN RETRIEVAL 10MM (ENDOMECHANICALS) ×2 IMPLANT
RELOAD GOLD ECHELON 45 (STAPLE) ×14 IMPLANT
RELOAD GREEN ECHELON 45 (STAPLE) ×2 IMPLANT
RELOAD STAPLE 35X2.5 WHT THIN (STAPLE) IMPLANT
SEALANT PROGEL (MISCELLANEOUS) IMPLANT
SEALANT SURG COSEAL 4ML (VASCULAR PRODUCTS) IMPLANT
SEALANT SURG COSEAL 8ML (VASCULAR PRODUCTS) IMPLANT
SOLUTION ANTI FOG 6CC (MISCELLANEOUS) ×3 IMPLANT
SPECIMEN JAR MEDIUM (MISCELLANEOUS) ×3 IMPLANT
SPONGE GAUZE 4X4 12PLY STER LF (GAUZE/BANDAGES/DRESSINGS) ×2 IMPLANT
SPONGE INTESTINAL PEANUT (DISPOSABLE) ×2 IMPLANT
STAPLE RELOAD 2.5MM WHITE (STAPLE) ×6 IMPLANT
STAPLER VASCULAR ECHELON 35 (CUTTER) ×2 IMPLANT
SUT PROLENE 4 0 RB 1 (SUTURE)
SUT PROLENE 4-0 RB1 .5 CRCL 36 (SUTURE) IMPLANT
SUT SILK  1 MH (SUTURE) ×8
SUT SILK 1 MH (SUTURE) ×2 IMPLANT
SUT SILK 2 0SH CR/8 30 (SUTURE) IMPLANT
SUT SILK 3 0SH CR/8 30 (SUTURE) ×2 IMPLANT
SUT VIC AB 1 CTX 36 (SUTURE) ×3
SUT VIC AB 1 CTX36XBRD ANBCTR (SUTURE) IMPLANT
SUT VIC AB 2-0 CTX 36 (SUTURE) ×2 IMPLANT
SUT VIC AB 2-0 UR6 27 (SUTURE) ×2 IMPLANT
SUT VIC AB 3-0 MH 27 (SUTURE) IMPLANT
SUT VIC AB 3-0 X1 27 (SUTURE) ×3 IMPLANT
SUT VICRYL 2 TP 1 (SUTURE) IMPLANT
SWAB COLLECTION DEVICE MRSA (MISCELLANEOUS) IMPLANT
SYSTEM SAHARA CHEST DRAIN ATS (WOUND CARE) ×3 IMPLANT
TAPE CLOTH 4X10 WHT NS (GAUZE/BANDAGES/DRESSINGS) ×3 IMPLANT
TAPE CLOTH SURG 4X10 WHT LF (GAUZE/BANDAGES/DRESSINGS) ×2 IMPLANT
TIP APPLICATOR SPRAY EXTEND 16 (VASCULAR PRODUCTS) IMPLANT
TOWEL OR 17X24 6PK STRL BLUE (TOWEL DISPOSABLE) ×3 IMPLANT
TOWEL OR 17X26 10 PK STRL BLUE (TOWEL DISPOSABLE) ×6 IMPLANT
TRAP SPECIMEN MUCOUS 40CC (MISCELLANEOUS) IMPLANT
TRAY FOLEY CATH 16FRSI W/METER (SET/KITS/TRAYS/PACK) ×3 IMPLANT
TROCAR XCEL NON-BLD 5MMX100MML (ENDOMECHANICALS) IMPLANT
TUBE ANAEROBIC SPECIMEN COL (MISCELLANEOUS) IMPLANT
TUNNELER SHEATH ON-Q 11GX8 DSP (PAIN MANAGEMENT) IMPLANT
WATER STERILE IRR 1000ML POUR (IV SOLUTION) ×6 IMPLANT

## 2014-09-08 NOTE — Progress Notes (Signed)
Utilization review completed. Shaarav Ripple, RN, BSN. 

## 2014-09-08 NOTE — Transfer of Care (Signed)
Immediate Anesthesia Transfer of Care Note  Patient: Teresa Flowers  Procedure(s) Performed: Procedure(s): RIGHT VIDEO ASSISTED THORACOSCOPY (Right) RIGHT LOWER LOBE SUPERIOR SEGMENTECTOMY (Right)  Patient Location: PACU  Anesthesia Type:General  Level of Consciousness: awake  Airway & Oxygen Therapy: Patient Spontanous Breathing and Patient connected to face mask oxygen  Post-op Assessment: Report given to RN  Post vital signs: stable  Last Vitals:  Filed Vitals:   09/08/14 0555  BP: 218/90  Pulse:   Temp:   Resp:     Complications: No apparent anesthesia complications

## 2014-09-08 NOTE — Brief Op Note (Addendum)
09/08/2014      [address]       [place] [address]             [phone]     09/08/2014  11:04 AM  PATIENT:  Teresa Flowers  74 y.o. female  PRE-OPERATIVE DIAGNOSIS:  LUNG NODULE SUPERIOR SEGMENT RIGHT LOWER LOBE  POST-OPERATIVE DIAGNOSIS:  NON-SMALL CELL CARCINOMA SUPERIOR SEGMENT RIGHT LOWER LOBE  CLINICAL STAGE IA  PROCEDURE:  Procedure(s): RIGHT VIDEO ASSISTED THORACOSCOPY THORACOSCOPIC RIGHT LOWER LOBE SUPERIOR SEGMENTECTOMY MEDIASTINAL LYMPH NODE DISSECTION ON-Q LOCAL ANESTHETIC CATHETER PLACEMENT  SURGEON:  Surgeon(s): Melrose Nakayama, MD  PHYSICIAN ASSISTANT: WAYNE GOLD PA-C  ANESTHESIA:   general  SPECIMEN:  Source of Specimen:  RLL SUPERIOR SEGMENTECTOMY, MULTIPLE LN'S  DISPOSITION OF SPECIMEN:  Pathology  DRAINS: 2  Chest Tube(s) in the RIGHT HEMITHORAX   PATIENT CONDITION: PACU, EXTUBATED AND STABLE  PRE-OPERATIVE WEIGHT: 101kg  FROZEN: NON-SMALL CELL, C/W PROBABLE ADENOCARCINOMA  EBL [id number]  COMPLICATIONS: NO KNOWN

## 2014-09-08 NOTE — Anesthesia Preprocedure Evaluation (Signed)
Anesthesia Evaluation  Patient identified by MRN, date of birth, ID band Patient awake    Reviewed: Allergy & Precautions, NPO status , Patient's Chart, lab work & pertinent test results  Airway Mallampati: II   Neck ROM: full    Dental   Pulmonary former smoker,  breath sounds clear to auscultation        Cardiovascular hypertension, + CAD Rhythm:regular Rate:Normal     Neuro/Psych  Headaches, TIACVA    GI/Hepatic GERD-  ,  Endo/Other  diabetes, Type 2Hypothyroidism Morbid obesity  Renal/GU Renal InsufficiencyRenal disease     Musculoskeletal   Abdominal   Peds  Hematology   Anesthesia Other Findings   Reproductive/Obstetrics                             Anesthesia Physical Anesthesia Plan  ASA: III  Anesthesia Plan: General   Post-op Pain Management:    Induction: Intravenous  Airway Management Planned: Double Lumen EBT  Additional Equipment: Arterial line and CVP  Intra-op Plan:   Post-operative Plan: Extubation in OR  Informed Consent: I have reviewed the patients History and Physical, chart, labs and discussed the procedure including the risks, benefits and alternatives for the proposed anesthesia with the patient or authorized representative who has indicated his/her understanding and acceptance.     Plan Discussed with: CRNA, Anesthesiologist and Surgeon  Anesthesia Plan Comments:         Anesthesia Quick Evaluation

## 2014-09-08 NOTE — Anesthesia Postprocedure Evaluation (Signed)
Anesthesia Post Note  Patient: Teresa Flowers  Procedure(s) Performed: Procedure(s) (LRB): RIGHT VIDEO ASSISTED THORACOSCOPY (Right) RIGHT LOWER LOBE SUPERIOR SEGMENTECTOMY (Right)  Anesthesia type: General  Patient location: PACU  Post pain: Pain level controlled and Adequate analgesia  Post assessment: Post-op Vital signs reviewed, Patient's Cardiovascular Status Stable, Respiratory Function Stable, Patent Airway and Pain level controlled  Last Vitals:  Filed Vitals:   09/08/14 1230  BP: 169/68  Pulse: 66  Temp:   Resp:     Post vital signs: Reviewed and stable  Level of consciousness: awake, alert  and oriented  Complications: No apparent anesthesia complications

## 2014-09-08 NOTE — H&P (View-Only) (Signed)
PCP is ASRES,ALEHEGN, MD Referring Provider is Tanda Rockers, MD  Chief Complaint  Patient presents with  . Lung Mass    Surgical eval, Chest CT 07/08/14, PET Scan 08/19/14    HPI: 74 year old woman sent for consultation regarding a right lower lobe nodule.  Teresa Flowers is a 74 year old Teresa Flowers with a past medical history significant for hypertension, hypothyroidism, hyperlipidemia, complicated type 2 diabetes, stage III chronic kidney disease, prior stroke, cerebrovascular disease, obesity, reflux, and colon cancer.   She was admitted in March with acute GI bleeding. She was diagnosed with colon cancer. She underwent a right hemicolectomy on 07/09/2014. She was discharged from the hospital on 07/17/2014. During her hospitalization she was found to have a nodule in the superior segment of her right lower lobe. This was confirmed with a CT scan which also showed other groundglass opacities, but the dominant lesion was in the right lower lobe. She recently saw Dr. Melvyn Novas. A PET CT showed the lesion to be mildly hypermetabolic with an SUV of 2.8. There is no evidence of regional or distant metastases.  She says that she is recovered well from her surgery. Her appetite is good. She's not sure she may have lost a few pounds, but denies significant weight loss. She's not having any chest pain, pressure, or tightness. She denies shortness of breath or wheezing. She has no occasional cough and sneezing due to allergies. She denies hemoptysis. Says she has not had any further GI bleeding since her surgery.  Of note she did consent to and have blood transfusions during her last admission.    Past Medical History  Diagnosis Date  . Essential hypertension   . Vertigo   . Hypothyroid   . High cholesterol   . Type 2 diabetes mellitus, uncontrolled, with renal complications   . Anemia in chronic kidney disease   . Stage III chronic kidney disease   . Cerebrovascular disease     prior  infarcts seen on head CT per patient  . Obesity   . Hypertensive urgency 12/16/2013  . Refusal of blood product   . TIA (transient ischemic attack)   . GERD (gastroesophageal reflux disease)   . Intermediate coronary syndrome     Past Surgical History  Procedure Laterality Date  . Cardiovascular stress test  10/29/13    mildly abnoraml adenosine myoview EF 53%  . Colonoscopy    . Cyst excision    . Left heart catheterization with coronary angiogram N/A 12/16/2013    Procedure: LEFT HEART CATHETERIZATION WITH CORONARY ANGIOGRAM;  Surgeon: Jettie Booze, MD;  Location: [facility] CATH LAB;  Service: Cardiovascular;  Laterality: N/A;  . Esophagogastroduodenoscopy N/A 07/06/2014    Procedure: ESOPHAGOGASTRODUODENOSCOPY (EGD);  Surgeon: Jerene Bears, MD;  Location: [facility] ENDOSCOPY;  Service: Endoscopy;  Laterality: N/A;  . Colonoscopy N/A 07/07/2014    Procedure: COLONOSCOPY;  Surgeon: Irene Shipper, MD;  Location: [place];  Service: Endoscopy;  Laterality: N/A;  MAC also okay  . Laparoscopic partial colectomy N/A 07/09/2014    Procedure: LAPAROSCOPIC ASSISTED PARTIAL COLECTOMY;  Surgeon: Coralie Keens, MD;  Location: [facility] OR;  Service: General;  Laterality: N/A;    Family History  Problem Relation Age of Onset  . Heart disease Mother   . Transient ischemic attack Brother   . Heart attack Daughter     Social History History  Substance Use Topics  . Smoking status: Former Smoker -- 0.50 packs/day for 40 years    Types: Cigarettes  Quit date: 04/25/1993  . Smokeless tobacco: Never Used  . Alcohol Use: No    Current Outpatient Prescriptions  Medication Sig Dispense Refill  . B-D ULTRAFINE III SHORT PEN 31G X 8 MM MISC   3  . carvedilol (COREG) 25 MG tablet Take 1 tablet (25 mg total) by mouth 2 (two) times daily with a meal. 60 tablet 0  . docusate sodium (COLACE) 100 MG capsule Take 1 capsule (100 mg total) by mouth 2 (two) times daily. 60 capsule 0  . furosemide (LASIX) 20 MG  tablet Take 20 mg by mouth daily.    . insulin glargine (LANTUS) 100 UNIT/ML injection Inject 0.08 mLs (8 Units total) into the skin at bedtime. 10 mL 11  . insulin lispro (HUMALOG) 100 UNIT/ML injection Inject 2-6 Units into the skin 3 (three) times daily before meals.     . isosorbide mononitrate (IMDUR) 30 MG 24 hr tablet Take 1 tablet (30 mg total) by mouth daily. 30 tablet 0  . levothyroxine (SYNTHROID, LEVOTHROID) 88 MCG tablet Take 88 mcg by mouth daily before breakfast.    . ONETOUCH VERIO test strip   6  . polyethylene glycol (MIRALAX / GLYCOLAX) packet Take 17 g by mouth daily as needed for mild constipation. 14 each 0  . Vitamin D, Ergocalciferol, (DRISDOL) 50000 UNITS CAPS capsule Take 50,000 Units by mouth once a week.  0   No current facility-administered medications for this visit.    Allergies  Allergen Reactions  . Other Other (See Comments)    All acid containing foods such as citrus, tomatoes "make me break out inside"    Review of Systems  Constitutional: Negative for fever, appetite change and unexpected weight change (not sure).  Respiratory: Positive for cough. Negative for chest tightness, shortness of breath and wheezing.   Cardiovascular: Negative for chest pain.  Gastrointestinal: Positive for blood in stool. Negative for abdominal pain.       Recent hemi-colectomy  Endocrine: Negative.   Musculoskeletal: Positive for gait problem (uses cane).  Neurological: Negative.  Negative for seizures and headaches.  All other systems reviewed and are negative.   BP 177/93 mmHg  Pulse 63  Resp 20  Ht '5\' 5"'$  (1.651 m)  Wt 226 lb (102.513 kg)  BMI 37.61 kg/m2  SpO2 95% Physical Exam  Constitutional: She is oriented to person, place, and time. No distress.  Markedly obese  HENT:  Head: Normocephalic and atraumatic.  Eyes: EOM are normal. Pupils are equal, round, and reactive to light.  Neck: Neck supple.  Cardiovascular: Normal rate, regular rhythm and normal  heart sounds.   No murmur heard. Pulmonary/Chest: Effort normal. She has no wheezes. She has no rales.  Diminished breath sounds both bases  Abdominal: Soft. There is no tenderness.  Midline surgical scar healing well  Musculoskeletal: She exhibits no edema.  Lymphadenopathy:    She has no cervical adenopathy.  Neurological: She is alert and oriented to person, place, and time. No cranial nerve deficit.  Most slowly but no focal neurologic deficit  Skin: Skin is warm and dry.  Vitals reviewed.    Diagnostic Tests: CT CHEST, ABDOMEN, AND PELVIS WITHOUT CONTRAST  TECHNIQUE: Multidetector CT imaging of the chest, abdomen, and pelvis was performed following the standard protocol without IV contrast.  COMPARISON: None.  FINDINGS: Lack of intravenous contrast. Oral contrast is only present in the colon an a few small bowel loops. These factors severely limits this examination. Metastatic disease can be missed by  this study.  Small mediastinal nodes. Three vessel coronary artery calcifications.  No pneumothorax. No pleural effusion.  Sub solid 10 mm opacity at the right apex. Spiculated and cavitary irregular lesion in the superior segment of the right lower lobe measures 1.6 x 2.8 cm on image 23. Vague ground-glass opacity in the right lower lobe on image 27. Left lung is clear.  The unenhanced liver, gallbladder, spleen, pancreas, adrenal glands are grossly within normal limits. Chronic changes of the kidneys which are lobulated. No obvious mass.  There is irregular wall thickening in this cecum. See image 80 of series 2. This may correspond to the patient's known mass in the cecum. The appendix is unremarkable. Terminal ileum is unremarkable. No obvious mesenteric adenopathy. No obvious retroperitoneal adenopathy by measurement criteria.  Calcified uterine fibroids. Bladder and adnexa are within normal limits.  No destructive bone lesion. Left hemi  laminectomy at L5-S1. Spinal stenosis at L4-5. Facet arthropathy throughout the lumbar spine. Severe degenerative disc disease at L5-S1 with vacuum.  IMPRESSION: Limited study as delineated above.  1.6 x 2.8 cm cavitary and spiculated lung mass in the superior segment of the right lower lobe. PET-CT is recommended.  There are ground-glass opacities and sub solid nodules in the right lung as described.  No obvious mediastinal adenopathy.  Wall thickening in the cecum which may correspond to the patient's known lesion.   Electronically Signed  By: Marybelle Killings M.D.  On: 07/08/2014 15:36 NUCLEAR MEDICINE PET SKULL BASE TO THIGH  TECHNIQUE: 11.32 mCi F-18 FDG was injected intravenously. Full-ring PET imaging was performed from the skull base to thigh after the radiotracer. CT data was obtained and used for attenuation correction and anatomic localization.  FASTING BLOOD GLUCOSE: Value: 119 mg/dl  COMPARISON: CT chest abdomen pelvis dated 07/08/2014  FINDINGS: NECK  No hypermetabolic lymph nodes in the neck.  Mild hypermetabolism within the bilateral thyroid gland, nonspecific.  CHEST  2.4 x 2.6 cm subsolid pulmonary nodule in the superior segment right lower lobe (series 6/ image 26), unchanged from prior CT chest, max SUV 2.8. This appearance is worrisome for primary bronchogenic neoplasm, likely adenocarcinoma. Metastatic disease related to cecal adenocarcinoma is considered unlikely given the appearance/configuration.  No evidence of hypermetabolic thoracic lymphadenopathy.  ABDOMEN/PELVIS  No abnormal hypermetabolic activity within the liver, pancreas, adrenal glands, or spleen.  No hypermetabolic lymph nodes in the abdomen or pelvis.  Status post right hemicolectomy with ileocecal anastomosis in the right mid abdomen. Associated hypermetabolism along the surgical anastomosis, max SUV 5.7.  Postsurgical changes along the  anterior abdominal wall with associated mild hypermetabolism, max SUV 5.5.  SKELETON  No focal hypermetabolic activity to suggest skeletal metastasis.  IMPRESSION: Status post right hemicolectomy.  No findings specific for metastatic disease. Associated hypermetabolism along the ileocolic anastomosis and along the anterior abdominal wall, postsurgical.  2.4 x 2.6 cm hypermetabolic subsolid pulmonary nodule in the superior segment right lower lobe, worrisome for primary bronchogenic neoplasm, likely adenocarcinoma. Metastatic disease is considered unlikely given the appearance/configuration. Consider percutaneous biopsy and/or surgical excision.   Electronically Signed  By: Julian Hy M.D.  On: 08/19/2014 12:42  Pulmonary function testing FVC 1.79 (72%) FEV1 1.27 (70%)  Impression: 74 year old woman with multiple medical problems as detailed above. She recently was hospitalized with a gastrointestinal bleed and required a hemicolectomy for colon cancer. During her hospitalization she was found to have a spiculated some solid nodule in the superior segment of her right lower lobe. This was a 2.4 x 2.6 cm  lesion that is hypermetabolic with an SUV max of 2.8.  I personally reviewed the CT and PET images and concur with the findings noted above.  In addition to the dominant nodule there are other smaller groundglass opacities in the right lung including one in the right lower lobe as well as one at the right apex.  Differential diagnosis for this includes infectious, inflammatory, and malignant etiologies. This is highly concerning for a primary bronchogenic carcinoma. It does not appear to be metastatic colon cancer. It has to be considered a primary lung cancer and was secured be proven otherwise.  I discussed on multiple potential options for diagnosis and treatment with Teresa Flowers. We discussed attempted bronchoscopic biopsy, CT-guided biopsy, versus surgical  resection. We discussed possibly treating with radiation therapy versus surgery.  I recommended a right VATS, superior segmentectomy of right lower lobe for definitive diagnosis and treatment of the nodule. I described the general nature of the procedure, including the incisions to be used, the need for general anesthesia, the expected hospital stay, and overall recovery. I reviewed the indications, risks, benefits, and alternatives. She understands the risks include, but are not limited to, death, MI, DVT, PE, bleeding, possible need for transfusion, stroke, prolonged air leak, infection, cardiac arrhythmias, as well as the possibility of unforeseeable complications.  She does state that she would be willing to accept blood transfusions in a life or death situation.  She understands the risks of surgery and wishes to proceed.  Plan: Right VATS, superior segmentectomy of right lower lobe on Monday, 09/08/2014  Melrose Nakayama, MD [facility] [phone]

## 2014-09-08 NOTE — Interval H&P Note (Signed)
History and Physical Interval Note:  09/08/2014 7:19 AM  Teresa Flowers  has presented today for surgery, with the diagnosis of LUNG NODULE SUPERIOR SEGMENT RLL  The various methods of treatment have been discussed with the patient and family. After consideration of risks, benefits and other options for treatment, the patient has consented to  Procedure(s): RIGHT VIDEO ASSISTED THORACOSCOPY (Right) RIGHT LOWER LOBE SUPERIOR SEGMENTECTOMY (Right) as a surgical intervention .  The patient's history has been reviewed, patient examined, no change in status, stable for surgery.  I have reviewed the patient's chart and labs.  Questions were answered to the patient's satisfaction.     Melrose Nakayama

## 2014-09-08 NOTE — Anesthesia Procedure Notes (Signed)
Procedure Name: Intubation Performed by: Mariea Clonts Pre-anesthesia Checklist: Patient identified, Timeout performed, Emergency Drugs available, Suction available and Patient being monitored Patient Re-evaluated:Patient Re-evaluated prior to inductionOxygen Delivery Method: Circle system utilized Preoxygenation: Pre-oxygenation with 100% oxygen Intubation Type: IV induction Ventilation: Mask ventilation without difficulty and Oral airway inserted - appropriate to patient size Laryngoscope Size: Mac and 3 Grade View: Grade I Tube type: Oral Endobronchial tube: Left and 39 Fr Number of attempts: 1 Airway Equipment and Method: Stylet Placement Confirmation: ETT inserted through vocal cords under direct vision,  positive ETCO2,  CO2 detector and breath sounds checked- equal and bilateral Secured at: 31 cm Tube secured with: Tape Dental Injury: Teeth and Oropharynx as per pre-operative assessment

## 2014-09-09 ENCOUNTER — Inpatient Hospital Stay (HOSPITAL_COMMUNITY): Payer: Medicare HMO

## 2014-09-09 LAB — GLUCOSE, CAPILLARY
GLUCOSE-CAPILLARY: 103 mg/dL — AB (ref 65–99)
GLUCOSE-CAPILLARY: 134 mg/dL — AB (ref 65–99)
Glucose-Capillary: 215 mg/dL — ABNORMAL HIGH (ref 65–99)
Glucose-Capillary: 243 mg/dL — ABNORMAL HIGH (ref 65–99)
Glucose-Capillary: 270 mg/dL — ABNORMAL HIGH (ref 65–99)
Glucose-Capillary: 293 mg/dL — ABNORMAL HIGH (ref 65–99)
Glucose-Capillary: 41 mg/dL — CL (ref 65–99)

## 2014-09-09 LAB — CBC
HEMATOCRIT: 32 % — AB (ref 36.0–46.0)
HEMOGLOBIN: 10.2 g/dL — AB (ref 12.0–15.0)
MCH: 28.3 pg (ref 26.0–34.0)
MCHC: 31.9 g/dL (ref 30.0–36.0)
MCV: 88.9 fL (ref 78.0–100.0)
Platelets: 249 10*3/uL (ref 150–400)
RBC: 3.6 MIL/uL — ABNORMAL LOW (ref 3.87–5.11)
RDW: 13.3 % (ref 11.5–15.5)
WBC: 10.4 10*3/uL (ref 4.0–10.5)

## 2014-09-09 LAB — POCT I-STAT 3, ART BLOOD GAS (G3+)
Acid-base deficit: 3 mmol/L — ABNORMAL HIGH (ref 0.0–2.0)
BICARBONATE: 21.9 meq/L (ref 20.0–24.0)
O2 Saturation: 97 %
PCO2 ART: 36.7 mmHg (ref 35.0–45.0)
PO2 ART: 89 mmHg (ref 80.0–100.0)
Patient temperature: 98.8
TCO2: 23 mmol/L (ref 0–100)
pH, Arterial: 7.385 (ref 7.350–7.450)

## 2014-09-09 LAB — BASIC METABOLIC PANEL
Anion gap: 8 (ref 5–15)
BUN: 29 mg/dL — ABNORMAL HIGH (ref 6–20)
CO2: 22 mmol/L (ref 22–32)
Calcium: 8.6 mg/dL — ABNORMAL LOW (ref 8.9–10.3)
Chloride: 110 mmol/L (ref 101–111)
Creatinine, Ser: 2.08 mg/dL — ABNORMAL HIGH (ref 0.44–1.00)
GFR calc Af Amer: 26 mL/min — ABNORMAL LOW (ref >60)
GFR, EST NON AFRICAN AMERICAN: 22 mL/min — AB (ref >60)
GLUCOSE: 156 mg/dL — AB (ref 65–99)
POTASSIUM: 4.1 mmol/L (ref 3.5–5.1)
Sodium: 140 mmol/L (ref 135–145)

## 2014-09-09 MED ORDER — INSULIN GLARGINE 100 UNIT/ML ~~LOC~~ SOLN
8.0000 [IU] | Freq: Every day | SUBCUTANEOUS | Status: DC
  Administered 2014-09-09 – 2014-09-12 (×4): 8 [IU] via SUBCUTANEOUS
  Filled 2014-09-09 (×5): qty 0.08

## 2014-09-09 MED ORDER — FUROSEMIDE 20 MG PO TABS
20.0000 mg | ORAL_TABLET | Freq: Every day | ORAL | Status: DC | PRN
  Administered 2014-09-09: 20 mg via ORAL
  Filled 2014-09-09 (×2): qty 1

## 2014-09-09 MED ORDER — POLYETHYLENE GLYCOL 3350 17 G PO PACK
17.0000 g | PACK | Freq: Every day | ORAL | Status: DC | PRN
  Filled 2014-09-09: qty 1

## 2014-09-09 MED ORDER — ENOXAPARIN SODIUM 30 MG/0.3ML ~~LOC~~ SOLN
30.0000 mg | Freq: Every day | SUBCUTANEOUS | Status: DC
  Administered 2014-09-09 – 2014-09-13 (×5): 30 mg via SUBCUTANEOUS
  Filled 2014-09-09 (×5): qty 0.3

## 2014-09-09 MED ORDER — INSULIN ASPART 100 UNIT/ML ~~LOC~~ SOLN
0.0000 [IU] | Freq: Three times a day (TID) | SUBCUTANEOUS | Status: DC
  Administered 2014-09-09: 3 [IU] via SUBCUTANEOUS
  Administered 2014-09-09: 5 [IU] via SUBCUTANEOUS
  Administered 2014-09-09 – 2014-09-10 (×4): 3 [IU] via SUBCUTANEOUS
  Administered 2014-09-11 (×2): 1 [IU] via SUBCUTANEOUS
  Administered 2014-09-11 – 2014-09-12 (×4): 2 [IU] via SUBCUTANEOUS
  Administered 2014-09-13: 1 [IU] via SUBCUTANEOUS
  Administered 2014-09-13: 2 [IU] via SUBCUTANEOUS

## 2014-09-09 NOTE — Progress Notes (Signed)
1 Day Post-Op Procedure(s) (LRB): RIGHT VIDEO ASSISTED THORACOSCOPY (Right) RIGHT LOWER LOBE SUPERIOR SEGMENTECTOMY (Right) Subjective: Some incisional pain Nausea last night, better this AM  Objective: Vital signs in last 24 hours: Temp:  [96.5 F (35.8 C)-98.8 F (37.1 C)] 98.8 F (37.1 C) (05/17 0400) Pulse Rate:  [64-86] 80 (05/17 0700) Cardiac Rhythm:  [-] Normal sinus rhythm (05/16 2000) Resp:  [11-20] 20 (05/17 0746) BP: (110-177)/(44-109) 150/54 mmHg (05/17 0700) SpO2:  [97 %-100 %] 98 % (05/17 0746) Arterial Line BP: (180-221)/(63-145) 189/63 mmHg (05/16 1400) Weight:  [230 lb 9.6 oz (104.6 kg)] 230 lb 9.6 oz (104.6 kg) (05/17 0600)  Hemodynamic parameters for last 24 hours:    Intake/Output from previous day: 05/16 0701 - 05/17 0700 In: 2270 [P.O.:120; I.V.:2050; IV Piggyback:100] Out: 2141 [Urine:1385; Blood:150; Chest Tube:606] Intake/Output this shift:    General appearance: alert and no distress Neurologic: intact Heart: regular rate and rhythm Lungs: diminished breath sounds bibasilar Abdomen: normal findings: soft, non-tender no air leak  Lab Results:  Recent Labs  09/08/14 0611 09/09/14 0456  WBC  --  10.4  HGB 11.2* 10.2*  HCT 33.0* 32.0*  PLT  --  249   BMET:  Recent Labs  09/08/14 0611 09/09/14 0456  NA 141 140  K 4.2 4.1  CL 109 110  CO2  --  22  GLUCOSE 205* 156*  BUN 31* 29*  CREATININE 1.90* 2.08*  CALCIUM  --  8.6*    PT/INR: No results for input(s): LABPROT, INR in the last 72 hours. ABG    Component Value Date/Time   PHART 7.385 09/09/2014 0435   HCO3 21.9 09/09/2014 0435   TCO2 23 09/09/2014 0435   ACIDBASEDEF 3.0* 09/09/2014 0435   O2SAT 97.0 09/09/2014 0435   CBG (last 3)   Recent Labs  09/08/14 2338 09/09/14 0001 09/09/14 0358  GLUCAP 41* 103* 134*    Assessment/Plan: S/P Procedure(s) (LRB): RIGHT VIDEO ASSISTED THORACOSCOPY (Right) RIGHT LOWER LOBE SUPERIOR SEGMENTECTOMY (Right) Plan for transfer to  step-down: see transfer orders  POD # 1 CV- hypertension has been problematic- continue home meds + PRN  RESP_ s/p segementectomy  No air leak- CT to water seal  IS, pulmonary hygiene  RENAL- creatinine up slightly  Lytes OK  ENDO- hypoglycemic this morning- change to AC/HS  Anemia- acute secondary to ABL on chronic- follow  DVT prophylaxis- SCD + enoxaparin  OOB to chair and ambulate   LOS: 1 day    Melrose Nakayama 09/09/2014

## 2014-09-09 NOTE — Progress Notes (Signed)
Trabsferred to 3S12 via WC by AD Gerald Stabs.

## 2014-09-09 NOTE — Progress Notes (Signed)
Report called to 3S RN 

## 2014-09-09 NOTE — Progress Notes (Signed)
Attempted to call report to 3S 

## 2014-09-09 NOTE — Op Note (Signed)
NAMESHERIA, ROSELLO              ACCOUNT NO.:  [id number]  MEDICAL RECORD NO.:  [id number]  LOCATION:  2S09C                        FACILITY:  [facility]  PHYSICIAN:  Revonda Standard. Roxan Hockey, M.D.DATE OF BIRTH:  19-Feb-1941  DATE OF PROCEDURE:  09/08/2014 DATE OF DISCHARGE:                              OPERATIVE REPORT   PREOPERATIVE DIAGNOSIS:  Right lower lobe nodule.  POSTOPERATIVE DIAGNOSIS:  Non-small cell carcinoma, right lower lobe, clinical stage IA.  PROCEDURES:   Right video-assisted thoracoscopy, Thoracoscopic right lower lobe superior segmentectomy, Mediastinal lymph node dissection,  On-Q local anesthetic catheter placement.  SURGEON:  Revonda Standard. Roxan Hockey, M.D.  ASSISTANT:  John Giovanni, P.A.-C.  ANESTHESIA:  General.  FINDINGS:  Approximately 2 cm mass in the superior segment of the right lower lobe.  Frozen section revealed non-small cell carcinoma, clear margins.  CLINICAL NOTE:  Teresa Flowers is a 74 year old woman, who is a [employer]'s [employer]. She was admitted in March with an acute GI bleed.  She was diagnosed with colon cancer and underwent a right hemicolectomy.  During her hospitalization, she was found to have a nodule in the superior segment of right lower lobe.  A PET-CT showed the lesion to be mildly hypermetabolic.  She was advised to undergo surgical resection for definitive diagnosis as well as treatment.  This lesion was amenable to segmentectomy.  The indications, risks, benefits, and alternatives were discussed in detail with the patient.  She understood and accepted the risks and agreed to proceed.  She did state clearly that she would accept blood transfusions if needed, but asked that it only be given if absolutely necessary.  OPERATIVE NOTE:  [name] was brought to the preoperative holding area on Sep 08, 2014. Anesthesia placed a central line and an arterial blood pressure monitoring line.  She was taken to the operating room,  anesthetized, and intubated with a double-lumen endotracheal tube. Intravenous antibiotics were administered.  A Foley catheter was placed. Sequential compressive devices were placed on the calves for DVT prophylaxis.  She was placed in the left lateral decubitus position, and the right chest was prepped and draped in usual sterile fashion.  Single lung ventilation of the left lung was initiated and was tolerated well throughout the procedure.  An incision made in the seventh intercostal space in the midaxillary Line. It was carried through the skin and subcutaneous tissue.  The chest was entered bluntly using a 5-mm port.  On the first placement of the port, it was intraabdominal.  The port was removed and inserted 1 interspace higher and was intrathoracic.  A second port incision was made just anterior to this for instrumentation.  There was no significant pleural effusion.  There were adhesions particularly between the middle lobe and upper lobe and the chest wall.  A small working Incision, 5 cm in length, was made.  No rib spreading was performed during the procedure.  The adhesions were taken down using electrocautery.  The fissure was complete at the confluence of the major and minor fissure.  More laterally, the fissure was incomplete.  The lung was grasped and the superior segment was palpated and the nodule was clearly palpable within the superior segment.  There were some adhesions between the upper lobe and superior segment, which were taken down.  The pleura overlying the pulmonary artery was incised and the pulmonary arterial anatomy was identified.  The basilar segmental branches were identified and then 2 relatively smaller branches supplying the superior segment were seen. All lymph nodes that were encountered during the dissection were sent for permanent pathology.  As noted, the fissure was incomplete laterally.  This was completed with sequential firings of an  endoscopic stapler.  After enough of the fissure had been divided to allow the superior segmental pulmonary arterial branches to be safely stapled, they were encircled and divided with endoscopic vascular stapler.  The remainder of the major fissure then was completed.  Next, the inferior pulmonary ligament was divided.  The pleura was divided at the hilum posteriorly.  The superior segmental bronchus was dissected out and encircled.  The stapler was placed across the superior segmental bronchus and closed.  A test inflation showed good aeration of the upper and middle lobes as well as the basilar segments of the lower lobe.  The stapler then was fired transecting the superior segmental bronchus.  The demarcation of the superior segment was clearly visible.  The segmentectomy was completed with parenchymal division taking some additional margin from the basilar segments to ensure complete segmentectomy.  This was done using both tan and green cartridges as needed due to tissue thickness.  The specimen was placed into an endoscopic retrieval bag and removed through the incision. It was sent for frozen section of the mass and the margins.  While awaiting that result, the pleura overlying the subcarinal space was incised and an enlarged, but otherwise benign-appearing node was identified.  It was removed.  Next, the dissection was carried anteriorly and the 4R right paratracheal nodes were identified.  Two large nodes were removed and sent for permanent pathology.  The chest was copiously irrigated with warm saline.  A test inflation revealed no significant air leak.  The fluid was evacuated.  An On-Q local anesthetic catheter was placed through a separate stab incision posteriorly and tunneled into a subpleural location.  It was primed with 5 mL of 0.5% Marcaine.  By this point, the frozen section returned showing non-small cell carcinoma.  There was a clear bronchial margin.  The lateral  aspect of the right upper lobe was palpated.  There was a small ground-glass nodule apparent on the CT. This could not be palpated and so no resection was performed in that area.  A 28-French Blake drain was placed through the original port incision and directed posteriorly, the 28-French chest tube was placed through the second more anterior port incision and directed anteriorly. These were secured to the skin with #1 silk sutures.  The right lung was reinflated and there was good reinflation of the entire lung.  The utility incision was closed with a #1 Vicryl fascial suture followed by a 2-0 Vicryl subcutaneous suture and a 3-0 Vicryl subcuticular suture. All sponge, needle, and instrument counts were correct at the end of the procedure.  The patient was extubated in the operating room, taken to the postanesthetic care unit in good condition.     Revonda Standard Roxan Hockey, M.D.     SCH/MEDQ  D:  09/08/2014  T:  09/09/2014  Job:  [id number]

## 2014-09-09 NOTE — Progress Notes (Signed)
Hypoglycemic Event  CBG: 41  Treatment: D50 IV 25 mL  Symptoms: Hungry  Follow-up CBG: Time:0001 CBG Result:103  Possible Reasons for Event: Inadequate meal intake  Comments/MD notified:no    Teresa Flowers, Armando Reichert  Remember to initiate Hypoglycemia Order Set & complete

## 2014-09-10 ENCOUNTER — Inpatient Hospital Stay (HOSPITAL_COMMUNITY): Payer: Medicare HMO

## 2014-09-10 ENCOUNTER — Encounter (HOSPITAL_COMMUNITY): Payer: Self-pay | Admitting: Thoracic Surgery (Cardiothoracic Vascular Surgery)

## 2014-09-10 LAB — COMPREHENSIVE METABOLIC PANEL
ALT: 24 U/L (ref 14–54)
AST: 24 U/L (ref 15–41)
Albumin: 2.5 g/dL — ABNORMAL LOW (ref 3.5–5.0)
Alkaline Phosphatase: 62 U/L (ref 38–126)
Anion gap: 6 (ref 5–15)
BILIRUBIN TOTAL: 0.3 mg/dL (ref 0.3–1.2)
BUN: 33 mg/dL — AB (ref 6–20)
CALCIUM: 8.5 mg/dL — AB (ref 8.9–10.3)
CHLORIDE: 109 mmol/L (ref 101–111)
CO2: 22 mmol/L (ref 22–32)
CREATININE: 2.53 mg/dL — AB (ref 0.44–1.00)
GFR calc Af Amer: 20 mL/min — ABNORMAL LOW (ref >60)
GFR calc non Af Amer: 18 mL/min — ABNORMAL LOW (ref >60)
Glucose, Bld: 235 mg/dL — ABNORMAL HIGH (ref 65–99)
Potassium: 4.4 mmol/L (ref 3.5–5.1)
Sodium: 137 mmol/L (ref 135–145)
TOTAL PROTEIN: 6.2 g/dL — AB (ref 6.5–8.1)

## 2014-09-10 LAB — CBC
HCT: 29.9 % — ABNORMAL LOW (ref 36.0–46.0)
HEMOGLOBIN: 9.4 g/dL — AB (ref 12.0–15.0)
MCH: 28.1 pg (ref 26.0–34.0)
MCHC: 31.4 g/dL (ref 30.0–36.0)
MCV: 89.3 fL (ref 78.0–100.0)
PLATELETS: 228 10*3/uL (ref 150–400)
RBC: 3.35 MIL/uL — ABNORMAL LOW (ref 3.87–5.11)
RDW: 13.6 % (ref 11.5–15.5)
WBC: 9.2 10*3/uL (ref 4.0–10.5)

## 2014-09-10 LAB — GLUCOSE, CAPILLARY
GLUCOSE-CAPILLARY: 209 mg/dL — AB (ref 65–99)
GLUCOSE-CAPILLARY: 207 mg/dL — AB (ref 65–99)
GLUCOSE-CAPILLARY: 106 mg/dL — AB (ref 65–99)
Glucose-Capillary: 212 mg/dL — ABNORMAL HIGH (ref 65–99)

## 2014-09-10 MED ORDER — GUAIFENESIN ER 600 MG PO TB12
1200.0000 mg | ORAL_TABLET | Freq: Two times a day (BID) | ORAL | Status: DC
  Administered 2014-09-10 – 2014-09-13 (×7): 1200 mg via ORAL
  Filled 2014-09-10 (×8): qty 2

## 2014-09-10 MED ORDER — INSULIN ASPART 100 UNIT/ML ~~LOC~~ SOLN
3.0000 [IU] | Freq: Three times a day (TID) | SUBCUTANEOUS | Status: DC
  Administered 2014-09-10 – 2014-09-13 (×9): 3 [IU] via SUBCUTANEOUS

## 2014-09-10 NOTE — Progress Notes (Signed)
2 Days Post-Op Procedure(s) (LRB): RIGHT VIDEO ASSISTED THORACOSCOPY (Right) RIGHT LOWER LOBE SUPERIOR SEGMENTECTOMY (Right) Subjective: Some incisional pain Appetite good   Objective: Vital signs in last 24 hours: Temp:  [98.2 F (36.8 C)-99 F (37.2 C)] 98.7 F (37.1 C) (05/18 0320) Pulse Rate:  [77-82] 82 (05/17 1100) Cardiac Rhythm:  [-] Normal sinus rhythm (05/18 0320) Resp:  [15-22] 22 (05/18 0821) BP: (109-190)/(36-94) 190/65 mmHg (05/18 0800) SpO2:  [95 %-99 %] 97 % (05/18 0821)  Hemodynamic parameters for last 24 hours:    Intake/Output from previous day: 05/17 0701 - 05/18 0700 In: 1799.2 [P.O.:600; I.V.:1199.2] Out: 800 [Urine:550; Chest Tube:250] Intake/Output this shift:    General appearance: alert, cooperative and no distress Neurologic: intact Heart: regular rate and rhythm Lungs: diminished breath sounds bibasilar and friction rub on right Abdomen: normal findings: soft, non-tender no air leak, serosanguinous drainage  Lab Results:  Recent Labs  09/09/14 0456 09/10/14 0339  WBC 10.4 9.2  HGB 10.2* 9.4*  HCT 32.0* 29.9*  PLT 249 228   BMET:  Recent Labs  09/09/14 0456 09/10/14 0339  NA 140 137  K 4.1 4.4  CL 110 109  CO2 22 22  GLUCOSE 156* 235*  BUN 29* 33*  CREATININE 2.08* 2.53*  CALCIUM 8.6* 8.5*    PT/INR: No results for input(s): LABPROT, INR in the last 72 hours. ABG    Component Value Date/Time   PHART 7.385 09/09/2014 0435   HCO3 21.9 09/09/2014 0435   TCO2 23 09/09/2014 0435   ACIDBASEDEF 3.0* 09/09/2014 0435   O2SAT 97.0 09/09/2014 0435   CBG (last 3)   Recent Labs  09/09/14 1145 09/09/14 1704 09/09/14 2113  GLUCAP 243* 293* 270*    Assessment/Plan: S/P Procedure(s) (LRB): RIGHT VIDEO ASSISTED THORACOSCOPY (Right) RIGHT LOWER LOBE SUPERIOR SEGMENTECTOMY (Right) -  CV- hypertension persists- continue current medications  RESP- s/p segmentectomy, no air leak- dc anterior CT  IS, add mucinex to help  clear secretions  RENAL- creatinine up to 2.5, was 2.4 on 5/12, down to 1.9 immediately preop  Follow  ENDO- CBG poorly controlled- will add meal coverage in addition to SSI  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

## 2014-09-10 NOTE — Progress Notes (Addendum)
Inpatient Diabetes Program Recommendations  AACE/ADA: New Consensus Statement on Inpatient Glycemic Control (2013)  Target Ranges:  Prepandial:   less than 140 mg/dL      Peak postprandial:   less than 180 mg/dL (1-2 hours)      Critically ill patients:  140 - 180 mg/dL   Results for Teresa Flowers, Teresa Flowers (MRN [id number]) as of 09/10/2014 10:24  Ref. Range 09/09/2014 07:56 09/09/2014 11:45 09/09/2014 17:04 09/09/2014 21:13 09/10/2014 07:47  Glucose-Capillary Latest Ref Range: 65-99 mg/dL 215 (H) 243 (H) 293 (H) 270 (H) 212 (H)   Reason for Visit: RLL superior segmentectomy  Diabetes history: DM 2 Outpatient Diabetes medications: Lantus 8 units QHS, Humalog 2-6 units TID Current orders for Inpatient glycemic control: Lantus 8 units QHS, Novolog 0-9 units TID, Novolog 3 units meal coverage   Inpatient Diabetes Program Recommendations Insulin - Basal: Patient's glucose is in the 200's. Please consider increasing basal insulin two more units. Lantus 10 units QHS. Insulin - Meal Coverage: Noted Novolog 3 units meal coverage added this am. Patient recieved 5 units of Novolog at dinner for 293 glucose and only came down to 270 mg/dl. If patient's glucose is still elevated at lunch please consider increasing meal coverage to 5 units Novolog TID. Insulin - Correction: Please consider adding Novolog 0-5 units QHS for bedtime coverage.  Thanks,  Tama Headings RN, MSN, [facility] Inpatient Diabetes Coordinator Team Pager [phone]

## 2014-09-11 ENCOUNTER — Other Ambulatory Visit: Payer: Self-pay | Admitting: *Deleted

## 2014-09-11 ENCOUNTER — Telehealth: Payer: Self-pay | Admitting: Internal Medicine

## 2014-09-11 ENCOUNTER — Inpatient Hospital Stay (HOSPITAL_COMMUNITY): Payer: Medicare HMO

## 2014-09-11 DIAGNOSIS — R911 Solitary pulmonary nodule: Secondary | ICD-10-CM

## 2014-09-11 LAB — URINALYSIS, ROUTINE W REFLEX MICROSCOPIC
Bilirubin Urine: NEGATIVE
GLUCOSE, UA: NEGATIVE mg/dL
Ketones, ur: NEGATIVE mg/dL
NITRITE: NEGATIVE
PH: 5 (ref 5.0–8.0)
Protein, ur: 100 mg/dL — AB
Specific Gravity, Urine: 1.011 (ref 1.005–1.030)
Urobilinogen, UA: 0.2 mg/dL (ref 0.0–1.0)

## 2014-09-11 LAB — GLUCOSE, CAPILLARY
Glucose-Capillary: 124 mg/dL — ABNORMAL HIGH (ref 65–99)
Glucose-Capillary: 170 mg/dL — ABNORMAL HIGH (ref 65–99)
Glucose-Capillary: 131 mg/dL — ABNORMAL HIGH (ref 65–99)
Glucose-Capillary: 200 mg/dL — ABNORMAL HIGH (ref 65–99)

## 2014-09-11 LAB — BASIC METABOLIC PANEL
Anion gap: 7 (ref 5–15)
BUN: 29 mg/dL — ABNORMAL HIGH (ref 6–20)
CHLORIDE: 109 mmol/L (ref 101–111)
CO2: 22 mmol/L (ref 22–32)
Calcium: 8.4 mg/dL — ABNORMAL LOW (ref 8.9–10.3)
Creatinine, Ser: 2.15 mg/dL — ABNORMAL HIGH (ref 0.44–1.00)
GFR calc Af Amer: 25 mL/min — ABNORMAL LOW (ref >60)
GFR calc non Af Amer: 21 mL/min — ABNORMAL LOW (ref >60)
GLUCOSE: 103 mg/dL — AB (ref 65–99)
Potassium: 4.4 mmol/L (ref 3.5–5.1)
Sodium: 138 mmol/L (ref 135–145)

## 2014-09-11 LAB — URINE MICROSCOPIC-ADD ON

## 2014-09-11 MED ORDER — LEVOFLOXACIN 250 MG PO TABS
250.0000 mg | ORAL_TABLET | Freq: Every day | ORAL | Status: DC
Start: 2014-09-11 — End: 2014-09-13
  Administered 2014-09-11 – 2014-09-13 (×3): 250 mg via ORAL
  Filled 2014-09-11 (×3): qty 1

## 2014-09-11 NOTE — Progress Notes (Signed)
       [address]       [place] [address]             [phone]          3 Days Post-Op Procedure(s) (LRB): RIGHT VIDEO ASSISTED THORACOSCOPY (Right) RIGHT LOWER LOBE SUPERIOR SEGMENTECTOMY (Right)  Subjective: OOB in chair.  C/o dysuria. Breathing stable, very little cough with clear sputum. Appetite good.  Walking some in room.   Objective: Vital signs in last 24 hours: Patient Vitals for the past 24 hrs:  BP Temp Temp src Resp SpO2  09/11/14 0736 (!) 152/72 mmHg - - (!) 21 -  09/11/14 0315 (!) 162/68 mmHg 99.2 F (37.3 C) Oral 16 97 %  09/10/14 2340 130/61 mmHg - - 18 -  09/10/14 2335 - 98.7 F (37.1 C) Oral 17 98 %  09/10/14 1954 (!) 159/60 mmHg - - 17 100 %  09/10/14 1941 - 98.8 F (37.1 C) Oral 16 100 %  09/10/14 1521 (!) 143/46 mmHg - - 16 -  09/10/14 1339 - - - 18 95 %  09/10/14 1123 (!) 157/58 mmHg 98.9 F (37.2 C) Oral - -   Current Weight  09/09/14 230 lb 9.6 oz (104.6 kg)     Intake/Output from previous day: 05/18 0701 - 05/19 0700 In: 1440 [P.O.:240; I.V.:1200] Out: 940 [Urine:510; Chest Tube:430]  CBGs 106-103-124  PHYSICAL EXAM:  Heart: RRR Lungs: Diminished BS R base Wound: Clean and dry Chest tube: No air leak    Lab Results: CBC: Recent Labs  09/09/14 0456 09/10/14 0339  WBC 10.4 9.2  HGB 10.2* 9.4*  HCT 32.0* 29.9*  PLT 249 228   BMET:  Recent Labs  09/10/14 0339 09/11/14 0330  NA 137 138  K 4.4 4.4  CL 109 109  CO2 22 22  GLUCOSE 235* 103*  BUN 33* 29*  CREATININE 2.53* 2.15*  CALCIUM 8.5* 8.4*    PT/INR: No results for input(s): LABPROT, INR in the last 72 hours.  CXR: FINDINGS: Stable right chest tube. No developing effusion or significant pneumothorax. Stable right IJ central line tip at the mid SVC level.  Mild cardiomegaly with vascular congestion. Degenerative changes of the spine. Minor residual right midlung atelectasis. No significant interval change.  IMPRESSION: Stable  postoperative appearance of the right hemi thorax.  Mild cardiomegaly with vascular congestion   Assessment/Plan: S/P Procedure(s) (LRB): RIGHT VIDEO ASSISTED THORACOSCOPY (Right) RIGHT LOWER LOBE SUPERIOR SEGMENTECTOMY (Right)  CT with no air leak but output remains high, around 200 ml per shift yesterday. Will leave CT to water seal for now.  GU- c/o dysuria. U/a with few bacteria, trace leukocytes. Will send urine for cx and start empiric abx.  WBC stable, no fever.  DM-  CBGs improved. Continue home Lantus dose, meal coverage, SSI.  D/c On-Q, IVF to [facility], continue to mobilize as tolerated.  Pulm toilet/IS.  CKD- Cr stable around 2.   LOS: 3 days    COLLINS,GINA H 09/11/2014

## 2014-09-11 NOTE — Telephone Encounter (Signed)
new patient referral-s/w Teresa Flowers and gave appt for 06/07 @ 1:45 w/Dr. Julien Nordmann. They are aware appt will print outon discharge summary

## 2014-09-12 ENCOUNTER — Inpatient Hospital Stay (HOSPITAL_COMMUNITY): Payer: Medicare HMO

## 2014-09-12 LAB — CBC
HCT: 27.5 % — ABNORMAL LOW (ref 36.0–46.0)
HEMOGLOBIN: 8.9 g/dL — AB (ref 12.0–15.0)
MCH: 28.5 pg (ref 26.0–34.0)
MCHC: 32.4 g/dL (ref 30.0–36.0)
MCV: 88.1 fL (ref 78.0–100.0)
Platelets: 249 10*3/uL (ref 150–400)
RBC: 3.12 MIL/uL — AB (ref 3.87–5.11)
RDW: 13.4 % (ref 11.5–15.5)
WBC: 6.4 10*3/uL (ref 4.0–10.5)

## 2014-09-12 LAB — GLUCOSE, CAPILLARY
GLUCOSE-CAPILLARY: 142 mg/dL — AB (ref 65–99)
Glucose-Capillary: 157 mg/dL — ABNORMAL HIGH (ref 65–99)
Glucose-Capillary: 186 mg/dL — ABNORMAL HIGH (ref 65–99)
Glucose-Capillary: 168 mg/dL — ABNORMAL HIGH (ref 65–99)

## 2014-09-12 LAB — BASIC METABOLIC PANEL
ANION GAP: 5 (ref 5–15)
BUN: 25 mg/dL — ABNORMAL HIGH (ref 6–20)
CALCIUM: 8.8 mg/dL — AB (ref 8.9–10.3)
CO2: 24 mmol/L (ref 22–32)
Chloride: 108 mmol/L (ref 101–111)
Creatinine, Ser: 1.97 mg/dL — ABNORMAL HIGH (ref 0.44–1.00)
GFR calc Af Amer: 28 mL/min — ABNORMAL LOW (ref >60)
GFR, EST NON AFRICAN AMERICAN: 24 mL/min — AB (ref >60)
GLUCOSE: 143 mg/dL — AB (ref 65–99)
POTASSIUM: 4.5 mmol/L (ref 3.5–5.1)
Sodium: 137 mmol/L (ref 135–145)

## 2014-09-12 LAB — TYPE AND SCREEN
ABO/RH(D): O POS
ANTIBODY SCREEN: NEGATIVE
UNIT DIVISION: 0
UNIT DIVISION: 0
Unit division: 0
Unit division: 0

## 2014-09-12 MED ORDER — PANTOPRAZOLE SODIUM 40 MG PO TBEC
40.0000 mg | DELAYED_RELEASE_TABLET | Freq: Every day | ORAL | Status: DC
  Administered 2014-09-12 – 2014-09-13 (×2): 40 mg via ORAL
  Filled 2014-09-12 (×2): qty 1

## 2014-09-12 NOTE — Discharge Summary (Signed)
Physician Discharge Summary       [address]       [place] [address]             [phone]    Patient ID: Teresa Flowers MRN: [id number] DOB/AGE: 1941/02/21 74 y.o.  Admit date: 09/08/2014 Discharge date: 09/12/2014  Admission Diagnoses: 1. Right lower lobe lung nodule 2. History of tobacco abuse 3. History of essential hypertension 4. History of high cholesterol 5. History of CKD (stage III) 6. History of obestty 7. History of hypothyroidism 8. History of TIA/CVA 9. History of GERD 10. History of DM 11. History of vertigo  Discharge Diagnoses:  1. Adenocarcinoma of the RLL- stage IA 2. History of tobacco abuse 3. History of essential hypertension 4. History of high cholesterol 5. History of CKD (stage III) 6. History of obestty 7. History of hypothyroidism 8. History of TIA/CVA 9. History of GERD 10. History of DM 11. History of vertigo  Procedure (s):  Right video-assisted thoracoscopy, thoracoscopic right lower lobe superior segmentectomy, mediastinal lymph node dissection, On-Q local anesthetic catheter placement by Dr. Roxan Hockey on 09/08/2014.  Pathology: 1. Lung, resection (segmental or lobe), right lower lobe superior segment - INVASIVE ADENOCARCINOMA, MODERATELY DIFFERENTIATED, SPANNING 2.6 CM. - THE SURGICAL RESECTION MARGINS ARE NEGATIVE FOR ADENOCARCINOMA. - SEE ONCOLOGY TABLE BELOW. 2. Lymph node, biopsy, 12R - THERE IS NO EVIDENCE OF CARCINOMA IN 1 OF 1 LYMPH NODE (0/1). 3. Lymph node, biopsy, 11R - THERE IS NO EVIDENCE OF CARCINOMA IN 1 OF 1 LYMPH NODE (0/1). 4. Lymph node, biopsy, 11R #2 - THERE IS NO EVIDENCE OF CARCINOMA IN 1 OF 1 LYMPH NODE (0/1). 5. Lymph node, biopsy, 7 - THERE IS NO EVIDENCE OF CARCINOMA IN 1 OF 1 LYMPH NODE (0/1). 6. Lymph node, biopsy, 11R #3 - THERE IS NO EVIDENCE OF CARCINOMA IN 1 OF 1 LYMPH NODE (0/1). 7. Lymph node, biopsy, 11R #4 - THERE IS NO EVIDENCE OF CARCINOMA IN 1 OF 1 LYMPH  NODE (0/1). 8. Lymph node, biopsy, 4R - THERE IS NO EVIDENCE OF CARCINOMA IN 1 OF 1 LYMPH NODE (0/1). 9. Lymph node, biopsy, 4R #2 - THERE IS NO EVIDENCE OF CARCINOMA IN 1 OF 1 LYMPH NODE (0/1).  TNM Code:pT1b, pN0  History of Presenting Illness: Teresa Flowers is a 74 year old [place] with a past medical history significant for hypertension, hypothyroidism, hyperlipidemia, complicated type 2 diabetes, stage III chronic kidney disease, prior stroke, cerebrovascular disease, obesity, reflux, and colon cancer.   She was admitted in March with acute GI bleeding. She was diagnosed with colon cancer. She underwent a right hemicolectomy on 07/09/2014. She was discharged from the hospital on 07/17/2014. During her hospitalization she was found to have a nodule in the superior segment of her right lower lobe. This was confirmed with a CT scan which also showed other groundglass opacities, but the dominant lesion was in the right lower lobe. She recently saw Dr. Melvyn Novas. A PET CT showed the lesion to be mildly hypermetabolic with an SUV of 2.8. There is no evidence of regional or distant metastases.  She says that she is recovered well from her surgery. Her appetite is good. She's not sure she may have lost a few pounds, but denies significant weight loss. She's not having any chest pain, pressure, or tightness. She denies shortness of breath or wheezing. She has no occasional cough and sneezing due to allergies. She denies hemoptysis. Says she has not had any further GI  bleeding since her surgery.  Of note she did consent to and have blood transfusions during her last admission.   Brief Hospital Course:  She remained afebrile and hemodynamically stable. A line and foley were removed early in her post operative course. Post op nausea did resolve. Chest tube output gradually declined. Chest tubes did not have an air leak. Daily chest x rays were obtained and remained stable. Anterior chest tube was  removed 05/18. Remaining chest tube was removed on 05/20.  She is on Levaquin for a presumed UTI. She did have ABL anemia. Last H and H was 8.9 and 27.5. She is ambulating on room air. She is tolerating a diet. She has had a bowel movement. Her right chest wound is continuing to heal and there are no signs of infection. She is felt surgically stable for discharge today.   Latest Vital Signs: Blood pressure 174/74, pulse 70, temperature 98.2 F (36.8 C), temperature source Oral, resp. rate 16, height '5\' 5"'$  (1.651 m), weight 230 lb 9.6 oz (104.6 kg), SpO2 96 %.  Physical Exam: Cardiovascular: RRR. Pulmonary: Diminished at bases; no rales, wheezes, or rhonchi. Abdomen: Soft,obese, mostly non tender, bowel sounds present. Extremities: Mild bilateral lower extremity edema. Wounds: Clean and dry. No erythema or signs of infection.  Discharge Condition:Stable and discharged to home  Recent laboratory studies:  Lab Results  Component Value Date   WBC 6.4 09/12/2014   HGB 8.9* 09/12/2014   HCT 27.5* 09/12/2014   MCV 88.1 09/12/2014   PLT 249 09/12/2014   Lab Results  Component Value Date   NA 137 09/12/2014   K 4.5 09/12/2014   CL 108 09/12/2014   CO2 24 09/12/2014   CREATININE 1.97* 09/12/2014   GLUCOSE 143* 09/12/2014      Diagnostic Studies:  Nm Pet Image Initial (pi) Skull Base To Thigh  08/19/2014   CLINICAL DATA:  Initial treatment strategy for solitary pulmonary nodule. Cecal mass with stage II colon cancer status post resection.  EXAM: NUCLEAR MEDICINE PET SKULL BASE TO THIGH  TECHNIQUE: 11.32 mCi F-18 FDG was injected intravenously. Full-ring PET imaging was performed from the skull base to thigh after the radiotracer. CT data was obtained and used for attenuation correction and anatomic localization.  FASTING BLOOD GLUCOSE:  Value: 119 mg/dl  COMPARISON:  CT chest abdomen pelvis dated 07/08/2014  FINDINGS: NECK  No hypermetabolic lymph nodes in the neck.  Mild hypermetabolism  within the bilateral thyroid gland, nonspecific.  CHEST  2.4 x 2.6 cm subsolid pulmonary nodule in the superior segment right lower lobe (series 6/ image 26), unchanged from prior CT chest, max SUV 2.8. This appearance is worrisome for primary bronchogenic neoplasm, likely adenocarcinoma. Metastatic disease related to cecal adenocarcinoma is considered unlikely given the appearance/configuration.  No evidence of hypermetabolic thoracic lymphadenopathy.  ABDOMEN/PELVIS  No abnormal hypermetabolic activity within the liver, pancreas, adrenal glands, or spleen.  No hypermetabolic lymph nodes in the abdomen or pelvis.  Status post right hemicolectomy with ileocecal anastomosis in the right mid abdomen. Associated hypermetabolism along the surgical anastomosis, max SUV 5.7.  Postsurgical changes along the anterior abdominal wall with associated mild hypermetabolism, max SUV 5.5.  SKELETON  No focal hypermetabolic activity to suggest skeletal metastasis.  IMPRESSION: Status post right hemicolectomy.  No findings specific for metastatic disease. Associated hypermetabolism along the ileocolic anastomosis and along the anterior abdominal wall, postsurgical.  2.4 x 2.6 cm hypermetabolic subsolid pulmonary nodule in the superior segment right lower lobe, worrisome for primary  bronchogenic neoplasm, likely adenocarcinoma. Metastatic disease is considered unlikely given the appearance/configuration. Consider percutaneous biopsy and/or surgical excision.   Electronically Signed   By: Julian Hy M.D.   On: 08/19/2014 12:42   Dg Chest Port 1 View  09/12/2014   CLINICAL DATA:  History of lung malignancy ; status post right thoracotomy.  EXAM: PORTABLE CHEST - 1 VIEW  COMPARISON:  Portable chest x-ray of Sep 11, 2014  FINDINGS: The lungs are adequately inflated. The interstitial markings remain mildly increased especially on the right. No pneumothorax is evident today. The right-sided chest tube tip projects just inferior  to the medial border of the posterior aspect of the right fifth rib. The right internal jugular venous catheter tip projects over the midportion of the SVC. The cardiopericardial silhouette remains enlarged. The pulmonary vascularity is mildly prominent centrally. There is no pleural effusion.  IMPRESSION: No right-sided pneumothorax is evident. Mild interval improvement in the appearance of the pulmonary interstitium bilaterally.   Electronically Signed   By: David  Martinique M.D.   On: 09/12/2014 07:51    Discharge Medications:   Medication List    TAKE these medications        B-D ULTRAFINE III SHORT PEN 31G X 8 MM Misc  Generic drug:  Insulin Pen Needle     carvedilol 25 MG tablet  Commonly known as:  COREG  Take 1 tablet (25 mg total) by mouth 2 (two) times daily with a meal.     docusate sodium 100 MG capsule  Commonly known as:  COLACE  Take 1 capsule (100 mg total) by mouth 2 (two) times daily.     furosemide 20 MG tablet  Commonly known as:  LASIX  Take 20 mg by mouth daily as needed for fluid.     insulin glargine 100 UNIT/ML injection  Commonly known as:  LANTUS  Inject 0.08 mLs (8 Units total) into the skin at bedtime.     insulin lispro 100 UNIT/ML injection  Commonly known as:  HUMALOG  Inject 2-6 Units into the skin 3 (three) times daily before meals.     isosorbide mononitrate 30 MG 24 hr tablet  Commonly known as:  IMDUR  Take 1 tablet (30 mg total) by mouth daily.     levofloxacin 250 MG tablet  Commonly known as:  LEVAQUIN  Take 1 tablet (250 mg total) by mouth daily.     levothyroxine 88 MCG tablet  Commonly known as:  SYNTHROID, LEVOTHROID  Take 88 mcg by mouth daily before breakfast.     lisinopril 20 MG tablet  Commonly known as:  PRINIVIL,ZESTRIL  Take 20 mg by mouth daily.     ONETOUCH VERIO test strip  Generic drug:  glucose blood     oxyCODONE 5 MG immediate release tablet  Commonly known as:  Oxy IR/ROXICODONE  Take 1-2 tablets (5-10 mg  total) by mouth every 6 (six) hours as needed for severe pain.     polyethylene glycol packet  Commonly known as:  MIRALAX / GLYCOLAX  Take 17 g by mouth daily as needed for mild constipation.        Follow Up Appointments:     Follow-up Information    Follow up with Melrose Nakayama, MD On 10/07/2014.   Specialty:  Cardiothoracic Surgery   Why:  PA/LAT CXR to be taken (at [facility] which is in the same building as Dr. Leonarda Salon office) on 10/07/2014 at 2:30 pm ;Appointment time is at 3:30 pm  Contact information:   [address] [place] [address] [phone]       Follow up with Nurse On 09/24/2014.   Why:  Appointment is for chest tube suture removal only and is at 10:00 am   Contact information:   [address] [place] [address] [phone]      Signed: Lars Pinks MPA-C 09/12/2014, 11:56 AM

## 2014-09-12 NOTE — Discharge Instructions (Signed)
°  ACTIVITY:  1.Increase activity slowly. 2.Walk daily and increase frequency and duration as tolerates. 3.May walk up steps. 4.No lifting more than ten pounds for two weeks. 5.No driving for two weeks. 6.Avoid straining. 7.STOP any activity that causes chest pain, shortness of breath, dizziness,sweating,     or excessive weakness. 8.Continue with breathing exercises daily.  DIET:  Diabetic diet and Low fat, Low salt diet   WOUND:  1.May shower. 2.Clean wounds with mild soap and water.  Call the office at [phone] if any problems arise.   Video-Assisted Thoracic Surgery Care After Refer to this sheet in the next few weeks. These instructions provide you with information on caring for yourself after your procedure. Your caregiver may also give you more specific instructions. Your procedure has been planned according to current medical practices, but problems sometimes occur. Call your caregiver if you have any problems or questions after your procedure. HOME CARE INSTRUCTIONS   Only take over-the-counter or prescription medications as directed.  Only take pain medications (narcotics) as directed.  Do not drive until your caregiver approves. Driving while taking narcotics or soon after surgery can be dangerous, so discuss the specific timing with your caregiver.  Avoid activities that use your chest muscles, such as lifting heavy objects, for at least 3-4 weeks.   Take deep breaths to expand the lungs and to protect against pneumonia.  Do breathing exercises as directed by your caregiver. If you were given an incentive spirometer to help with breathing, use it as directed.  You may resume a normal diet and activities when you feel you are able to or as directed.  Do not take a bath until your caregiver says it is OK. Use the shower instead.   Keep the bandage (dressing) covering the area where the chest tube was inserted (incision site) dry for 48 hours. After 48 hours,  remove the dressing unless there is new drainage.  Remove dressings as directed by your caregiver.  Change dressings if necessary or as directed.  Keep all follow-up appointments. It is important for you to see your caregiver after surgery to discuss appropriate follow-up care and surveillance, if it is necessary. SEEK MEDICAL CARE:  You feel excessive or increasing pain at an incision site.  You notice bleeding, skin irritation, drainage, swelling, or redness at an incision site.  There is a bad smell coming from an incision or dressing.  It feels like your heart is fluttering or beating rapidly.  Your pain medication does not relieve your pain. SEEK IMMEDIATE MEDICAL CARE IF:   You have a fever.   You have chest pain.  You have a rash.  You have shortness of breath.  You have trouble breathing.   You feel weak, lightheaded, dizzy, or faint.  MAKE SURE YOU:   Understand these instructions.   Will watch your condition.   Will get help right away if you are not doing well or get worse. Document Released: 08/06/2012 Document Reviewed: 08/06/2012 [facility] Patient Information 2015 [place]. This information is not intended to replace advice given to you by your health care provider. Make sure you discuss any questions you have with your health care provider.

## 2014-09-12 NOTE — Progress Notes (Addendum)
      [address]       [place] [address]             [phone]       4 Days Post-Op Procedure(s) (LRB): RIGHT VIDEO ASSISTED THORACOSCOPY (Right) RIGHT LOWER LOBE SUPERIOR SEGMENTECTOMY (Right)  Subjective: Patient states her breathing is good but she thinks she has "an infection in her stomach" and states she needs Penicillin.  Objective: Vital signs in last 24 hours: Temp:  [98.2 F (36.8 C)-99.5 F (37.5 C)] 98.2 F (36.8 C) (05/20 0811) Pulse Rate:  [67-70] 70 (05/20 0811) Cardiac Rhythm:  [-] Normal sinus rhythm (05/20 0811) Resp:  [14-22] 16 (05/20 0811) BP: (145-197)/(53-92) 174/74 mmHg (05/20 0811) SpO2:  [95 %-99 %] 96 % (05/20 0811)     Intake/Output from previous day: 05/19 0701 - 05/20 0700 In: 1090 [P.O.:840; I.V.:250] Out: 470 [Urine:300; Chest Tube:170]   Physical Exam:  Cardiovascular: RRR. Pulmonary: Diminished at bases; no rales, wheezes, or rhonchi. Abdomen: Soft,obese, mostly non tender, bowel sounds present. Extremities: Mild bilateral lower extremity edema. Wounds: Clean and dry.  No erythema or signs of infection. Chest Tube: to water seal, no air leak  Lab Results: CBC: Recent Labs  09/10/14 0339 09/12/14 0615  WBC 9.2 6.4  HGB 9.4* 8.9*  HCT 29.9* 27.5*  PLT 228 249   BMET:  Recent Labs  09/11/14 0330 09/12/14 0615  NA 138 137  K 4.4 4.5  CL 109 108  CO2 22 24  GLUCOSE 103* 143*  BUN 29* 25*  CREATININE 2.15* 1.97*  CALCIUM 8.4* 8.8*    PT/INR: No results for input(s): LABPROT, INR in the last 72 hours. ABG:  INR: Will add last result for INR, ABG once components are confirmed Will add last 4 CBG results once components are confirmed  Assessment/Plan:  1. CV - SR in the 70's. Hypertensive. On Imdur 30 mg daily and Lisinopril 20 mg daily, and Coreg 25 mg bid. 2.  Pulmonary - On room air. Chest tube 170 cc of output last 24 hours. There is no air leak. Possibly remove CT today or in am. CXR  shows no pneumothorax. Encourage incentive spirometer.  3. DM-CBGs 131/200/157. On Insulin. 4. ABL anemia-H and H decreased to 8.9 and 27.5 5. CKD-Creatinine decreased from 2.15 to 1.97 6. Will add daily Protonix  ZIMMERMAN,DONIELLE MPA-C 09/12/2014,9:37 AM  Patient seen and examined, agree with above Dc chest tube Continue levafloxacin for presumed UTI Possibly home in AM  Vayas C. Roxan Hockey, MD [facility] [phone]

## 2014-09-13 ENCOUNTER — Inpatient Hospital Stay (HOSPITAL_COMMUNITY): Payer: Medicare HMO

## 2014-09-13 LAB — GLUCOSE, CAPILLARY
GLUCOSE-CAPILLARY: 138 mg/dL — AB (ref 65–99)
Glucose-Capillary: 157 mg/dL — ABNORMAL HIGH (ref 65–99)

## 2014-09-13 LAB — URINE CULTURE
COLONY COUNT: NO GROWTH
Culture: NO GROWTH
SPECIAL REQUESTS: NORMAL

## 2014-09-13 MED ORDER — LEVOFLOXACIN 250 MG PO TABS: 250.0000 mg | ORAL_TABLET | Freq: Every day | ORAL | Status: DC

## 2014-09-13 MED ORDER — OXYCODONE HCL 5 MG PO TABS: 5.0000 mg | ORAL_TABLET | Freq: Four times a day (QID) | ORAL | Status: DC | PRN

## 2014-09-13 NOTE — Progress Notes (Signed)
Fentanyl PCA discontinued.  410 mcg (41m) wasted. TNoreene Larsson RN witnessed waste.

## 2014-09-13 NOTE — Progress Notes (Signed)
Patient discharge instructions reviewed with patient and her husband who was at bedside. All questions were answered and the patient had no additional questions at the time of discharge. Prescription handed to husband with patients consent. VSS. Pt in no acute distress. Discharged via wheelchair.

## 2014-09-13 NOTE — Progress Notes (Signed)
[facility] DAILY ICU PROGRESS NOTE                   [address]            [place] [address]          [phone]   5 Days Post-Op Procedure(s) (LRB): RIGHT VIDEO ASSISTED THORACOSCOPY (Right) RIGHT LOWER LOBE SUPERIOR SEGMENTECTOMY (Right)  Total Length of Stay:  LOS: 5 days   Subjective: Feels fine, ambulating, getting stronger   Objective: Vital signs in last 24 hours: Temp:  [98.4 F (36.9 C)-99.4 F (37.4 C)] 98.8 F (37.1 C) (05/21 0700) Pulse Rate:  [70-74] 74 (05/20 1724) Cardiac Rhythm:  [-] Normal sinus rhythm (05/21 0800) Resp:  [12-21] 18 (05/21 0800) BP: (107-186)/(46-85) 159/75 mmHg (05/21 0737) SpO2:  [94 %-98 %] 97 % (05/21 0800)  Filed Weights   09/08/14 0552 09/09/14 0600  Weight: 224 lb (101.606 kg) 230 lb 9.6 oz (104.6 kg)    Weight change:    Hemodynamic parameters for last 24 hours:    Intake/Output from previous day: 05/20 0701 - 05/21 0700 In: 480 [P.O.:480] Out: 850 [Urine:850]  Intake/Output this shift: Total I/O In: -  Out: 450 [Urine:450]  Current Meds: Scheduled Meds: . acetaminophen  1,000 mg Oral 4 times per day   Or  . acetaminophen (TYLENOL) oral liquid 160 mg/5 mL  1,000 mg Oral 4 times per day  . bisacodyl  10 mg Oral Daily  . carvedilol  25 mg Oral BID WC  . enoxaparin (LOVENOX) injection  30 mg Subcutaneous Daily  . fentaNYL   Intravenous 6 times per day  . guaiFENesin  1,200 mg Oral BID  . insulin aspart  0-9 Units Subcutaneous TID WC  . insulin aspart  3 Units Subcutaneous TID WC  . insulin glargine  8 Units Subcutaneous QHS  . isosorbide mononitrate  30 mg Oral Daily  . levofloxacin  250 mg Oral Daily  . levothyroxine  88 mcg Oral QAC breakfast  . lisinopril  20 mg Oral Daily  . pantoprazole  40 mg Oral Daily  . senna-docusate  1 tablet Oral QHS   Continuous Infusions: . sodium chloride 50 mL/hr at 09/10/14 2300   PRN Meds:.diphenhydrAMINE **OR** diphenhydrAMINE, furosemide, hydrALAZINE,  labetalol, naloxone **AND** sodium chloride, ondansetron (ZOFRAN) IV, oxyCODONE, polyethylene glycol, potassium chloride, traMADol  General appearance: alert, cooperative and no distress Heart: regular rate and rhythm Lungs: clear to auscultation bilaterally Abdomen: benign Wound: incis healing well  Lab Results: CBC: Recent Labs  09/12/14 0615  WBC 6.4  HGB 8.9*  HCT 27.5*  PLT 249   BMET:  Recent Labs  09/11/14 0330 09/12/14 0615  NA 138 137  K 4.4 4.5  CL 109 108  CO2 22 24  GLUCOSE 103* 143*  BUN 29* 25*  CREATININE 2.15* 1.97*  CALCIUM 8.4* 8.8*    PT/INR: No results for input(s): LABPROT, INR in the last 72 hours. Radiology: Dg Chest 1v Repeat Same Day  09/12/2014   CLINICAL DATA:  Status post partial lobectomy of the lung. Encounter for chest tube removal.  EXAM: CHEST - 1 VIEW SAME DAY  COMPARISON:  09/12/2014  FINDINGS: Right IJ central line tip overlies the level of superior vena cava. Right chest tube has been removed. Following removal of right chest tube there is a pneumothorax measuring approximately 10% of lung volume on the right. Mild right lower lobe atelectasis. Left lung is clear.  IMPRESSION: 1. Right chest  tube removal. 2. 10% right pneumothorax. 3. Critical Value/emergent results were called by telephone at the time of interpretation on 09/12/2014 at 6:35 pm to Dr. Modesto Charon , who verbally acknowledged these results.   Electronically Signed   By: Nolon Nations M.D.   On: 09/12/2014 18:35   Dg Chest Port 1 View  09/12/2014   CLINICAL DATA:  History of lung malignancy ; status post right thoracotomy.  EXAM: PORTABLE CHEST - 1 VIEW  COMPARISON:  Portable chest x-ray of Sep 11, 2014  FINDINGS: The lungs are adequately inflated. The interstitial markings remain mildly increased especially on the right. No pneumothorax is evident today. The right-sided chest tube tip projects just inferior to the medial border of the posterior aspect of the right fifth  rib. The right internal jugular venous catheter tip projects over the midportion of the SVC. The cardiopericardial silhouette remains enlarged. The pulmonary vascularity is mildly prominent centrally. There is no pleural effusion.  IMPRESSION: No right-sided pneumothorax is evident. Mild interval improvement in the appearance of the pulmonary interstitium bilaterally.   Electronically Signed   By: David  Martinique M.D.   On: 09/12/2014 07:51     Assessment/Plan: S/P Procedure(s) (LRB): RIGHT VIDEO ASSISTED THORACOSCOPY (Right) RIGHT LOWER LOBE SUPERIOR SEGMENTECTOMY (Right) Plan for discharge: see discharge orders     Estle Huguley E 09/13/2014 10:04 AM

## 2014-09-13 NOTE — Progress Notes (Signed)
PT Cancellation Note  Patient Details Name: Teresa Flowers MRN: [id number] DOB: 05/17/1940   Cancelled Treatment:    Reason Eval/Treat Not Completed: PT screened, no needs identified, will sign off Spoke with RN.  Patient's discharge ordered.  Per notes and nursing, patient ambulating on unit.  No acute PT needs identified.  Despina Pole 09/13/2014, 3:40 PM Carita Pian. Sanjuana Kava, [place] Pager [phone]

## 2014-09-23 ENCOUNTER — Ambulatory Visit (INDEPENDENT_AMBULATORY_CARE_PROVIDER_SITE_OTHER): Payer: Self-pay | Admitting: *Deleted

## 2014-09-23 DIAGNOSIS — C3431 Malignant neoplasm of lower lobe, right bronchus or lung: Secondary | ICD-10-CM

## 2014-09-23 DIAGNOSIS — Z9889 Other specified postprocedural states: Secondary | ICD-10-CM

## 2014-09-23 NOTE — Progress Notes (Signed)
Teresa Flowers returns for the removal of three sutures from previous chest tube sites. The mini VATS thoracotomy site as well as the chest tube sites are very well healed. She is walking unassisted, but slowly.  She will return as scheduled with a chest xray.

## 2014-09-30 ENCOUNTER — Other Ambulatory Visit: Payer: Medicare Other

## 2014-09-30 ENCOUNTER — Ambulatory Visit: Payer: Medicare Other

## 2014-09-30 ENCOUNTER — Ambulatory Visit: Payer: Medicare Other | Admitting: Internal Medicine

## 2014-09-30 ENCOUNTER — Other Ambulatory Visit: Payer: Self-pay | Admitting: Internal Medicine

## 2014-09-30 DIAGNOSIS — C3431 Malignant neoplasm of lower lobe, right bronchus or lung: Secondary | ICD-10-CM | POA: Insufficient documentation

## 2014-10-06 ENCOUNTER — Other Ambulatory Visit: Payer: Self-pay | Admitting: Thoracic Surgery (Cardiothoracic Vascular Surgery)

## 2014-10-06 DIAGNOSIS — C3431 Malignant neoplasm of lower lobe, right bronchus or lung: Secondary | ICD-10-CM

## 2014-10-07 ENCOUNTER — Encounter: Payer: Self-pay | Admitting: Thoracic Surgery (Cardiothoracic Vascular Surgery)

## 2014-10-07 ENCOUNTER — Ambulatory Visit
Admission: RE | Admit: 2014-10-07 | Discharge: 2014-10-07 | Disposition: A | Discharge status: Home / Self Care | Payer: Medicare HMO | Source: Ambulatory Visit | Attending: Thoracic Surgery (Cardiothoracic Vascular Surgery) | Admitting: Thoracic Surgery (Cardiothoracic Vascular Surgery)

## 2014-10-07 ENCOUNTER — Ambulatory Visit (INDEPENDENT_AMBULATORY_CARE_PROVIDER_SITE_OTHER): Payer: Self-pay | Admitting: Thoracic Surgery (Cardiothoracic Vascular Surgery)

## 2014-10-07 VITALS — BP 143/83 | HR 72 | Resp 18 | Ht 65.0 in | Wt 230.0 lb

## 2014-10-07 DIAGNOSIS — C3431 Malignant neoplasm of lower lobe, right bronchus or lung: Secondary | ICD-10-CM

## 2014-10-07 NOTE — Progress Notes (Signed)
[address]       ,[place] [address]             [phone]       HPI:  Teresa Flowers returns today for a scheduled postoperative follow-up visit.  She is a 74 year old woman from [facility] who had a right lower lobe superior segmentectomy for stage IA non-small cell cancer on 09/08/2014. Her postoperative course was unremarkable.  She says that she fell about 4 days ago. She is currently using a walker. She is not having any significant incisional pain. She denies shortness of breath.   Past Medical History  Diagnosis Date  . Essential hypertension   . Vertigo   . Hypothyroid   . High cholesterol   . Type 2 diabetes mellitus, uncontrolled, with renal complications   . Anemia in chronic kidney disease   . Stage III chronic kidney disease   . Cerebrovascular disease     prior infarcts seen on head CT per patient  . Obesity   . Hypertensive urgency 12/16/2013  . Refusal of blood product   . TIA (transient ischemic attack)   . GERD (gastroesophageal reflux disease)   . Intermediate coronary syndrome   . Stroke     Tia  . Headache   . Cancer     colon cancer      Current Outpatient Prescriptions  Medication Sig Dispense Refill  . B-D ULTRAFINE III SHORT PEN 31G X 8 MM MISC   3  . carvedilol (COREG) 25 MG tablet Take 1 tablet (25 mg total) by mouth 2 (two) times daily with a meal. 60 tablet 0  . docusate sodium (COLACE) 100 MG capsule Take 1 capsule (100 mg total) by mouth 2 (two) times daily. 60 capsule 0  . furosemide (LASIX) 20 MG tablet Take 20 mg by mouth daily as needed for fluid.     Marland Kitchen insulin glargine (LANTUS) 100 UNIT/ML injection Inject 0.08 mLs (8 Units total) into the skin at bedtime. 10 mL 11  . insulin lispro (HUMALOG) 100 UNIT/ML injection Inject 2-6 Units into the skin 3 (three) times daily before meals.     . isosorbide mononitrate (IMDUR) 30 MG 24 hr tablet Take 1 tablet (30 mg total) by mouth daily. 30 tablet 0  . levofloxacin  (LEVAQUIN) 250 MG tablet Take 1 tablet (250 mg total) by mouth daily. 5 tablet 0  . levothyroxine (SYNTHROID, LEVOTHROID) 88 MCG tablet Take 88 mcg by mouth daily before breakfast.    . lisinopril (PRINIVIL,ZESTRIL) 20 MG tablet Take 20 mg by mouth daily.  0  . ONETOUCH VERIO test strip   6  . oxyCODONE (OXY IR/ROXICODONE) 5 MG immediate release tablet Take 1-2 tablets (5-10 mg total) by mouth every 6 (six) hours as needed for severe pain. 50 tablet 0  . polyethylene glycol (MIRALAX / GLYCOLAX) packet Take 17 g by mouth daily as needed for mild constipation. (Patient taking differently: Take 17 g by mouth daily. ) 14 each 0   No current facility-administered medications for this visit.    Physical Exam BP 143/83 mmHg  Pulse 72  Resp 18  Ht '5\' 5"'$  (1.651 m)  Wt 230 lb (104.327 kg)  BMI 38.27 kg/m2  SpO2 96% Morbidly obese 74 year old woman in no acute distress Alert and oriented 3 with no focal deficits Lungs clear with equal breath sounds bilaterally Cardiac regular rate and rhythm normal S1 and S2 Incisions healing well  Diagnostic Tests:  Chest x-ray reviewed. Shows normal postoperative appearance. There is no effusion.  Impression: Teresa Flowers is a 74 year old woman who recently underwent a thoracoscopic right lower lobe superior segmentectomy for stage IA non-small cell carcinoma. She is recovering well. She has minimal incisional pain and is not having any shortness of breath. She has not been using the pain medication. She did have a fall a few days ago and currently is using a walker.  She should not need any adjuvant chemotherapy, but I did recommend that she see an oncologist. She says that she has an appointment with Dr. Joetta Manners soon and would like to talk to him about it. I will defer that referral to him, but would be happy to arrange it if he so desires.  From a surgical standpoint there are no restrictions on her activities. Obviously given her recent fall she is to be  extremely careful and continue to use a walker for now.   Plan: I will plan to see her back in 5 months for 6 month follow-up. I'll plan to do a CT of the chest that time  Melrose Nakayama, MD [facility] [phone]

## 2015-02-18 ENCOUNTER — Other Ambulatory Visit: Payer: Self-pay | Admitting: *Deleted

## 2015-02-18 DIAGNOSIS — R911 Solitary pulmonary nodule: Secondary | ICD-10-CM

## 2015-02-18 DIAGNOSIS — C3431 Malignant neoplasm of lower lobe, right bronchus or lung: Secondary | ICD-10-CM

## 2015-03-11 ENCOUNTER — Encounter: Payer: Self-pay | Admitting: *Deleted

## 2015-03-17 ENCOUNTER — Other Ambulatory Visit: Payer: Medicare HMO

## 2015-03-17 ENCOUNTER — Encounter: Payer: Medicare HMO | Admitting: Thoracic Surgery (Cardiothoracic Vascular Surgery)

## 2015-03-24 ENCOUNTER — Encounter: Payer: Medicare HMO | Admitting: Thoracic Surgery (Cardiothoracic Vascular Surgery)

## 2015-03-31 ENCOUNTER — Encounter: Payer: Self-pay | Admitting: Thoracic Surgery (Cardiothoracic Vascular Surgery)

## 2015-03-31 ENCOUNTER — Ambulatory Visit
Admission: RE | Admit: 2015-03-31 | Discharge: 2015-03-31 | Disposition: A | Discharge status: Home / Self Care | Payer: Medicare Other | Source: Ambulatory Visit | Attending: Thoracic Surgery (Cardiothoracic Vascular Surgery) | Admitting: Thoracic Surgery (Cardiothoracic Vascular Surgery)

## 2015-03-31 ENCOUNTER — Ambulatory Visit (INDEPENDENT_AMBULATORY_CARE_PROVIDER_SITE_OTHER): Payer: Medicare Other | Admitting: Thoracic Surgery (Cardiothoracic Vascular Surgery)

## 2015-03-31 VITALS — BP 180/85 | HR 67 | Resp 20 | Ht 65.0 in | Wt 226.0 lb

## 2015-03-31 DIAGNOSIS — C189 Malignant neoplasm of colon, unspecified: Secondary | ICD-10-CM

## 2015-03-31 DIAGNOSIS — C3431 Malignant neoplasm of lower lobe, right bronchus or lung: Secondary | ICD-10-CM | POA: Diagnosis not present

## 2015-03-31 DIAGNOSIS — R911 Solitary pulmonary nodule: Secondary | ICD-10-CM

## 2015-03-31 NOTE — Progress Notes (Signed)
[address]       [place] [address]             [phone]       HPI:  Teresa Flowers returns today for a scheduled 6 month follow-up visit.  She is a 74 year old woman from [facility] who had a right lower lobe superior segmentectomy for stage IA non-small cell cancer on 09/08/2014. Her postoperative course was unremarkable.  Her past medical history is significant for hypertension, hypothyroidism, hyperlipidemia, complicated type 2 diabetes, stage III chronic kidney disease, prior stroke, cerebrovascular disease, obesity, reflux, and colon cancer treated with a right hemicolectomy in March 2016.   She feels well. Her appetite is good. Denies significant weight loss. She's not having any chest pain, pressure, or tightness. She denies shortness of breath or wheezing. She has no occasional cough and sneezing due to allergies. She denies hemoptysis. Says she has not had any further GI bleeding since her surgery. She says "there is not anything wrong with me."    Past Medical History  Diagnosis Date  . Essential hypertension   . Vertigo   . Hypothyroid   . High cholesterol   . Type 2 diabetes mellitus, uncontrolled, with renal complications ([place])   . Anemia in chronic kidney disease   . Stage III chronic kidney disease   . Cerebrovascular disease     prior infarcts seen on head CT per patient  . Obesity   . Hypertensive urgency 12/16/2013  . Refusal of blood product   . TIA (transient ischemic attack)   . GERD (gastroesophageal reflux disease)   . Intermediate coronary syndrome ([place])   . Stroke ([place])     Tia  . Headache   . Cancer [facility])     colon cancer    Current Outpatient Prescriptions  Medication Sig Dispense Refill  . B-D ULTRAFINE III SHORT PEN 31G X 8 MM MISC   3  . carvedilol (COREG) 25 MG tablet Take 1 tablet (25 mg total) by mouth 2 (two) times daily with a meal. 60 tablet 0  . docusate sodium (COLACE) 100 MG capsule Take 1 capsule (100  mg total) by mouth 2 (two) times daily. 60 capsule 0  . furosemide (LASIX) 20 MG tablet Take 20 mg by mouth daily as needed for fluid.     Marland Kitchen insulin glargine (LANTUS) 100 UNIT/ML injection Inject 0.08 mLs (8 Units total) into the skin at bedtime. 10 mL 11  . insulin lispro (HUMALOG) 100 UNIT/ML injection Inject 2-6 Units into the skin 3 (three) times daily before meals.     . isosorbide mononitrate (IMDUR) 30 MG 24 hr tablet Take 1 tablet (30 mg total) by mouth daily. 30 tablet 0  . levothyroxine (SYNTHROID, LEVOTHROID) 88 MCG tablet Take 88 mcg by mouth daily before breakfast.    . lisinopril (PRINIVIL,ZESTRIL) 20 MG tablet Take 20 mg by mouth daily.  0  . ONETOUCH VERIO test strip   6  . polyethylene glycol (MIRALAX / GLYCOLAX) packet Take 17 g by mouth daily as needed for mild constipation. (Patient taking differently: Take 17 g by mouth daily. ) 14 each 0   No current facility-administered medications for this visit.    Physical Exam BP 180/85 mmHg  Pulse 67  Resp 20  Ht '5\' 5"'$  (1.651 m)  Wt 226 lb (102.513 kg)  BMI 37.61 kg/m2  SpO63 37% 74 year old woman in no acute distress Moves very slowly No cervical or  supraclavicular adenopathy Lungs diminished at both bases otherwise clear Incision well-healed Cardiac regular rate and rhythm normal S1 and S2  Diagnostic Tests: CT CHEST WITHOUT CONTRAST  TECHNIQUE: Multidetector CT imaging of the chest was performed following the standard protocol without IV contrast.  COMPARISON: PET CT 08/19/2014  FINDINGS: Postoperative changes in the superior segment of the right lower lobe. Linear areas of scarring at the postsurgical site. No evidence of recurrent or residual disease. No pleural effusion. No confluent opacities or pulmonary nodules within the lungs.  Heart is upper limits normal in size. Aorta is normal caliber. No mediastinal, hilar, or axillary adenopathy. Chest wall soft tissues are unremarkable. Imaging into the  upper abdomen shows no acute findings.  No acute bony abnormality or focal bone lesion.  IMPRESSION: Postoperative changes on the right. No evidence of recurrent or residual disease. No acute findings.   Electronically Signed  By: Rolm Baptise M.D.  On: 03/31/2015 10:59  I personally reviewed the CT images and concur with the findings noted above  Impression: 74 year old woman who had a thoracoscopic right lower lobe superior segmentectomy for a stage IA non-small cell carcinoma 6 months ago. This was on the heels of a resection for colon cancer.  She currently is not having any issues related to her thoracic surgery.  Her CT scan shows no evidence of recurrent disease  She would not allow me to refer her to an oncologist but said she would talk to Dr. Joetta Manners about it. She thinks she saw an oncologist but doesn't remember their name.   Hypertension- her blood pressure was markedly elevated today. I recommended that she contact Dr. Joetta Manners and set up an appointment to have that rechecked in the near future.  Plan: Return in 6 months with CT chest for 1 year follow up  I spent 15 minutes face-to-face with Mrs. Crespo during this visit, greater than 50% was spent in counseling Melrose Nakayama, MD [facility] [phone]

## 2015-06-25 ENCOUNTER — Encounter: Payer: Self-pay | Admitting: Internal Medicine

## 2015-06-26 IMAGING — CT NM PET TUM IMG INITIAL (PI) SKULL BASE T - THIGH
1 of 7 series · 3 of 25 positions shown · non-contrast
Comparison: CT chest abdomen pelvis dated 07/08/2014

CLINICAL DATA: Initial treatment strategy for solitary pulmonary
nodule. Cecal mass with stage II colon cancer status post resection.

EXAM:
NUCLEAR MEDICINE PET SKULL BASE TO THIGH
TECHNIQUE: 11.32 mCi F-18 FDG was injected intravenously. Full-ring PET imaging
was performed from the skull base to thigh after the radiotracer. CT
data was obtained and used for attenuation correction and anatomic
localization.
FASTING BLOOD GLUCOSE:  Value: 119 mg/dl

[Series 4: ct sk_thigh 5.0 b31f · axial · 5.0mm · 0.98mm/px · z∈[-1243,-323]mm · 3 of 231 slices shown]
[im 1/231  brain]
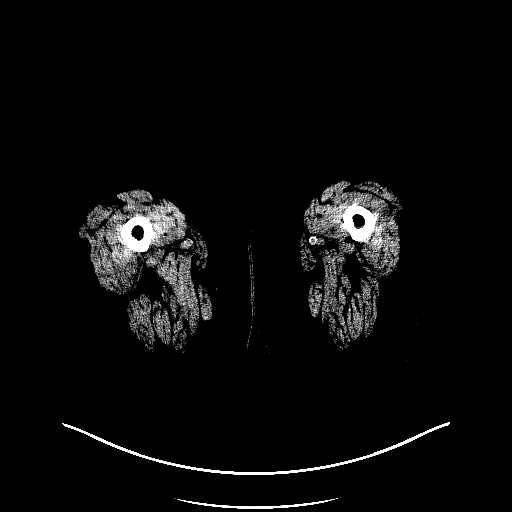
[im 185/231  brain]
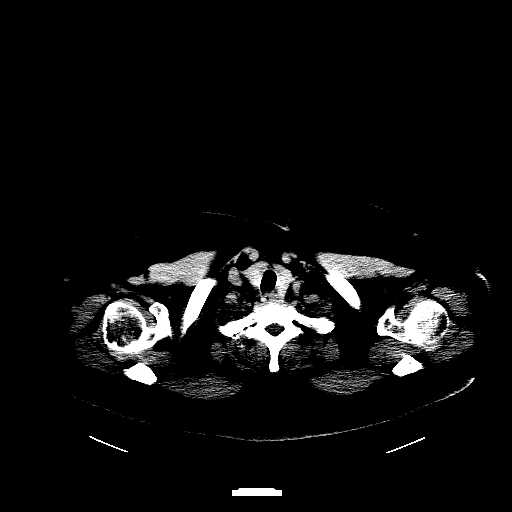
[im 231/231  brain]
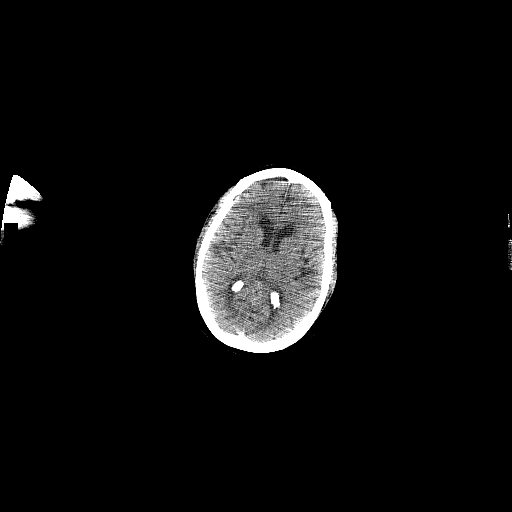

[3 of 25 positions shown; findings below may reference images not displayed]

FINDINGS: NECK

No hypermetabolic lymph nodes in the neck.

Mild hypermetabolism within the bilateral thyroid gland,
nonspecific.

CHEST

[DATE] x 2.6 cm subsolid pulmonary nodule in the superior segment right
lower lobe (series 6/ image 26), unchanged from prior CT chest, max
SUV 2.8. This appearance is worrisome for primary bronchogenic
neoplasm, likely adenocarcinoma. Metastatic disease related to cecal
adenocarcinoma is considered unlikely given the
appearance/configuration.

No evidence of hypermetabolic thoracic lymphadenopathy.

ABDOMEN/PELVIS

No abnormal hypermetabolic activity within the liver, pancreas,
adrenal glands, or spleen.

No hypermetabolic lymph nodes in the abdomen or pelvis.

Status post right hemicolectomy with ileocecal anastomosis in the
right mid abdomen. Associated hypermetabolism along the surgical
anastomosis, max SUV 5.7.

Postsurgical changes along the anterior abdominal wall with
associated mild hypermetabolism, max SUV 5.5.

SKELETON

No focal hypermetabolic activity to suggest skeletal metastasis.
IMPRESSION: Status post right hemicolectomy.

No findings specific for metastatic disease. Associated
hypermetabolism along the ileocolic anastomosis and along the
anterior abdominal wall, postsurgical.

2.4 x 2.6 cm hypermetabolic subsolid pulmonary nodule in the
superior segment right lower lobe, worrisome for primary
bronchogenic neoplasm, likely adenocarcinoma. Metastatic disease is
considered unlikely given the appearance/configuration. Consider
percutaneous biopsy and/or surgical excision.

## 2015-09-23 ENCOUNTER — Other Ambulatory Visit: Payer: Self-pay | Admitting: Thoracic Surgery (Cardiothoracic Vascular Surgery)

## 2015-09-23 DIAGNOSIS — C3431 Malignant neoplasm of lower lobe, right bronchus or lung: Secondary | ICD-10-CM

## 2015-10-13 ENCOUNTER — Other Ambulatory Visit: Payer: Medicare Other

## 2015-10-13 ENCOUNTER — Ambulatory Visit: Payer: Medicare Other | Admitting: Thoracic Surgery (Cardiothoracic Vascular Surgery)

## 2015-11-03 ENCOUNTER — Ambulatory Visit (INDEPENDENT_AMBULATORY_CARE_PROVIDER_SITE_OTHER): Payer: Medicare Other | Admitting: Thoracic Surgery (Cardiothoracic Vascular Surgery)

## 2015-11-03 ENCOUNTER — Encounter: Payer: Self-pay | Admitting: Thoracic Surgery (Cardiothoracic Vascular Surgery)

## 2015-11-03 ENCOUNTER — Ambulatory Visit
Admission: RE | Admit: 2015-11-03 | Discharge: 2015-11-03 | Disposition: A | Discharge status: Home / Self Care | Payer: Medicare Other | Source: Ambulatory Visit | Attending: Thoracic Surgery (Cardiothoracic Vascular Surgery) | Admitting: Thoracic Surgery (Cardiothoracic Vascular Surgery)

## 2015-11-03 VITALS — BP 120/69 | HR 69 | Resp 16 | Ht 65.0 in | Wt 226.0 lb

## 2015-11-03 DIAGNOSIS — C3431 Malignant neoplasm of lower lobe, right bronchus or lung: Secondary | ICD-10-CM | POA: Diagnosis not present

## 2015-11-03 DIAGNOSIS — Z09 Encounter for follow-up examination after completed treatment for conditions other than malignant neoplasm: Secondary | ICD-10-CM | POA: Diagnosis not present

## 2015-11-03 NOTE — Progress Notes (Signed)
[address]       ,[place] [address]             [phone]       HPI: Teresa Flowers returns today for a scheduled 1 year follow-up visit.  She is a 75 year old woman from [facility] who had a right lower lobe superior segmentectomy for stage IA non-small cell cancer on 09/08/2014. Her postoperative course was unremarkable.  Her past medical history is significant for hypertension, hypothyroidism, hyperlipidemia, complicated type 2 diabetes, stage III chronic kidney disease, prior stroke, cerebrovascular disease, obesity, reflux, and colon cancer treated with a right hemicolectomy in March 2016.   I last saw her in the office in December 2016. She was doing well at that time. She had no evidence recurrent disease.  In the interim since that visit she suffered a stroke. This was treated and managed in [facility] so do not have any records for that. She suffered a left hemiparesis and is currently wheelchair-bound. She has been noted with physical therapy "learning to walk again." Her appetite is good. She has gained some weight. She denies any residual incisional pain. She has not had any problems with shortness of breath or wheezing.  Past Medical History  Diagnosis Date  . Essential hypertension   . Vertigo   . Hypothyroid   . High cholesterol   . Type 2 diabetes mellitus, uncontrolled, with renal complications ([place])   . Anemia in chronic kidney disease   . Stage III chronic kidney disease   . Cerebrovascular disease     prior infarcts seen on head CT per patient  . Obesity   . Hypertensive urgency 12/16/2013  . Refusal of blood product   . TIA (transient ischemic attack)   . GERD (gastroesophageal reflux disease)   . Intermediate coronary syndrome ([place])   . Stroke ([place])     Tia  . Headache   . Cancer [facility])     colon cancer      Current Outpatient Prescriptions  Medication Sig Dispense Refill  . B-D ULTRAFINE III SHORT PEN 31G X 8 MM MISC   3    . carvedilol (COREG) 25 MG tablet Take 1 tablet (25 mg total) by mouth 2 (two) times daily with a meal. 60 tablet 0  . docusate sodium (COLACE) 100 MG capsule Take 1 capsule (100 mg total) by mouth 2 (two) times daily. 60 capsule 0  . furosemide (LASIX) 20 MG tablet Take 20 mg by mouth daily as needed for fluid.     Marland Kitchen insulin glargine (LANTUS) 100 UNIT/ML injection Inject 0.08 mLs (8 Units total) into the skin at bedtime. 10 mL 11  . insulin lispro (HUMALOG) 100 UNIT/ML injection Inject 2-6 Units into the skin 3 (three) times daily before meals.     Marland Kitchen levothyroxine (SYNTHROID, LEVOTHROID) 88 MCG tablet Take 88 mcg by mouth daily before breakfast.    . lisinopril (PRINIVIL,ZESTRIL) 20 MG tablet Take 20 mg by mouth daily.  0  . ONETOUCH VERIO test strip   6  . polyethylene glycol (MIRALAX / GLYCOLAX) packet Take 17 g by mouth daily as needed for mild constipation. (Patient taking differently: Take 17 g by mouth daily. ) 14 each 0  . isosorbide mononitrate (IMDUR) 30 MG 24 hr tablet Take 1 tablet (30 mg total) by mouth daily. 30 tablet 0   No current facility-administered medications for this visit.    Physical Exam BP 120/69 mmHg  Pulse 69  Resp 16  Ht '5\' 5"'$  (1.651 m)  Wt 226 lb (102.513 kg)  BMI 37.61 kg/m2  SpO31 57% 75 year old woman in wheelchair Alert and oriented 3. Left leg weakness Cardiac regular rate and rhythm normal S1 and S2 Lungs clear with equal breath sounds bilaterally  Diagnostic Tests: CT CHEST WITHOUT CONTRAST  TECHNIQUE: Multidetector CT imaging of the chest was performed following the standard protocol without IV contrast.  COMPARISON: Chest CT March 31, 2015; chest radiograph Aug 31, 2015  FINDINGS: Cardiovascular: There is no demonstrable thoracic aortic aneurysm. There are scattered foci of atherosclerotic calcification in the aorta. The visualized great vessels appear unremarkable on this noncontrast enhanced study. There are scattered foci of  coronary artery calcification. Pericardium is not appreciably thickened.  Mediastinum/Nodes: Thyroid appears unremarkable. There are stable small mediastinal lymph nodes without adenopathy by size criteria.  Lungs/Pleura: Postoperative changes noted on the right with areas of scarring, stable, in the superior segment right lower lobe region. Slight scarring is also noted in the posterior segment right upper lobe, stable. There is slight stable bibasilar atelectasis. There is no new parenchymal lung mass. No edema or consolidation.  Upper Abdomen: There is atherosclerotic calcification in the aorta and in the periphery of the left renal artery. Visualized upper abdominal structures otherwise appear unremarkable on this noncontrast study.  Musculoskeletal: There is degenerative change in the thoracic spine with diffuse idiopathic skeletal hyperostosis. There are no blastic or lytic bone lesions. 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  IMPRESSION: Stable scarring on the right without evidence recurrent or new mass. Mild subsegmental atelectasis is noted in the lung bases.  There are foci of atherosclerotic calcification with multiple foci of coronary artery calcification.  No adenopathy.  There is diffuse idiopathic skeletal hyperostosis in the lower thoracic and lumbar spine regions. There is fairly extensive but stable arthropathy in the right sternoclavicular joint. Question prior trauma in this region.   Electronically Signed  By: Lowella Grip III M.D.  On: 11/03/2015 14:42  I reviewed the CT images and concur with the findings as noted above.  Impression: Teresa Flowers is a 75 year old woman with multiple medical problems who is now a little over a year out from a right lower lobe superior segmentectomy for stage IA non-small cell carcinoma.  She has no evidence of recurrent disease on  exam or her recent CT scan.  She refused oncology referral.  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 She is being treated for that in [facility].  Plan: Return in 6 months with CT of chest. I had to reinforce the importance of continued follow-up, despite the fact that she had a complete resection.  Melrose Nakayama, MD [facility] [phone]

## 2016-04-21 ENCOUNTER — Other Ambulatory Visit: Payer: Self-pay | Admitting: Thoracic Surgery (Cardiothoracic Vascular Surgery)

## 2016-04-21 DIAGNOSIS — Z9889 Other specified postprocedural states: Secondary | ICD-10-CM

## 2016-05-17 ENCOUNTER — Other Ambulatory Visit: Payer: Medicare Other

## 2016-05-17 ENCOUNTER — Ambulatory Visit: Payer: Medicare Other | Admitting: Thoracic Surgery (Cardiothoracic Vascular Surgery)

## 2016-08-23 ENCOUNTER — Ambulatory Visit
Admission: RE | Admit: 2016-08-23 | Discharge: 2016-08-23 | Disposition: A | Discharge status: Home / Self Care | Payer: Medicare Other | Source: Ambulatory Visit | Attending: Thoracic Surgery (Cardiothoracic Vascular Surgery) | Admitting: Thoracic Surgery (Cardiothoracic Vascular Surgery)

## 2016-08-23 ENCOUNTER — Ambulatory Visit (INDEPENDENT_AMBULATORY_CARE_PROVIDER_SITE_OTHER): Payer: Medicare Other | Admitting: Thoracic Surgery (Cardiothoracic Vascular Surgery)

## 2016-08-23 VITALS — BP 150/83 | Resp 16 | Ht 65.0 in

## 2016-08-23 DIAGNOSIS — C3431 Malignant neoplasm of lower lobe, right bronchus or lung: Secondary | ICD-10-CM

## 2016-08-23 DIAGNOSIS — Z9889 Other specified postprocedural states: Secondary | ICD-10-CM

## 2016-08-23 DIAGNOSIS — Z09 Encounter for follow-up examination after completed treatment for conditions other than malignant neoplasm: Secondary | ICD-10-CM

## 2016-08-23 NOTE — Progress Notes (Signed)
[address]       [place] [address]             [phone]    HPI: Teresa Flowers returns for a scheduled follow-up visit.  She is a 76 year old woman who has a past medical history remarkable for lung cancer, hypertension, hyperlipidemia, complicated type 2 diabetes, stage III chronic kidney disease, stroke, obesity, gastroesophageal reflux, and colon cancer. She had a right lower lobe superior segmentectomy in May 2016 for a stage IA non-small cell carcinoma.  She quit smoking in 1995.  I last saw her in the office in July 2017. That was a 1 year follow-up visit. She had no evidence recurrent disease. She had had a stroke just prior to that visit.  She denies any wheezing, or shortness of breath. She has had a lot of sneezing and coughing due to allergies recently. Her appetite is good. She has not had any unusual headaches or visual changes. She says she can walk "a little bit". She is in a wheelchair for this visit.  Past Medical History:  Diagnosis Date  . Anemia in chronic kidney disease   . Cancer [facility])    colon cancer  . Cerebrovascular disease    prior infarcts seen on head CT per patient  . Essential hypertension   . GERD (gastroesophageal reflux disease)   . Headache   . High cholesterol   . Hypertensive urgency 12/16/2013  . Hypothyroid   . Intermediate coronary syndrome ([place])   . Obesity   . Refusal of blood product   . Stage III chronic kidney disease   . Stroke ([place])    [place]ia  . TIA (transient ischemic attack)   . Type 2 diabetes mellitus, uncontrolled, with renal complications ([place])   . Vertigo     Current Outpatient Prescriptions  Medication Sig Dispense Refill  . B-D ULTRAFINE III SHORT PEN 31G X 8 MM MISC   3  . carvedilol (COREG) 25 MG tablet Take 1 tablet (25 mg total) by mouth 2 (two) times daily with a meal. 60 tablet 0  . docusate sodium (COLACE) 100 MG capsule Take 1 capsule (100 mg total) by mouth 2 (two) times daily. 60  capsule 0  . furosemide (LASIX) 20 MG tablet Take 20 mg by mouth daily as needed for fluid.     Marland Kitchen insulin glargine (LANTUS) 100 UNIT/ML injection Inject 0.08 mLs (8 Units total) into the skin at bedtime. 10 mL 11  . insulin lispro (HUMALOG) 100 UNIT/ML injection Inject 2-6 Units into the skin 3 (three) times daily before meals.     . isosorbide mononitrate (IMDUR) 30 MG 24 hr tablet Take 1 tablet (30 mg total) by mouth daily. 30 tablet 0  . levothyroxine (SYNTHROID, LEVOTHROID) 88 MCG tablet Take 88 mcg by mouth daily before breakfast.    . lisinopril (PRINIVIL,ZESTRIL) 20 MG tablet Take 20 mg by mouth daily.  0  . ONETOUCH VERIO test strip   6  . polyethylene glycol (MIRALAX / GLYCOLAX) packet Take 17 g by mouth daily as needed for mild constipation. (Patient taking differently: Take 17 g by mouth daily. ) 14 each 0   No current facility-administered medications for this visit.     Physical Exam BP (!) 150/83 (BP Location: Right Arm, Patient Position: Sitting, Cuff Size: Large)   Resp 16   Ht '5\' 5"'$  (1.651 m)   SpO2 96% Comment: ON RA 76 year old woman in no acute distress  Alert and oriented 3 with mild left hemiparesis No cervical or supraclavicular adenopathy Cardiac regular rate and rhythm normal S1 and S2 Lungs clear with equal results bilaterally  Diagnostic Tests: CT CHEST WITHOUT CONTRAST  TECHNIQUE: Multidetector CT imaging of the chest was performed following the standard protocol without IV contrast.  COMPARISON:  11/03/2015  FINDINGS: Cardiovascular: The heart size is normal. No pericardial effusion. Coronary artery calcification is noted. Atherosclerotic calcification is noted in the wall of the thoracic aorta.  Mediastinum/Nodes: Stable small mediastinal lymph nodes. No evidence for gross hilar lymphadenopathy although assessment is limited by the lack of intravenous contrast on today's study. Post treatment changes noted inferior right hilum. The esophagus  has normal imaging features. There is no axillary lymphadenopathy.  Lungs/Pleura: Postsurgical changes noted right lower lobe consistent with history of superior segmentectomy. Lungs stable without new pulmonary nodule or mass. No focal airspace consolidation. No pulmonary edema or pleural effusion.  Upper Abdomen: Unremarkable.  Musculoskeletal: Bone windows reveal no worrisome lytic or sclerotic osseous lesions.  IMPRESSION: 1. Stable postsurgical change right lower lobe. No new or progressive findings on today's study to suggest recurrent or metastatic disease in the chest.   Electronically Signed   By: Misty Stanley M.D.   On: 08/23/2016 16:26 I personally reviewed the CT chest and concur with the findings noted above.  Impression: Teresa Flowers is a 76 year old woman who is now about a year and a half out from a right lower lobe superior segmentectomy for stage IA non-small cell carcinoma. She has no evidence of recurrent disease.  Her primary limitation continues to be left leg weakness from her stroke. She apparently has made progress with physical therapy in that regard.  Plan: I will plan to see her back in 6 months for 2 year follow-up visit. We will do a CT of the chest at that time.  Melrose Nakayama, MD [facility] [phone]

## 2019-09-24 DEATH — deceased
